# Patient Record
Sex: Male | Born: 1950 | Race: White | Hispanic: No | Marital: Married | Smoking: Never smoker
Health system: Southern US, Community
[De-identification: ages and names within clinical notes are randomized; demographics above are authoritative.]

## PROBLEM LIST (undated history)

## (undated) DIAGNOSIS — Z8673 Personal history of transient ischemic attack (TIA), and cerebral infarction without residual deficits: Secondary | ICD-10-CM

## (undated) DIAGNOSIS — I82409 Acute embolism and thrombosis of unspecified deep veins of unspecified lower extremity: Secondary | ICD-10-CM

## (undated) DIAGNOSIS — R591 Generalized enlarged lymph nodes: Secondary | ICD-10-CM

## (undated) DIAGNOSIS — K219 Gastro-esophageal reflux disease without esophagitis: Secondary | ICD-10-CM

## (undated) DIAGNOSIS — I10 Essential (primary) hypertension: Secondary | ICD-10-CM

## (undated) DIAGNOSIS — D649 Anemia, unspecified: Secondary | ICD-10-CM

## (undated) DIAGNOSIS — F32A Depression, unspecified: Secondary | ICD-10-CM

## (undated) DIAGNOSIS — G4730 Sleep apnea, unspecified: Secondary | ICD-10-CM

## (undated) DIAGNOSIS — I2510 Atherosclerotic heart disease of native coronary artery without angina pectoris: Secondary | ICD-10-CM

## (undated) DIAGNOSIS — I77819 Aortic ectasia, unspecified site: Secondary | ICD-10-CM

## (undated) DIAGNOSIS — D6859 Other primary thrombophilia: Secondary | ICD-10-CM

## (undated) DIAGNOSIS — I639 Cerebral infarction, unspecified: Secondary | ICD-10-CM

## (undated) DIAGNOSIS — I2699 Other pulmonary embolism without acute cor pulmonale: Secondary | ICD-10-CM

## (undated) DIAGNOSIS — E871 Hypo-osmolality and hyponatremia: Secondary | ICD-10-CM

## (undated) DIAGNOSIS — I209 Angina pectoris, unspecified: Secondary | ICD-10-CM

## (undated) DIAGNOSIS — I255 Ischemic cardiomyopathy: Secondary | ICD-10-CM

## (undated) DIAGNOSIS — I519 Heart disease, unspecified: Secondary | ICD-10-CM

## (undated) DIAGNOSIS — I5022 Chronic systolic (congestive) heart failure: Secondary | ICD-10-CM

## (undated) DIAGNOSIS — E785 Hyperlipidemia, unspecified: Secondary | ICD-10-CM

## (undated) DIAGNOSIS — F329 Major depressive disorder, single episode, unspecified: Secondary | ICD-10-CM

## (undated) DIAGNOSIS — R29898 Other symptoms and signs involving the musculoskeletal system: Secondary | ICD-10-CM

## (undated) DIAGNOSIS — R29818 Other symptoms and signs involving the nervous system: Secondary | ICD-10-CM

## (undated) HISTORY — PX: PROSTATE SURGERY: SHX751

## (undated) HISTORY — DX: Major depressive disorder, single episode, unspecified: F32.9

## (undated) HISTORY — DX: Gastro-esophageal reflux disease without esophagitis: K21.9

## (undated) HISTORY — PX: TONSILLECTOMY AND ADENOIDECTOMY: SUR1326

## (undated) HISTORY — DX: Depression, unspecified: F32.A

## (undated) HISTORY — DX: Heart disease, unspecified: I51.9

## (undated) HISTORY — PX: OTHER SURGICAL HISTORY: SHX169

## (undated) HISTORY — DX: Hyperlipidemia, unspecified: E78.5

## (undated) HISTORY — PX: APPENDECTOMY: SHX54

---

## 2014-07-29 DIAGNOSIS — Z23 Encounter for immunization: Secondary | ICD-10-CM | POA: Diagnosis not present

## 2014-07-29 DIAGNOSIS — I2510 Atherosclerotic heart disease of native coronary artery without angina pectoris: Secondary | ICD-10-CM | POA: Diagnosis not present

## 2014-07-29 DIAGNOSIS — E785 Hyperlipidemia, unspecified: Secondary | ICD-10-CM | POA: Diagnosis not present

## 2014-07-29 DIAGNOSIS — Z125 Encounter for screening for malignant neoplasm of prostate: Secondary | ICD-10-CM | POA: Diagnosis not present

## 2014-07-29 DIAGNOSIS — K219 Gastro-esophageal reflux disease without esophagitis: Secondary | ICD-10-CM | POA: Diagnosis not present

## 2014-07-29 DIAGNOSIS — Z79899 Other long term (current) drug therapy: Secondary | ICD-10-CM | POA: Diagnosis not present

## 2014-07-29 DIAGNOSIS — I10 Essential (primary) hypertension: Secondary | ICD-10-CM | POA: Diagnosis not present

## 2014-08-07 DIAGNOSIS — I2510 Atherosclerotic heart disease of native coronary artery without angina pectoris: Secondary | ICD-10-CM | POA: Diagnosis not present

## 2014-08-07 DIAGNOSIS — E782 Mixed hyperlipidemia: Secondary | ICD-10-CM | POA: Diagnosis not present

## 2014-08-07 DIAGNOSIS — I452 Bifascicular block: Secondary | ICD-10-CM | POA: Diagnosis not present

## 2014-08-07 DIAGNOSIS — R9439 Abnormal result of other cardiovascular function study: Secondary | ICD-10-CM | POA: Diagnosis not present

## 2014-08-07 DIAGNOSIS — I080 Rheumatic disorders of both mitral and aortic valves: Secondary | ICD-10-CM | POA: Diagnosis not present

## 2015-02-03 DIAGNOSIS — R7301 Impaired fasting glucose: Secondary | ICD-10-CM | POA: Diagnosis not present

## 2015-02-03 DIAGNOSIS — I10 Essential (primary) hypertension: Secondary | ICD-10-CM | POA: Diagnosis not present

## 2015-02-03 DIAGNOSIS — I2510 Atherosclerotic heart disease of native coronary artery without angina pectoris: Secondary | ICD-10-CM | POA: Diagnosis not present

## 2015-02-03 DIAGNOSIS — Z79899 Other long term (current) drug therapy: Secondary | ICD-10-CM | POA: Diagnosis not present

## 2015-02-03 DIAGNOSIS — E785 Hyperlipidemia, unspecified: Secondary | ICD-10-CM | POA: Diagnosis not present

## 2015-05-25 DIAGNOSIS — Z23 Encounter for immunization: Secondary | ICD-10-CM | POA: Diagnosis not present

## 2015-06-10 ENCOUNTER — Ambulatory Visit (INDEPENDENT_AMBULATORY_CARE_PROVIDER_SITE_OTHER): Payer: Medicare Other | Admitting: Family Medicine

## 2015-06-10 ENCOUNTER — Encounter: Payer: Self-pay | Admitting: Family Medicine

## 2015-06-10 VITALS — BP 126/83 | HR 70 | Temp 97.0°F | Ht 73.0 in | Wt 248.2 lb

## 2015-06-10 DIAGNOSIS — I10 Essential (primary) hypertension: Secondary | ICD-10-CM | POA: Diagnosis not present

## 2015-06-10 DIAGNOSIS — E785 Hyperlipidemia, unspecified: Secondary | ICD-10-CM | POA: Diagnosis not present

## 2015-06-10 DIAGNOSIS — K219 Gastro-esophageal reflux disease without esophagitis: Secondary | ICD-10-CM | POA: Diagnosis not present

## 2015-06-10 DIAGNOSIS — F32A Depression, unspecified: Secondary | ICD-10-CM | POA: Insufficient documentation

## 2015-06-10 DIAGNOSIS — F329 Major depressive disorder, single episode, unspecified: Secondary | ICD-10-CM | POA: Diagnosis not present

## 2015-06-10 DIAGNOSIS — M722 Plantar fascial fibromatosis: Secondary | ICD-10-CM

## 2015-06-10 MED ORDER — PREDNISONE 20 MG PO TABS: ORAL_TABLET | ORAL | Status: DC

## 2015-06-10 NOTE — Progress Notes (Signed)
BP 126/83 mmHg  Pulse 70  Temp(Src) 97 F (36.1 C) (Oral)  Ht 6\' 1"  (1.854 m)  Wt 248 lb 3.2 oz (112.583 kg)  BMI 32.75 kg/m2   Subjective:    Patient ID: [name], male    DOB: [birth year], 64 y.o.   MRN: [id number]  HPI: [name] is a 64 y.o. male presenting on 06/10/2015 for Foot Pain   HPI Hypertension Patient presents today for a hypertension check as a new patient to our clinic. Prior to coming to [place] he has been on Norvasc 5 mg and Diovan 320-12.5 mg. His blood pressure for him states pretty controlled on these medications. Patient denies headaches, blurred vision, chest pains, shortness of breath, or weakness. Denies any side effects from medication and is content with current medication.  GERD Patient has had bad reflux in the past that has been well-controlled with omeprazole. Denies any issues with bleeding ulcers or blood in stools or dark tarry stools. Denies any issues or abdominal pain currently.  Depression Patient has been on Lexapro and feels like he does very well on Lexapro. He denies any major side effects from it. He denies any suicidal ideations. He feels like his anxiety and depression are very well controlled while on this medication.  Hyperlipidemia Patient takes Lipitor for hyperlipidemia and does not have any issues with it. Has not had her check for cholesterol and would well and is ready for one today.  Foot pain Patient has pain in his left heel on the sole of his foot extending up into them middle of his foot. Occasionally with this pain he gets a little bit of shooting going up in his calf but that is not the major pain that he has been having. The majority is from the heel. He describes it as a sharp burning pain and is often 8 out of 10. This was been going on for 4-6 weeks. He has attempted Tylenol without much success.  Relevant past medical, surgical, family and social history reviewed and updated as indicated. Interim medical history  since our last visit reviewed. Allergies and medications reviewed and updated.  Review of Systems  Constitutional: Negative for fever.  HENT: Negative for ear discharge and ear pain.   Eyes: Negative for discharge and visual disturbance.  Respiratory: Negative for chest tightness, shortness of breath and wheezing.   Cardiovascular: Negative for chest pain and leg swelling.  Gastrointestinal: Negative for abdominal pain, diarrhea and constipation.  Genitourinary: Negative for difficulty urinating.  Musculoskeletal: Positive for arthralgias. Negative for myalgias, back pain, joint swelling and gait problem.  Skin: Negative for rash.  Neurological: Negative for dizziness, syncope, light-headedness and headaches.  Psychiatric/Behavioral: Negative for suicidal ideas, behavioral problems, sleep disturbance, self-injury, dysphoric mood and agitation. The patient is not nervous/anxious.   All other systems reviewed and are negative.   Per HPI unless specifically indicated above  Social History   Social History  . Marital Status: Married    Spouse Name: N/A  . Number of Children: N/A  . Years of Education: N/A   Occupational History  . Not on file.   Social History Main Topics  . Smoking status: Never Smoker   . Smokeless tobacco: Never Used  . Alcohol Use: Not on file     Comment: occasional  . Drug Use: No  . Sexual Activity: Yes    Birth Control/ Protection: Post-menopausal     Comment: Married for 16 years   Other Topics Concern  .  Not on file   Social History Narrative  . No narrative on file    Past Surgical History  Procedure Laterality Date  . Appendectomy    . Adnoidectomy    . Tonsillectomy and adenoidectomy    . Prostate surgery      Family History  Problem Relation Age of Onset  . COPD Mother   . Heart disease Mother   . AAA (abdominal aortic aneurysm) Mother   . Heart disease Father   . Depression Father   . Diabetes Father   . Heart disease Brother    . Early death Brother     heart attack      Medication List       This list is accurate as of: 06/10/15  9:37 AM.  Always use your most recent med list.               amLODipine 5 MG tablet  Commonly known as:  NORVASC  Take 5 mg by mouth daily.     atorvastatin 80 MG tablet  Commonly known as:  LIPITOR  Take 80 mg by mouth daily.     escitalopram 20 MG tablet  Commonly known as:  LEXAPRO  Take 20 mg by mouth daily.     omeprazole 20 MG capsule  Commonly known as:  PRILOSEC  Take 20 mg by mouth daily.     predniSONE 20 MG tablet  Commonly known as:  DELTASONE  2 po at same time daily for 5 days     valsartan-hydrochlorothiazide 320-12.5 MG tablet  Commonly known as:  DIOVAN-HCT  Take 1 tablet by mouth daily.           Objective:    BP 126/83 mmHg  Pulse 70  Temp(Src) 97 F (36.1 C) (Oral)  Ht 6\' 1"  (1.854 m)  Wt 248 lb 3.2 oz (112.583 kg)  BMI 32.75 kg/m2  Wt Readings from Last 3 Encounters:  06/10/15 248 lb 3.2 oz (112.583 kg)    Physical Exam  Constitutional: He is oriented to person, place, and time. He appears well-developed and well-nourished. No distress.  Eyes: Conjunctivae and EOM are normal. Pupils are equal, round, and reactive to light. Right eye exhibits no discharge. No scleral icterus.  Neck: Neck supple. No thyromegaly present.  Cardiovascular: Normal rate, regular rhythm, normal heart sounds and intact distal pulses.   No murmur heard. Pulmonary/Chest: Effort normal and breath sounds normal. No respiratory distress. He has no wheezes.  Musculoskeletal: Normal range of motion. He exhibits no edema.       Left foot: There is tenderness (tenderness on the plantar surface near the heel extending in the midline of the arch of the foot.). There is normal range of motion, no bony tenderness, no swelling, no crepitus, no deformity and no laceration.  Lymphadenopathy:    He has no cervical adenopathy.  Neurological: He is alert and oriented  to person, place, and time. Coordination normal.  Skin: Skin is warm and dry. No rash noted. He is not diaphoretic.  Psychiatric: He has a normal mood and affect. His behavior is normal.  Vitals reviewed.   No results found for this or any previous visit.    Assessment & Plan:   Problem List Items Addressed This Visit      Cardiovascular and Mediastinum   Essential hypertension, benign - Primary   Relevant Medications   valsartan-hydrochlorothiazide (DIOVAN-HCT) 320-12.5 MG tablet   amLODipine (NORVASC) 5 MG tablet   atorvastatin (LIPITOR) 80  MG tablet     Digestive   GERD (gastroesophageal reflux disease)   Relevant Medications   omeprazole (PRILOSEC) 20 MG capsule     Other   Depression   Relevant Medications   escitalopram (LEXAPRO) 20 MG tablet   Hyperlipidemia LDL goal <130   Relevant Medications   valsartan-hydrochlorothiazide (DIOVAN-HCT) 320-12.5 MG tablet   amLODipine (NORVASC) 5 MG tablet   atorvastatin (LIPITOR) 80 MG tablet    Other Visit Diagnoses    Plantar fasciitis of left foot        Relevant Medications    predniSONE (DELTASONE) 20 MG tablet        Follow up plan: Return in about 2 months (around 08/10/2015), or if symptoms worsen or fail to improve, for f/u htn/ depression.  Caryl Pina, MD [facility] 06/10/2015, 9:37 AM

## 2015-06-16 DIAGNOSIS — M79672 Pain in left foot: Secondary | ICD-10-CM | POA: Diagnosis not present

## 2015-06-16 DIAGNOSIS — M722 Plantar fascial fibromatosis: Secondary | ICD-10-CM | POA: Diagnosis not present

## 2015-07-07 DIAGNOSIS — M722 Plantar fascial fibromatosis: Secondary | ICD-10-CM | POA: Diagnosis not present

## 2015-07-14 ENCOUNTER — Ambulatory Visit: Payer: Medicare Other | Attending: Podiatry | Admitting: Physical Therapy

## 2015-07-14 DIAGNOSIS — M79672 Pain in left foot: Secondary | ICD-10-CM | POA: Insufficient documentation

## 2015-07-14 NOTE — Therapy (Signed)
[facility] [place], [place], [address] Phone: [phone]   Fax:  [phone]  Physical Therapy Evaluation  Patient Details  Name: [name] MRN: [id number] Date of Birth: 10-Dec-1950 Referring Provider: Vonna Kotyk Dettinger MD.  Encounter Date: 07/14/2015      PT End of Session - 07/14/15 1404    Visit Number 1   Number of Visits 12   Date for PT Re-Evaluation 09/01/15   PT Start Time 0110   PT Stop Time 0151   PT Time Calculation (min) 41 min   Activity Tolerance Patient tolerated treatment well   Behavior During Therapy [facility] for tasks assessed/performed      Past Medical History  Diagnosis Date  . Hyperlipidemia   . Hypertension   . Depression   . GERD (gastroesophageal reflux disease)   . Heart disease     some blockage in LAD    Past Surgical History  Procedure Laterality Date  . Appendectomy    . Adnoidectomy    . Tonsillectomy and adenoidectomy    . Prostate surgery      There were no vitals filed for this visit.  Visit Diagnosis:  Left foot pain - Plan: PT plan of care cert/re-cert      Subjective Assessment - 07/14/15 1411    Subjective No pain today but very painful prior to pain medication.   Limitations Walking   Patient Stated Goals Get out of pain.   Currently in Pain? No/denies            [facility] PT Assessment - 07/14/15 0001    Assessment   Medical Diagnosis Left plantar fasciitis.   Referring Provider Caryl Pina MD.   Onset Date/Surgical Date --  10 weeks.   Precautions   Precautions None   Restrictions   Weight Bearing Restrictions No   Balance Screen   Has the patient fallen in the past 6 months No   Has the patient had a decrease in activity level because of a fear of falling?  No   Is the patient reluctant to leave their home because of a fear of falling?  No   Home Ec[employer] residence   Prior Function   Level of Independence  Independent   Posture/Postural Control   Posture Comments Pronation   ROM / Strength   AROM / PROM / Strength AROM;Strength   AROM   Overall AROM Comments Normal left ankle range of motion.   Palpation   Palpation comment Tender to palpation over patient's left foot medial calcaneal tubercle and distal 1/3 of plantar fascia from this region.  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   Ambulation/Gait   Gait Comments Normal gait cycle.  He wears new balance with orthotics.                   [facility] Adult PT Treatment/Exercise - 07/14/15 0001    Modalities   Modalities Ultrasound   Ultrasound   Ultrasound Location Left foot medial calaneal region and plantar fascia   Ultrasound Parameters 1.50 W/CM2 x 8 minutes.   Ultrasound Goals Pain   Manual Therapy   Manual therapy comments IASTM x 8 minutes.                  PT Short Term Goals - 07/14/15 1421    PT SHORT TERM GOAL #1   Title Ind with a HEP.   Time 2  Period Weeks   Status New           PT Long Term Goals - 07/18/2015 1421    PT LONG TERM GOAL #1   Title Walk a community distance with pain not > 2-3/10.   Time 6   Period Weeks   Status New               Plan - 07/18/15 1419    Clinical Impression Statement The patient has had ongoing and worsening left foot pain over the last 10 minths.  Prior to medication his foot pain would easily rise to 7+/10.   Pt will benefit from skilled therapeutic intervention in order to improve on the following deficits Pain;Decreased activity tolerance   Rehab Potential Excellent   PT Frequency 2x / week   PT Duration 6 weeks   PT Treatment/Interventions Therapeutic exercise;Manual techniques;Ultrasound   PT Next Visit Plan IASTM; U/S; Rockerboard; dynadisc.          G-Codes - 07/18/15 1422    Functional Assessment Tool Used FOTO--46% limitation.   Functional Limitation Mobility: Walking and moving around   Mobility: Walking and  Moving Around Current Status [phone]) At least 40 percent but less than 60 percent impaired, limited or restricted       Problem List Patient Active Problem List   Diagnosis Date Noted  . Essential hypertension, benign 06/10/2015  . Depression 06/10/2015  . Hyperlipidemia LDL goal <130 06/10/2015  . GERD (gastroesophageal reflux disease) 06/10/2015    Clancey Welton, [place] MPT 18-Jul-2015, 2:24 PM  [facility] [place], [place], [address] Phone: [phone]   Fax:  [phone]  Name: [name] MRN: [id number] Date of Birth: 11/23/1950

## 2015-07-21 ENCOUNTER — Ambulatory Visit: Payer: Medicare Other | Admitting: *Deleted

## 2015-07-21 DIAGNOSIS — M79672 Pain in left foot: Secondary | ICD-10-CM | POA: Diagnosis not present

## 2015-07-21 NOTE — Therapy (Signed)
[facility] [place], [place], [address] Phone: [phone]   Fax:  [phone]  Physical Therapy Treatment  Patient Details  Name: [name] MRN: [id number] Date of Birth: 10-Jun-1951 Referring Provider: Vonna Kotyk Dettinger MD.  Encounter Date: 07/21/2015      PT End of Session - 07/21/15 1206    Visit Number 2   Number of Visits 12   Date for PT Re-Evaluation 09/01/15   PT Start Time 1115   PT Stop Time 1207   PT Time Calculation (min) 52 min   Activity Tolerance Patient tolerated treatment well   Behavior During Therapy [facility] for tasks assessed/performed      Past Medical History  Diagnosis Date  . Hyperlipidemia   . Hypertension   . Depression   . GERD (gastroesophageal reflux disease)   . Heart disease     some blockage in LAD    Past Surgical History  Procedure Laterality Date  . Appendectomy    . Adnoidectomy    . Tonsillectomy and adenoidectomy    . Prostate surgery      There were no vitals filed for this visit.  Visit Diagnosis:  Left foot pain      Subjective Assessment - 07/21/15 1113    Subjective pain is 3-4/10 today LT foot   Limitations Walking   Patient Stated Goals Get out of pain.                         [facility] Adult PT Treatment/Exercise - 07/21/15 0001    Bed Mobility   Bed Mobility Supine to Sit   Exercises   Exercises Ankle   Modalities   Modalities Ultrasound   Ultrasound   Ultrasound Location LT foot plantar fascia   Ultrasound Parameters 1.5 w/cm2 x12 mins while on and off stretch supine   Ultrasound Goals Pain   Manual Therapy   Manual therapy comments IASTM/ STW to LT foot entire plantar fascia and around heel x 22 minutes.   Ankle Exercises: Standing   Rocker Board 5 minutes  Calf stretching                  PT Short Term Goals - 07/14/15 1421    PT SHORT TERM GOAL #1   Title Ind with a HEP.   Time 2   Period Weeks   Status New            PT Long Term Goals - 07/14/15 1421    PT LONG TERM GOAL #1   Title Walk a community distance with pain not > 2-3/10.   Time 6   Period Weeks   Status New               Plan - 07/21/15 1210    Clinical Impression Statement Pt did great with Rx today, but was very sore along entire medial border of Plantar fascia. He also did well with Calf stretching. Goals are ongoing   Pt will benefit from skilled therapeutic intervention in order to improve on the following deficits Pain;Decreased activity tolerance   Rehab Potential Excellent   PT Frequency 2x / week   PT Duration 6 weeks   PT Treatment/Interventions Therapeutic exercise;Manual techniques;Ultrasound   PT Next Visit Plan IASTM; U/S; Rockerboard; dynadisc.   Consulted and Agree with Plan of Care Patient        Problem List Patient Active Problem List   Diagnosis Date Noted  .  Essential hypertension, benign 06/10/2015  . Depression 06/10/2015  . Hyperlipidemia LDL goal <130 06/10/2015  . GERD (gastroesophageal reflux disease) 06/10/2015    RAMSEUR,CHRIS , PTA  07/21/2015, 12:12 PM  [facility] [address] [place], [place], [address] Phone: [phone]   Fax:  [phone]  Name: [name] MRN: [id number] Date of Birth: [birth year]

## 2015-07-28 ENCOUNTER — Encounter: Payer: Self-pay | Admitting: *Deleted

## 2015-07-28 ENCOUNTER — Ambulatory Visit: Payer: Medicare Other | Attending: Podiatry | Admitting: *Deleted

## 2015-07-28 DIAGNOSIS — M79672 Pain in left foot: Secondary | ICD-10-CM | POA: Diagnosis not present

## 2015-07-28 NOTE — Therapy (Signed)
[facility] [place], [place], [address] Phone: [phone]   Fax:  [phone]  Physical Therapy Treatment  Patient Details  Name: [name] MRN: [id number] Date of Birth: 1951-07-26 Referring Provider: Vonna Kotyk Dettinger MD.  Encounter Date: 07/28/2015      PT End of Session - 07/28/15 1123    Visit Number 3   Number of Visits 12   Date for PT Re-Evaluation 09/01/15   PT Start Time 1115   PT Stop Time 1202   PT Time Calculation (min) 47 min      Past Medical History  Diagnosis Date  . Hyperlipidemia   . Hypertension   . Depression   . GERD (gastroesophageal reflux disease)   . Heart disease     some blockage in LAD    Past Surgical History  Procedure Laterality Date  . Appendectomy    . Adnoidectomy    . Tonsillectomy and adenoidectomy    . Prostate surgery      There were no vitals filed for this visit.  Visit Diagnosis:  Left foot pain                       [facility] Adult PT Treatment/Exercise - 07/28/15 0001    Exercises   Exercises Ankle   Modalities   Modalities Ultrasound   Ultrasound   Ultrasound Location LT foot    Ultrasound Parameters 1.5 w/cm2 x 10 mins   Ultrasound Goals Pain   Manual Therapy   Manual therapy comments IASTM/ STW to LT foot entire plantar fascia and around heel.   Ankle Exercises: Aerobic   Stationary Bike nustep L5 x 10 mins   Ankle Exercises: Standing   Rocker Board 5 minutes                  PT Short Term Goals - 07/14/15 1421    PT SHORT TERM GOAL #1   Title Ind with a HEP.   Time 2   Period Weeks   Status New           PT Long Term Goals - 07/14/15 1421    PT LONG TERM GOAL #1   Title Walk a community distance with pain not > 2-3/10.   Time 6   Period Weeks   Status New               Plan - 07/28/15 1206    Clinical Impression Statement Pt did well with Rx and was able to tolerate manual STW  to LT plantar  fascia. He was still tender and tight along entire medial border, but thinks PT is helpiing   Pt will benefit from skilled therapeutic intervention in order to improve on the following deficits Pain;Decreased activity tolerance   Rehab Potential Excellent   PT Frequency 2x / week   PT Duration 6 weeks   PT Treatment/Interventions Therapeutic exercise;Manual techniques;Ultrasound   PT Next Visit Plan IASTM; U/S; Rockerboard; dynadisc.   Consulted and Agree with Plan of Care Patient        Problem List Patient Active Problem List   Diagnosis Date Noted  . Essential hypertension, benign 06/10/2015  . Depression 06/10/2015  . Hyperlipidemia LDL goal <130 06/10/2015  . GERD (gastroesophageal reflux disease) 06/10/2015    Jarita Raval,CHRIS, PTA 07/28/2015, 12:23 PM  [facility] [place], [place], [address] Phone: [phone]   Fax:  [phone]  Name: Faheem Clyde MRN: [id number]  Date of Birth: 12-26-1950

## 2015-07-31 ENCOUNTER — Encounter: Payer: Medicare Other | Admitting: Physical Therapy

## 2015-07-31 ENCOUNTER — Ambulatory Visit: Payer: Medicare Other | Admitting: Physical Therapy

## 2015-07-31 DIAGNOSIS — M79672 Pain in left foot: Secondary | ICD-10-CM

## 2015-07-31 NOTE — Therapy (Signed)
[facility] [place], [place], [address] Phone: [phone]   Fax:  [phone]  Physical Therapy Treatment  Patient Details  Name: [name] MRN: [id number] Date of Birth: [birth year] Referring Provider: Vonna Kotyk Dettinger MD.  Encounter Date: 07/31/2015      PT End of Session - 07/31/15 1239    Visit Number 4   Number of Visits 12   Date for PT Re-Evaluation 09/01/15   PT Start Time 1114   PT Stop Time 1159   PT Time Calculation (min) 45 min   Activity Tolerance Patient tolerated treatment well   Behavior During Therapy [facility] for tasks assessed/performed      Past Medical History  Diagnosis Date  . Hyperlipidemia   . Hypertension   . Depression   . GERD (gastroesophageal reflux disease)   . Heart disease     some blockage in LAD    Past Surgical History  Procedure Laterality Date  . Appendectomy    . Adnoidectomy    . Tonsillectomy and adenoidectomy    . Prostate surgery      There were no vitals filed for this visit.  Visit Diagnosis:  Left foot pain      Subjective Assessment - 07/31/15 1238    Subjective My foot is much better overall.   Limitations Walking   Patient Stated Goals Get out of pain.   Currently in Pain? Yes   Pain Score 2    Pain Location Foot   Pain Orientation Left   Pain Descriptors / Indicators Aching   Pain Onset More than a month ago   Pain Frequency Intermittent                         [facility] Adult PT Treatment/Exercise - 07/31/15 0001    Modalities   Modalities Ultrasound   Ultrasound   Ultrasound Location --  LT foot plantar surface.   Ultrasound Parameters 1.50 W/CM2 x 8 minutes    Ultrasound Goals Pain   Manual Therapy   Manual therapy comments IASTM/STW/M x 27 minutes   Ankle Exercises: Standing   Rocker Board 4 minutes                  PT Short Term Goals - 07/14/15 1421    PT SHORT TERM GOAL #1   Title Ind with a HEP.   Time 2   Period Weeks   Status New           PT Long Term Goals - 07/14/15 1421    PT LONG TERM GOAL #1   Title Walk a community distance with pain not > 2-3/10.   Time 6   Period Weeks   Status New               Problem List Patient Active Problem List   Diagnosis Date Noted  . Essential hypertension, benign 06/10/2015  . Depression 06/10/2015  . Hyperlipidemia LDL goal <130 06/10/2015  . GERD (gastroesophageal reflux disease) 06/10/2015    APPLEGATE, [place] MPT 07/31/2015, 12:53 PM  [facility] [place], [place], [address] Phone: [phone]   Fax:  [phone]  Name: [name] MRN: [id number] Date of Birth: 11-23-1950

## 2015-08-04 ENCOUNTER — Other Ambulatory Visit: Payer: Self-pay | Admitting: Family Medicine

## 2015-08-04 MED ORDER — OMEPRAZOLE 20 MG PO CPDR: 20.0000 mg | DELAYED_RELEASE_CAPSULE | Freq: Every day | ORAL | Status: DC

## 2015-08-04 NOTE — Telephone Encounter (Signed)
done

## 2015-08-05 ENCOUNTER — Ambulatory Visit: Payer: Medicare Other | Admitting: Physical Therapy

## 2015-08-05 DIAGNOSIS — M79672 Pain in left foot: Secondary | ICD-10-CM

## 2015-08-05 NOTE — Therapy (Signed)
[facility] [place], [place], [address] Phone: [phone]   Fax:  [phone]  Physical Therapy Treatment  Patient Details  Name: [name] MRN: [id number] Date of Birth: 09-01-1950 Referring Provider: Vonna Kotyk Dettinger MD.  Encounter Date: 08/05/2015      PT End of Session - 08/05/15 0901    Visit Number 5   Number of Visits 12   Date for PT Re-Evaluation 09/01/15   PT Start Time 0901   PT Stop Time 0942   PT Time Calculation (min) 41 min   Activity Tolerance Patient tolerated treatment well   Behavior During Therapy [facility] for tasks assessed/performed      Past Medical History  Diagnosis Date  . Hyperlipidemia   . Hypertension   . Depression   . GERD (gastroesophageal reflux disease)   . Heart disease     some blockage in LAD    Past Surgical History  Procedure Laterality Date  . Appendectomy    . Adnoidectomy    . Tonsillectomy and adenoidectomy    . Prostate surgery      There were no vitals filed for this visit.  Visit Diagnosis:  Left foot pain      Subjective Assessment - 08/05/15 0901    Subjective Patient was walking in [employer] 08/04/15 and heard a pop and felt pain in the arch of his L foot. Patient went home and iced it.  Patient stated he had gone off his Diflonec prior to this, but started it again after it happened.   Patient Stated Goals Get out of pain.   Currently in Pain? Yes   Pain Score 6    Pain Location Foot   Pain Orientation Left   Pain Descriptors / Indicators Aching                         [facility] Adult PT Treatment/Exercise - 08/05/15 0001    Modalities   Modalities Ultrasound   Ultrasound   Ultrasound Location L plantar fascia   Ultrasound Parameters 1.5 Wcm2 1 mhz cont x 10 min   Ultrasound Goals Pain   Manual Therapy   Manual therapy comments IASTM/STW/M x 20 minutes                  PT Short Term Goals - 07/14/15 1421    PT SHORT  TERM GOAL #1   Title Ind with a HEP.   Time 2   Period Weeks   Status New           PT Long Term Goals - 07/14/15 1421    PT LONG TERM GOAL #1   Title Walk a community distance with pain not > 2-3/10.   Time 6   Period Weeks   Status New               Plan - 08/05/15 [id number]    Clinical Impression Statement Patient experiencing increased pain today after hearing pop and pain 08/04/15 when walking. He has palpable nodules in L plantar fascia with pain. Held TE due to increased pain today. Responded well to treatement. Pain down to 4-5/10 at end of treatment.   PT Next Visit Plan MD note for 08/11/15 appt. Continue [place], STW and TE.        Problem List Patient Active Problem List   Diagnosis Date Noted  . Essential hypertension, benign 06/10/2015  . Depression 06/10/2015  . Hyperlipidemia LDL goal <130 06/10/2015  .  GERD (gastroesophageal reflux disease) 06/10/2015   Madelyn Flavors PT  08/05/2015, 12:27 PM  [facility] [address] [place], [place], [address] Phone: [phone]   Fax:  [phone]  Name: [name] MRN: [id number] Date of Birth: 1951/07/24

## 2015-08-07 ENCOUNTER — Ambulatory Visit: Payer: Medicare Other | Admitting: Physical Therapy

## 2015-08-07 ENCOUNTER — Encounter: Payer: Self-pay | Admitting: Physical Therapy

## 2015-08-07 DIAGNOSIS — M79672 Pain in left foot: Secondary | ICD-10-CM | POA: Diagnosis not present

## 2015-08-07 NOTE — Therapy (Signed)
[facility] [place], [place], [address] Phone: [phone]   Fax:  [phone]  Physical Therapy Treatment  Patient Details  Name: Rakeim Warhurst MRN: [id number] Date of Birth: [birth year] Referring Provider: Vonna Kotyk Dettinger MD.  Encounter Date: 08/07/2015      PT End of Session - 08/07/15 1036    Visit Number 6   Number of Visits 12   Date for PT Re-Evaluation 09/01/15   PT Start Time 1036   PT Stop Time 1118   PT Time Calculation (min) 42 min   Activity Tolerance Patient tolerated treatment well   Behavior During Therapy [facility] for tasks assessed/performed      Past Medical History  Diagnosis Date  . Hyperlipidemia   . Hypertension   . Depression   . GERD (gastroesophageal reflux disease)   . Heart disease     some blockage in LAD    Past Surgical History  Procedure Laterality Date  . Appendectomy    . Adnoidectomy    . Tonsillectomy and adenoidectomy    . Prostate surgery      There were no vitals filed for this visit.  Visit Diagnosis:  Left foot pain      Subjective Assessment - 08/07/15 1034    Subjective States that he has been having electrical shocks in L foot. Just received blue superfeet inserts and has the inserts in his shoes currently.   Limitations Walking   Patient Stated Goals Get out of pain.   Currently in Pain? Yes   Pain Score 3    Pain Location Foot   Pain Orientation Left   Pain Descriptors / Indicators Shooting   Pain Onset More than a month ago            [facility] PT Assessment - 08/07/15 0001    Assessment   Medical Diagnosis Left plantar fasciitis.   Next MD Visit 08/11/2015   Precautions   Precautions None                     [facility] Adult PT Treatment/Exercise - 08/07/15 0001    Modalities   Modalities Ultrasound   Ultrasound   Ultrasound Location L medial calcaneal region/ Plantar Fascia   Ultrasound Parameters 1.5 w/cm2, 100%, 1 mhz x10 min    Ultrasound Goals Pain   Manual Therapy   Manual Therapy Myofascial release   Myofascial Release IASTW/MFR to L Plantar Fascia to decrease tightness and pain                  PT Short Term Goals - 07/14/15 1421    PT SHORT TERM GOAL #1   Title Ind with a HEP.   Time 2   Period Weeks   Status New           PT Long Term Goals - 07/14/15 1421    PT LONG TERM GOAL #1   Title Walk a community distance with pain not > 2-3/10.   Time 6   Period Weeks   Status New               Plan - 08/07/15 1147    Clinical Impression Statement Patient tolerated today's treatment well with only 2-3 instances of patient reporting that he "felt it" during manual therapy. Continues to have palpable nodules present in L plantar fascia (two proximal to the first metatarsal head and another is inferiolateral to the first metatarsal head.) Tightness was noted in the mid  L plantar fascia during palpation. Patient was encouraged to try alternating between frozen water bottle massage and heat at home over the weekend as well as assessing his response to the new blue superfeet orthotics he had received recently. Normal [place] response noted following end of the [place] session. Experienced 2-3/10 L foot pain following today's session per patient report.   Pt will benefit from skilled therapeutic intervention in order to improve on the following deficits Pain;Decreased activity tolerance   Rehab Potential Excellent   PT Frequency 2x / week   PT Duration 6 weeks   PT Treatment/Interventions Therapeutic exercise;Manual techniques;Ultrasound   PT Next Visit Plan MD note for 08/11/15 appt. Continue [place], STW and TE.   Consulted and Agree with Plan of Care Patient        Problem List Patient Active Problem List   Diagnosis Date Noted  . Essential hypertension, benign 06/10/2015  . Depression 06/10/2015  . Hyperlipidemia LDL goal <130 06/10/2015  . GERD (gastroesophageal reflux disease) 06/10/2015     Wynelle Fanny, PTA 08/07/2015, 12:03 PM  [facility] [place], [place], [address] Phone: [phone]   Fax:  [phone]  Name: Gates Demoulin MRN: [id number] Date of Birth: [birth year]

## 2015-08-10 ENCOUNTER — Ambulatory Visit (INDEPENDENT_AMBULATORY_CARE_PROVIDER_SITE_OTHER): Payer: Medicare Other | Admitting: Family Medicine

## 2015-08-10 ENCOUNTER — Encounter: Payer: Self-pay | Admitting: Family Medicine

## 2015-08-10 VITALS — BP 130/86 | HR 80 | Temp 97.4°F | Ht 73.0 in | Wt 251.2 lb

## 2015-08-10 DIAGNOSIS — M19011 Primary osteoarthritis, right shoulder: Secondary | ICD-10-CM

## 2015-08-10 DIAGNOSIS — F32A Depression, unspecified: Secondary | ICD-10-CM

## 2015-08-10 DIAGNOSIS — I2510 Atherosclerotic heart disease of native coronary artery without angina pectoris: Secondary | ICD-10-CM | POA: Diagnosis not present

## 2015-08-10 DIAGNOSIS — I10 Essential (primary) hypertension: Secondary | ICD-10-CM | POA: Diagnosis not present

## 2015-08-10 DIAGNOSIS — F329 Major depressive disorder, single episode, unspecified: Secondary | ICD-10-CM | POA: Diagnosis not present

## 2015-08-10 MED ORDER — DICLOFENAC SODIUM 75 MG PO TBEC: 75.0000 mg | DELAYED_RELEASE_TABLET | Freq: Two times a day (BID) | ORAL | Status: DC

## 2015-08-10 NOTE — Progress Notes (Signed)
BP 130/86 mmHg  Pulse 80  Temp(Src) 97.4 F (36.3 C) (Oral)  Ht 6\' 1"  (1.854 m)  Wt 251 lb 3.2 oz (113.944 kg)  BMI 33.15 kg/m2   Subjective:    Patient ID: [name], male    DOB: [birth year], 64 y.o.   MRN: [id number]  HPI: [name] is a 64 y.o. male presenting on 08/10/2015 for Hypertension; Hyperlipidemia; and Refill Voltaren   HPI Hypertension Patient is currently taking amlodipine 5 mg and Diovan 320-12.5. His blood pressure today is 130/86. It seems that he is controlled. Patient denies headaches, blurred vision, chest pains, shortness of breath, or weakness. Denies any side effects from medication and is content with current medication.   Depression Patient has been on Lexapro prior to seeing me and has been continued on Lexapro 20 mg. She feels he is doing very well on it and does not feel like he needs to change. He is mostly sleeping at night. He denies any suicidal ideations. He denies any thoughts of hurting himself or hopelessness or helplessness.  Right shoulder pain Patient has been having right shoulder pain for about a year off and on but more recently has been worse. He does have some trouble lifting his arm above about 95 degrees.  History of heart troubles Patient has a known history of heart troubles and heart disease. He has seen a cardiologist before and would like to referral back to her cardiologist now that he lives here. He denies any current issues with chest pain.  Relevant past medical, surgical, family and social history reviewed and updated as indicated. Interim medical history since our last visit reviewed. Allergies and medications reviewed and updated.  Review of Systems  Constitutional: Negative for fever and chills.  HENT: Negative for ear discharge and ear pain.   Eyes: Negative for discharge and visual disturbance.  Respiratory: Negative for shortness of breath and wheezing.   Cardiovascular: Negative for chest pain and leg  swelling.  Gastrointestinal: Negative for abdominal pain, diarrhea and constipation.  Genitourinary: Negative for difficulty urinating.  Musculoskeletal: Positive for arthralgias. Negative for back pain, joint swelling and gait problem.  Skin: Negative for rash.  Neurological: Negative for dizziness, syncope, light-headedness and headaches.  All other systems reviewed and are negative.   Per HPI unless specifically indicated above     Medication List       This list is accurate as of: 08/10/15  8:45 AM.  Always use your most recent med list.               amLODipine 5 MG tablet  Commonly known as:  NORVASC  Take 5 mg by mouth daily.     atorvastatin 80 MG tablet  Commonly known as:  LIPITOR  Take 80 mg by mouth daily.     diclofenac 75 MG EC tablet  Commonly known as:  VOLTAREN  Take 1 tablet (75 mg total) by mouth 2 (two) times daily.     escitalopram 20 MG tablet  Commonly known as:  LEXAPRO  Take 20 mg by mouth daily.     omeprazole 20 MG capsule  Commonly known as:  PRILOSEC  Take 1 capsule (20 mg total) by mouth daily.     valsartan-hydrochlorothiazide 320-12.5 MG tablet  Commonly known as:  DIOVAN-HCT  Take 1 tablet by mouth daily.           Objective:    BP 130/86 mmHg  Pulse 80  Temp(Src) 97.4 F (36.3 C) (  Oral)  Ht 6\' 1"  (1.854 m)  Wt 251 lb 3.2 oz (113.944 kg)  BMI 33.15 kg/m2  Wt Readings from Last 3 Encounters:  08/10/15 251 lb 3.2 oz (113.944 kg)  06/10/15 248 lb 3.2 oz (112.583 kg)    Physical Exam  Constitutional: He is oriented to person, place, and time. He appears well-developed and well-nourished. No distress.  Eyes: Conjunctivae and EOM are normal. Pupils are equal, round, and reactive to light. Right eye exhibits no discharge. No scleral icterus.  Neck: Neck supple. No thyromegaly present.  Cardiovascular: Normal rate, regular rhythm, normal heart sounds and intact distal pulses.   No murmur heard. Pulmonary/Chest: Effort  normal and breath sounds normal. No respiratory distress. He has no wheezes.  Musculoskeletal: He exhibits no edema.       Right shoulder: He exhibits decreased range of motion (cannot lift his shoulder past about 95), tenderness (tenderness over AC joint), bony tenderness and crepitus. He exhibits no swelling, no effusion, no deformity and normal strength.  Lymphadenopathy:    He has no cervical adenopathy.  Neurological: He is alert and oriented to person, place, and time. Coordination normal.  Skin: Skin is warm and dry. No rash noted. He is not diaphoretic.  Psychiatric: His behavior is normal. Judgment and thought content normal. His mood appears not anxious. His affect is not blunt, not labile and not inappropriate. Cognition and memory are normal. He does not exhibit a depressed mood.  Vitals reviewed.   No results found for this or any previous visit.    Assessment & Plan:   Problem List Items Addressed This Visit      Cardiovascular and Mediastinum   Essential hypertension, benign    Continue current medications        Other   Depression    Continue medications       Other Visit Diagnoses    Primary osteoarthritis of right shoulder    -  Primary     give Voltaren gel, did not want to go see an orthopedic yet    Relevant Medications    diclofenac (VOLTAREN) 75 MG EC tablet    Atherosclerosis of native coronary artery of native heart without angina pectoris        Relevant Orders    Ambulatory referral to Cardiology        Follow up plan: Return in about 3 months (around 11/08/2015), or if symptoms worsen or fail to improve, for Depression f/u.  Counseling provided for all of the vaccine components No orders of the defined types were placed in this encounter.    Caryl Pina, MD [facility] 08/10/2015, 8:45 AM

## 2015-08-11 ENCOUNTER — Ambulatory Visit: Payer: Medicare Other | Admitting: Physical Therapy

## 2015-08-11 ENCOUNTER — Encounter: Payer: Self-pay | Admitting: Physical Therapy

## 2015-08-11 DIAGNOSIS — M79672 Pain in left foot: Secondary | ICD-10-CM

## 2015-08-11 DIAGNOSIS — M722 Plantar fascial fibromatosis: Secondary | ICD-10-CM | POA: Diagnosis not present

## 2015-08-11 NOTE — Therapy (Signed)
[facility] [place], [place], [address] Phone: [phone]   Fax:  [phone]  Physical Therapy Treatment  Patient Details  Name: [name] MRN: [id number] Date of Birth: Mar 19, 1951 Referring Provider: Vonna Kotyk Dettinger MD.  Encounter Date: 08/11/2015      PT End of Session - 08/11/15 0950    Visit Number 7   Number of Visits 12   Date for PT Re-Evaluation 09/01/15   PT Start Time 0950   PT Stop Time 1029   PT Time Calculation (min) 39 min   Activity Tolerance Patient tolerated treatment well   Behavior During Therapy [facility] for tasks assessed/performed      Past Medical History  Diagnosis Date  . Hyperlipidemia   . Hypertension   . Depression   . GERD (gastroesophageal reflux disease)   . Heart disease     some blockage in LAD    Past Surgical History  Procedure Laterality Date  . Appendectomy    . Adnoidectomy    . Tonsillectomy and adenoidectomy    . Prostate surgery      There were no vitals filed for this visit.  Visit Diagnosis:  Left foot pain      Subjective Assessment - 08/11/15 0949    Subjective Reports that inserts seem to be helping.   Limitations Walking   Patient Stated Goals Get out of pain.   Currently in Pain? Yes   Pain Score 3    Pain Location Foot   Pain Orientation Left   Pain Descriptors / Indicators Sore   Pain Onset More than a month ago            [facility] PT Assessment - 08/11/15 0001    Assessment   Medical Diagnosis Left plantar fasciitis.   Next MD Visit 08/11/2015                     [facility] Adult PT Treatment/Exercise - 08/11/15 0001    Modalities   Modalities Ultrasound   Ultrasound   Ultrasound Location L medial calcaneal region and plantar fascia   Ultrasound Parameters 1.5 w/cm2, 100%,1 mhz x10 min   Ultrasound Goals Pain   Manual Therapy   Manual Therapy Myofascial release   Myofascial Release IASTW/MFR to L Plantar Fascia to  decrease tightness and pain                  PT Short Term Goals - 07/14/15 1421    PT SHORT TERM GOAL #1   Title Ind with a HEP.   Time 2   Period Weeks   Status New           PT Long Term Goals - 07/14/15 1421    PT LONG TERM GOAL #1   Title Walk a community distance with pain not > 2-3/10.   Time 6   Period Weeks   Status New               Plan - 08/11/15 1037    Clinical Impression Statement Patient tolerated today's treatment fairly well although he reported soreness in mid plantar fascia and said he could "feel it" more frequently today during manual therapy. The palpable nodules in the L plantar fascia seemed more firm today than in previous treatment. Tightness was noted more in the mid plantar fascia where the patient expereinced soreness. Normal [place] response noted following end of [place] session. Denied pain following today's treatment.   Pt will benefit  from skilled therapeutic intervention in order to improve on the following deficits Pain;Decreased activity tolerance   Rehab Potential Excellent   PT Frequency 2x / week   PT Duration 6 weeks   PT Treatment/Interventions Therapeutic exercise;Manual techniques;Ultrasound   PT Next Visit Plan MD note for 08/11/15 appt. Continue [place], STW and TE.   Consulted and Agree with Plan of Care Patient        Problem List Patient Active Problem List   Diagnosis Date Noted  . Essential hypertension, benign 06/10/2015  . Depression 06/10/2015  . Hyperlipidemia LDL goal <130 06/10/2015  . GERD (gastroesophageal reflux disease) 06/10/2015    Ahmed Prima, PTA 08/11/2015 10:41 AM Mali Applegate MPT [facility] [address] [place], [place], [address] Phone: [phone]   Fax:  [phone]  Name: [name] MRN: [id number] Date of Birth: 1951/04/28

## 2015-08-12 ENCOUNTER — Encounter: Payer: Self-pay | Admitting: Cardiovascular Disease

## 2015-08-13 NOTE — Assessment & Plan Note (Signed)
Continue current medications. 

## 2015-08-13 NOTE — Assessment & Plan Note (Addendum)
Continue medications

## 2015-08-14 ENCOUNTER — Ambulatory Visit: Payer: Medicare Other | Admitting: Physical Therapy

## 2015-08-14 DIAGNOSIS — M79672 Pain in left foot: Secondary | ICD-10-CM | POA: Diagnosis not present

## 2015-08-14 NOTE — Therapy (Signed)
[facility] [place], [place], [address] Phone: [phone]   Fax:  [phone]  Physical Therapy Treatment  Patient Details  Name: [name] MRN: [id number] Date of Birth: 06/22/1951 Referring Provider: Vonna Kotyk Dettinger MD.  Encounter Date: 08/14/2015      PT End of Session - 08/14/15 1109    Visit Number 8   Number of Visits 12   Date for PT Re-Evaluation 09/01/15   PT Start Time 0900   PT Stop Time 0948   PT Time Calculation (min) 48 min      Past Medical History  Diagnosis Date  . Hyperlipidemia   . Hypertension   . Depression   . GERD (gastroesophageal reflux disease)   . Heart disease     some blockage in LAD    Past Surgical History  Procedure Laterality Date  . Appendectomy    . Adnoidectomy    . Tonsillectomy and adenoidectomy    . Prostate surgery      There were no vitals filed for this visit.  Visit Diagnosis:  Left foot pain      Subjective Assessment - 08/14/15 1111    Subjective i'm at least 60% better.   Limitations Walking   Patient Stated Goals Get out of pain.   Pain Score 3    Pain Location Foot   Pain Orientation Left   Pain Descriptors / Indicators Sore   Pain Onset More than a month ago   Pain Frequency Intermittent                         [facility] Adult PT Treatment/Exercise - 08/14/15 0001    Ultrasound   Ultrasound Location left affected plantar surface.   Ultrasound Parameters 1.50 W/M2 x 12 minutes.   Manual Therapy   Myofascial Release IASTM/W x 26 minutes.                  PT Short Term Goals - 07/14/15 1421    PT SHORT TERM GOAL #1   Title Ind with a HEP.   Time 2   Period Weeks   Status New           PT Long Term Goals - 07/14/15 1421    PT LONG TERM GOAL #1   Title Walk a community distance with pain not > 2-3/10.   Time 6   Period Weeks   Status New               Problem List Patient Active Problem  List   Diagnosis Date Noted  . Essential hypertension, benign 06/10/2015  . Depression 06/10/2015  . Hyperlipidemia LDL goal <130 06/10/2015  . GERD (gastroesophageal reflux disease) 06/10/2015    [name], [place] MPT 08/14/2015, 11:22 AM  [facility] [place], [place], [address] Phone: [phone]   Fax:  [phone]  Name: [name] MRN: [id number] Date of Birth: [birth year]

## 2015-08-18 ENCOUNTER — Encounter: Payer: Self-pay | Admitting: *Deleted

## 2015-08-18 ENCOUNTER — Ambulatory Visit: Payer: Medicare Other | Admitting: *Deleted

## 2015-08-18 DIAGNOSIS — M79672 Pain in left foot: Secondary | ICD-10-CM | POA: Diagnosis not present

## 2015-08-18 NOTE — Therapy (Signed)
[facility] [place], [place], [address] Phone: [phone]   Fax:  [phone]  Physical Therapy Treatment  Patient Details  Name: [name] MRN: [id number] Date of Birth: 05/23/1951 Referring Provider: Vonna Kotyk Dettinger MD.  Encounter Date: 08/18/2015      PT End of Session - 08/18/15 0908    Visit Number 9   Number of Visits 12   Date for PT Re-Evaluation 09/01/15   PT Start Time 0900   PT Stop Time [id number]   PT Time Calculation (min) 47 min      Past Medical History  Diagnosis Date  . Hyperlipidemia   . Hypertension   . Depression   . GERD (gastroesophageal reflux disease)   . Heart disease     some blockage in LAD    Past Surgical History  Procedure Laterality Date  . Appendectomy    . Adnoidectomy    . Tonsillectomy and adenoidectomy    . Prostate surgery      There were no vitals filed for this visit.  Visit Diagnosis:  Left foot pain      Subjective Assessment - 08/18/15 0906    Subjective i'm at least 60% better.   Patient Stated Goals Get out of pain.   Currently in Pain? Yes   Pain Score 3    Pain Location Foot   Pain Orientation Left   Pain Descriptors / Indicators Sore   Pain Onset More than a month ago   Pain Frequency Intermittent   Aggravating Factors  Prolonged standing                         [facility] Adult PT Treatment/Exercise - 08/18/15 0001    Modalities   Modalities Ultrasound   Ultrasound   Ultrasound Location LT foot Plantar fascia   Ultrasound Parameters 1.5 w/cm2 x 10 min while on stretch   Ultrasound Goals Pain   Manual Therapy   Myofascial Release IASTW/MFR to L Plantar Fascia and calves to decrease tightness and pain   Ankle Exercises: Standing   Rocker Board 5 minutes                  PT Short Term Goals - 07/14/15 1421    PT SHORT TERM GOAL #1   Title Ind with a HEP.   Time 2   Period Weeks   Status New           PT  Long Term Goals - 07/14/15 1421    PT LONG TERM GOAL #1   Title Walk a community distance with pain not > 2-3/10.   Time 6   Period Weeks   Status New               Plan - 08/18/15 0946    Clinical Impression Statement Pt did Fairly well today. He was sore and tight still LT plantar fascia, but less than before. He feels that he continues to improve with each Rx. We also incorporated STW to his calves F?B stretching and he did well. Long periods of standing is when he feels discomfort the most. Dr Irving Shows has also fitted Pt for orthotics that he shall have soon.    Pt will benefit from skilled therapeutic intervention in order to improve on the following deficits Pain;Decreased activity tolerance   Rehab Potential Excellent   PT Frequency 2x / week   PT Duration 6 weeks   PT Treatment/Interventions Therapeutic exercise;Manual  techniques;Ultrasound   PT Next Visit Plan . Continue [place], STW and TE.   Consulted and Agree with Plan of Care Patient        Problem List Patient Active Problem List   Diagnosis Date Noted  . Essential hypertension, benign 06/10/2015  . Depression 06/10/2015  . Hyperlipidemia LDL goal <130 06/10/2015  . GERD (gastroesophageal reflux disease) 06/10/2015    [name],[name], [name] 08/18/2015, 5:58 PM  [facility] [place], [place], [address] Phone: [phone]   Fax:  [phone]  Name: [name] MRN: [id number] Date of Birth: 08/08/1951

## 2015-08-25 ENCOUNTER — Ambulatory Visit: Payer: Medicare Other | Attending: Podiatry | Admitting: *Deleted

## 2015-08-25 DIAGNOSIS — M79672 Pain in left foot: Secondary | ICD-10-CM | POA: Insufficient documentation

## 2015-08-25 NOTE — Therapy (Signed)
[facility] [place], [place], [address] Phone: [phone]   Fax:  [phone]  Physical Therapy Treatment  Patient Details  Name: [name] MRN: [id number] Date of Birth: Feb 04, 1951 Referring Provider: Vonna Kotyk Dettinger MD.  Encounter Date: 08/25/2015      PT End of Session - 08/25/15 1040    Visit Number 10   Number of Visits 12   Date for PT Re-Evaluation 09/01/15   PT Start Time 1030   PT Stop Time 1120   PT Time Calculation (min) 50 min      Past Medical History  Diagnosis Date  . Hyperlipidemia   . Hypertension   . Depression   . GERD (gastroesophageal reflux disease)   . Heart disease     some blockage in LAD    Past Surgical History  Procedure Laterality Date  . Appendectomy    . Adnoidectomy    . Tonsillectomy and adenoidectomy    . Prostate surgery      There were no vitals filed for this visit.  Visit Diagnosis:  Left foot pain      Subjective Assessment - 08/25/15 1038    Subjective i'm at least 60% better.   Limitations Walking   Patient Stated Goals Get out of pain.   Currently in Pain? Yes   Pain Score 3    Pain Location Foot   Pain Orientation Left   Pain Descriptors / Indicators Sore   Pain Onset More than a month ago   Pain Frequency Intermittent   Aggravating Factors  prolonged standing                         [facility] Adult PT Treatment/Exercise - 08/25/15 0001    Modalities   Modalities Ultrasound   Ultrasound   Ultrasound Location LT foot Plantar fascia   Ultrasound Parameters 1.5 w/cm2 x 10 mins   Ultrasound Goals Pain   Manual Therapy   Manual Therapy Myofascial release   Myofascial Release IASTW/MFR to L Plantar Fascia and calves to decrease tightness and pain   Ankle Exercises: Standing   Rocker Board 5 minutes  calf stretching                  PT Short Term Goals - 07/14/15 1421    PT SHORT TERM GOAL #1   Title Ind with a HEP.    Time 2   Period Weeks   Status New           PT Long Term Goals - 07/14/15 1421    PT LONG TERM GOAL #1   Title Walk a community distance with pain not > 2-3/10.   Time 6   Period Weeks   Status New               Plan - 08/25/15 1255    Clinical Impression Statement Pt did fairly well with Rx and feels that he is about 60% better over all. He still has notable tightness in LT plantar fascia and has soreness during STW. He felt that his calves were sorer today than last time. Decreased tightness and pain after Rx.   Pt will benefit from skilled therapeutic intervention in order to improve on the following deficits Pain;Decreased activity tolerance   Rehab Potential Excellent   PT Frequency 2x / week   PT Duration 6 weeks   PT Treatment/Interventions Therapeutic exercise;Manual techniques;Ultrasound   PT Next Visit Plan . Continue  [place], STW and TE.   Consulted and Agree with Plan of Care Patient          G-Codes - 2015-08-28 1359    Functional Assessment Tool Used 10th visit FOTO Gcode 43%  limited   Functional Limitation Mobility: Walking and moving around   Mobility: Walking and Moving Around Current Status [phone]) At least 40 percent but less than 60 percent impaired, limited or restricted   Mobility: Walking and Moving Around Goal Status [phone]) At least 1 percent but less than 20 percent impaired, limited or restricted      Problem List Patient Active Problem List   Diagnosis Date Noted  . Essential hypertension, benign 06/10/2015  . Depression 06/10/2015  . Hyperlipidemia LDL goal <130 06/10/2015  . GERD (gastroesophageal reflux disease) 06/10/2015   Physical Therapy Progress Note  Dates of Reporting Period: 07/14/15 to 08-28-15  Objective Reports of Subjective Statement: Patient feels about 60% better.  Objective Measurements: Overall, less pain during ADL's.  Goal Update: Walking goal is ongoing but patient is progressing toward.  Plan: Continue  with U/S; IASTM and stretching.  Reason Skilled Services are Required: Patient requires 1-1 manual techniques for treatment of his left plantar fasciitis.   August Longest, [place] MPT Aug 28, 2015, 1:59 PM  [facility] [address] [place], [place], [address] Phone: [phone]   Fax:  [phone]  Name: [name] MRN: [id number] Date of Birth: October 04, 1950

## 2015-08-25 NOTE — Therapy (Signed)
[facility] [place], [place], [address] Phone: [phone]   Fax:  [phone]  Physical Therapy Treatment  Patient Details  Name: [name] MRN: [id number] Date of Birth: [birth year] Referring Provider: Vonna Kotyk Dettinger MD.  Encounter Date: 08/25/2015      PT End of Session - 08/25/15 1040    Visit Number 10   Number of Visits 12   Date for PT Re-Evaluation 09/01/15   PT Start Time 1030   PT Stop Time 1120   PT Time Calculation (min) 50 min      Past Medical History  Diagnosis Date  . Hyperlipidemia   . Hypertension   . Depression   . GERD (gastroesophageal reflux disease)   . Heart disease     some blockage in LAD    Past Surgical History  Procedure Laterality Date  . Appendectomy    . Adnoidectomy    . Tonsillectomy and adenoidectomy    . Prostate surgery      There were no vitals filed for this visit.  Visit Diagnosis:  Left foot pain      Subjective Assessment - 08/25/15 1038    Subjective i'm at least 60% better.   Limitations Walking   Patient Stated Goals Get out of pain.   Currently in Pain? Yes   Pain Score 3    Pain Location Foot   Pain Orientation Left   Pain Descriptors / Indicators Sore   Pain Onset More than a month ago   Pain Frequency Intermittent   Aggravating Factors  prolonged standing                         [facility] Adult PT Treatment/Exercise - 08/25/15 0001    Modalities   Modalities Ultrasound   Ultrasound   Ultrasound Location LT foot Plantar fascia   Ultrasound Parameters 1.5 w/cm2 x 10 mins   Ultrasound Goals Pain   Manual Therapy   Manual Therapy Myofascial release   Myofascial Release IASTW/MFR to L Plantar Fascia and calves to decrease tightness and pain   Ankle Exercises: Standing   Rocker Board 5 minutes  calf stretching                  PT Short Term Goals - 07/14/15 1421    PT SHORT TERM GOAL #1   Title Ind with a HEP.    Time 2   Period Weeks   Status New           PT Long Term Goals - 07/14/15 1421    PT LONG TERM GOAL #1   Title Walk a community distance with pain not > 2-3/10.   Time 6   Period Weeks   Status New               Plan - 08/25/15 1255    Clinical Impression Statement Pt did fairly well with Rx and feels that he is about 60% better over all. He still has notable tightness in LT plantar fascia and has soreness during STW. He felt that his calves were sorer today than last time. Decreased tightness and pain after Rx.   Pt will benefit from skilled therapeutic intervention in order to improve on the following deficits Pain;Decreased activity tolerance   Rehab Potential Excellent   PT Frequency 2x / week   PT Duration 6 weeks   PT Treatment/Interventions Therapeutic exercise;Manual techniques;Ultrasound   PT Next Visit Plan . Continue  [place], STW and TE.   Consulted and Agree with Plan of Care Patient          G-Codes - 09-15-2015 1306    Functional Assessment Tool Used 10th visit FOTO Gcode 43%  limited      Problem List Patient Active Problem List   Diagnosis Date Noted  . Essential hypertension, benign 06/10/2015  . Depression 06/10/2015  . Hyperlipidemia LDL goal <130 06/10/2015  . GERD (gastroesophageal reflux disease) 06/10/2015    RAMSEUR,CHRIS, PTA 09-15-15, 1:20 PM  [facility] [address] [place], [place], [address] Phone: [phone]   Fax:  [phone]  Name: [name] MRN: [id number] Date of Birth: Apr 14, 1951

## 2015-08-28 ENCOUNTER — Ambulatory Visit: Payer: Medicare Other | Admitting: *Deleted

## 2015-08-28 ENCOUNTER — Encounter: Payer: Self-pay | Admitting: *Deleted

## 2015-08-28 DIAGNOSIS — M79672 Pain in left foot: Secondary | ICD-10-CM

## 2015-08-28 NOTE — Therapy (Signed)
[facility] [address] [place], [place], [address] Phone: [phone]   Fax:  [phone]  Physical Therapy Treatment  Patient Details  Name: [name] MRN: [id number] Date of Birth: 04/10/1951 Referring Provider: Joshua Dettinger MD.  Encounter Date: 08/28/2015      PT End of Session - 08/28/15 0955    Visit Number 11   Number of Visits 12   Date for PT Re-Evaluation 09/01/15   PT Start Time 0945   PT Stop Time 1038   PT Time Calculation (min) 53 min      Past Medical History  Diagnosis Date  . Hyperlipidemia   . Hypertension   . Depression   . GERD (gastroesophageal reflux disease)   . Heart disease     some blockage in LAD    Past Surgical History  Procedure Laterality Date  . Appendectomy    . Adnoidectomy    . Tonsillectomy and adenoidectomy    . Prostate surgery      There were no vitals filed for this visit.  Visit Diagnosis:  Left foot pain      Subjective Assessment - 08/28/15 0947    Subjective i'm at least 60% better.  Still doing good   Limitations Walking   Currently in Pain? Yes   Pain Score 2    Pain Location Foot   Pain Orientation Left   Pain Descriptors / Indicators Sore   Pain Onset More than a month ago   Aggravating Factors  prolonged standing                         [facility] Adult PT Treatment/Exercise - 08/28/15 0001    Modalities   Modalities Ultrasound   Ultrasound   Ultrasound Location LT foot   Ultrasound Parameters 1.5 w/cm2 x 10 mins   Ultrasound Goals Pain   Manual Therapy   Manual Therapy Myofascial release   Manual therapy comments --   Myofascial Release IASTW/ MFR to L Plantar Fascia and calves to decrease tightness and pain   Ankle Exercises: Standing   Rocker Board 5 minutes  calf stretching                  PT Short Term Goals - 08/28/15 1046    PT SHORT TERM GOAL #1   Title Ind with a HEP.   Time 2   Period Weeks   Status  Achieved           PT Long Term Goals - 08/28/15 1046    PT LONG TERM GOAL #1   Title Walk a community distance with pain not > 2-3/10.   Time 6   Period Weeks   Status On-going               Plan - 08/28/15 1030    Clinical Impression Statement Pt did well again and feels about 70% better now. He has met STG #1, but not LTG yet due to foot pain over 3/10 and is ongoing.   Pt will benefit from skilled therapeutic intervention in order to improve on the following deficits Pain;Decreased activity tolerance   Rehab Potential Excellent   PT Frequency 2x / week   PT Duration 6 weeks   PT Treatment/Interventions Therapeutic exercise;Manual techniques;Ultrasound   PT Next Visit Plan . Continue US, STW and TE.   Consulted and Agree with Plan of Care Patient        Problem List Patient   Active Problem List   Diagnosis Date Noted  . Essential hypertension, benign 06/10/2015  . Depression 06/10/2015  . Hyperlipidemia LDL goal <130 06/10/2015  . GERD (gastroesophageal reflux disease) 06/10/2015    RAMSEUR,CHRIS, PTA 08/28/2015, 10:53 AM  [facility] [address] [place], [place], [address] Phone: [phone]   Fax:  [phone]  Name: [name] MRN: [id number] Date of Birth: 05/18/1951     

## 2015-09-02 ENCOUNTER — Ambulatory Visit: Payer: Medicare Other | Admitting: *Deleted

## 2015-09-02 DIAGNOSIS — M79672 Pain in left foot: Secondary | ICD-10-CM | POA: Diagnosis not present

## 2015-09-02 NOTE — Therapy (Signed)
[facility] [place], [place], [address] Phone: [phone]   Fax:  [phone]  Physical Therapy Treatment  Patient Details  Name: [name] MRN: [id number] Date of Birth: 1951-01-04 Referring Provider: Vonna Kotyk Dettinger MD.  Encounter Date: 09/02/2015      PT End of Session - 09/02/15 1124    Visit Number 12   Number of Visits 12   Date for PT Re-Evaluation 09/01/15   PT Start Time 1030   PT Stop Time 1118   PT Time Calculation (min) 48 min      Past Medical History  Diagnosis Date  . Hyperlipidemia   . Hypertension   . Depression   . GERD (gastroesophageal reflux disease)   . Heart disease     some blockage in LAD    Past Surgical History  Procedure Laterality Date  . Appendectomy    . Adnoidectomy    . Tonsillectomy and adenoidectomy    . Prostate surgery      There were no vitals filed for this visit.  Visit Diagnosis:  Left foot pain      Subjective Assessment - 09/02/15 1038    Subjective (p) i'm at least 60%to 70%  better.  Still doing good 1-5/10   Limitations (p) Walking   Patient Stated Goals (p) Get out of pain.   Currently in Pain? (p) Yes   Pain Score (p) 1    Pain Location (p) Foot   Pain Orientation (p) Left   Pain Descriptors / Indicators (p) Sore   Pain Onset (p) More than a month ago   Pain Frequency (p) Intermittent                         [facility] Adult PT Treatment/Exercise - 09/02/15 0001    Modalities   Modalities Ultrasound   Ultrasound   Ultrasound Location LT foot plantar fascia   Ultrasound Parameters 1.5 w/cm2 x 10 mins   Ultrasound Goals Pain   Manual Therapy   Manual Therapy Myofascial release   Myofascial Release IASTW/ MFR to L Plantar Fascia with pt supine  and calves in prone to decrease tightness and pain   Ankle Exercises: Standing   Rocker Board 5 minutes  calf stretching                PT Education - 09/02/15 1240    Education provided Yes   Education Details calf stretching   Person(s) Educated Patient   Methods Explanation;Demonstration;Tactile cues;Verbal cues   Comprehension Verbalized understanding;Returned demonstration          PT Short Term Goals - 09/02/15 1124    PT SHORT TERM GOAL #1   Title Ind with a HEP.   Time 2   Period Weeks   Status Achieved           PT Long Term Goals - 09/02/15 1123    PT LONG TERM GOAL #1   Title Walk a community distance with pain not > 2-3/10.   Time 6   Period Weeks   Status Achieved               Plan - 09/02/15 1235    Clinical Impression Statement Pt did great with PT today and has MET all goals. He feels almost 70% better and should get his orthotics from Dr Irving Shows in th next two weeks and hopefully resolve the rest of his foot pain. Pt wiil be DC from  PT at this time   Rehab Potential Excellent   PT Treatment/Interventions Therapeutic exercise;Manual techniques;Ultrasound   PT Next Visit Plan DC to HEP all goals MET   Consulted and Agree with Plan of Care Patient          G-Codes - Sep 24, 2015 1231    Functional Assessment Tool Used 12 visit DC FOTO with 41% limitation      Problem List Patient Active Problem List   Diagnosis Date Noted  . Essential hypertension, benign 06/10/2015  . Depression 06/10/2015  . Hyperlipidemia LDL goal <130 06/10/2015  . GERD (gastroesophageal reflux disease) 06/10/2015    Gervase Colberg,CHRIS , PTA  09-24-15, 12:41 PM  [facility] [place], [place], [address] Phone: [phone]   Fax:  [phone]  Name: [name] MRN: [id number] Date of Birth: 1951/03/27

## 2015-09-02 NOTE — Therapy (Signed)
[facility] [place], [place], [address] Phone: [phone]   Fax:  [phone]  Physical Therapy Treatment  Patient Details  Name: [name] MRN: [id number] Date of Birth: 1951-01-04 Referring Provider: Vonna Kotyk Dettinger MD.  Encounter Date: 09/02/2015      PT End of Session - 09/02/15 1124    Visit Number 12   Number of Visits 12   Date for PT Re-Evaluation 09/01/15   PT Start Time 1030   PT Stop Time 1118   PT Time Calculation (min) 48 min      Past Medical History  Diagnosis Date  . Hyperlipidemia   . Hypertension   . Depression   . GERD (gastroesophageal reflux disease)   . Heart disease     some blockage in LAD    Past Surgical History  Procedure Laterality Date  . Appendectomy    . Adnoidectomy    . Tonsillectomy and adenoidectomy    . Prostate surgery      There were no vitals filed for this visit.  Visit Diagnosis:  Left foot pain      Subjective Assessment - 09/02/15 1038    Subjective (p) i'm at least 60%to 70%  better.  Still doing good 1-5/10   Limitations (p) Walking   Patient Stated Goals (p) Get out of pain.   Currently in Pain? (p) Yes   Pain Score (p) 1    Pain Location (p) Foot   Pain Orientation (p) Left   Pain Descriptors / Indicators (p) Sore   Pain Onset (p) More than a month ago   Pain Frequency (p) Intermittent                         [facility] Adult PT Treatment/Exercise - 09/02/15 0001    Modalities   Modalities Ultrasound   Ultrasound   Ultrasound Location LT foot plantar fascia   Ultrasound Parameters 1.5 w/cm2 x 10 mins   Ultrasound Goals Pain   Manual Therapy   Manual Therapy Myofascial release   Myofascial Release IASTW/ MFR to L Plantar Fascia with pt supine  and calves in prone to decrease tightness and pain   Ankle Exercises: Standing   Rocker Board 5 minutes  calf stretching                PT Education - 09/02/15 1240    Education provided Yes   Education Details calf stretching   Person(s) Educated Patient   Methods Explanation;Demonstration;Tactile cues;Verbal cues   Comprehension Verbalized understanding;Returned demonstration          PT Short Term Goals - 09/02/15 1124    PT SHORT TERM GOAL #1   Title Ind with a HEP.   Time 2   Period Weeks   Status Achieved           PT Long Term Goals - 09/02/15 1123    PT LONG TERM GOAL #1   Title Walk a community distance with pain not > 2-3/10.   Time 6   Period Weeks   Status Achieved               Plan - 09/02/15 1235    Clinical Impression Statement Pt did great with PT today and has MET all goals. He feels almost 70% better and should get his orthotics from Dr Irving Shows in th next two weeks and hopefully resolve the rest of his foot pain. Pt wiil be DC from  PT at this time   Rehab Potential Excellent   PT Treatment/Interventions Therapeutic exercise;Manual techniques;Ultrasound   PT Next Visit Plan DC to HEP all goals MET   Consulted and Agree with Plan of Care Patient          G-Codes - 14-Sep-2015 1315    Functional Assessment Tool Used 12 visit DC FOTO with 41% limitation   Functional Limitation Mobility: Walking and moving around   Mobility: Walking and Moving Around Current Status [phone]) At least 40 percent but less than 60 percent impaired, limited or restricted   Mobility: Walking and Moving Around Goal Status [phone]) At least 40 percent but less than 60 percent impaired, limited or restricted   Mobility: Walking and Moving Around Discharge Status [phone]) At least 40 percent but less than 60 percent impaired, limited or restricted      Problem List Patient Active Problem List   Diagnosis Date Noted  . Essential hypertension, benign 06/10/2015  . Depression 06/10/2015  . Hyperlipidemia LDL goal <130 06/10/2015  . GERD (gastroesophageal reflux disease) 06/10/2015   PHYSICAL THERAPY DISCHARGE SUMMARY  Visits from Start  of Care: 12  Current functional level related to goals / functional outcomes: Please see above.   Remaining deficits: All goals met.   Education / Equipment: HEP. Plan: Patient agrees to discharge.  Patient goals were met. Patient is being discharged due to meeting the stated rehab goals.  ?????      Tacora Athanas, [place] MPT Sep 14, 2015, 1:15 PM  [facility] [address] [place], [place], [address] Phone: [phone]   Fax:  [phone]  Name: Crue Otero MRN: [id number] Date of Birth: 02-22-1951

## 2015-09-08 NOTE — Progress Notes (Signed)
Cardiology Office Note   Date:  09/09/2015   ID:  [name], DOB 11-13-1950, MRN [id number]  [name]:  Worthy Rancher, MD  Cardiologist:   [name], MD   Chief Complaint  Patient presents with  . New Evaluation    referred by Dr. Warrick Parisian for Atherosclerosis//Skipped beats--pt c/o chest discomfort, lasts a few seconds, comes and goes  . Insomnia    pt states he has trouble breathing at night accompanied by headaches, and going to the bathroom every hour.  . Dizziness    random      History of Present Illness: [name] is a 65 y.o. male with hypertension, hyperlipidemia and GERD who presents for an evaluation of chest pain.  [name] on 12/19 and asked for a referral to cardiology.  He has previously been followed by a cardiologist for CAD.  [name] recently moved to [facility] in September from [place], [place].  He reports intermittent episodes of chest pressure. It feels like a brief spasm and there is no associated shortness of breath. He has noted some mild nausea but denies diaphoresis or radiation. He wonders if he could be related to indigestion. 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  [name] has undergone stress testing, most recently years ago. He states that it was negative for ischemia. However he does have a very strong family history of premature coronary artery disease. His father had a heart attack at age 59.  One brother had a heart attack at age there at age 49.  He exercises three times per week on the elliptical.  He denies any chest pain or shortness of breath with exercise.  He denies lower extremity edema, orthopnea or PND.  [name] reports difficulty sleeping. He awakens frequently at night to go to the restroom. He then has a hard time falling asleep. He also notes snoring and frequently awaking gasping for air. He sleeps 5 hours per night maximum. He  also endorses daytime somnolence.   Past Medical History  Diagnosis Date  . Hyperlipidemia   . Hypertension   . Depression   . GERD (gastroesophageal reflux disease)   . Heart disease     some blockage in LAD    Past Surgical History  Procedure Laterality Date  . Appendectomy    . Adnoidectomy    . Tonsillectomy and adenoidectomy    . Prostate surgery       Current Outpatient Prescriptions  Medication Sig Dispense Refill  . amLODipine (NORVASC) 5 MG tablet Take 5 mg by mouth daily.    [name] Kitchen atorvastatin (LIPITOR) 80 MG tablet Take 80 mg by mouth daily.    . diclofenac (VOLTAREN) 75 MG EC tablet Take 1 tablet (75 mg total) by mouth 2 (two) times daily. 180 tablet 0  . escitalopram (LEXAPRO) 20 MG tablet Take 20 mg by mouth daily.    [name] Kitchen omeprazole (PRILOSEC) 20 MG capsule Take 1 capsule (20 mg total) by mouth daily. 90 capsule 1  . valsartan-hydrochlorothiazide (DIOVAN-HCT) 320-12.5 MG tablet Take 1 tablet by mouth daily.     No current facility-administered medications for this visit.    Allergies:   Review of patient's allergies indicates no known allergies.    Social History:  The patient  reports that he has never smoked. He has never used smokeless tobacco. He reports that he does not use illicit  drugs.   Family History:  The patient's family history includes AAA (abdominal aortic aneurysm) in his mother; COPD in his mother; Depression in his father; Diabetes in his father; Early death in his brother; Heart disease in his brother, father, and mother.    ROS:  Please see the history of present illness.   Otherwise, review of systems are positive for none.   All other systems are reviewed and negative.    PHYSICAL EXAM: VS:  BP 130/92 mmHg  Pulse 68  Ht 6\' 1"  (1.854 m)  Wt 112.764 kg (248 lb 9.6 oz)  BMI 32.81 kg/m2 , BMI Body mass index is 32.81 kg/(m^2). GENERAL:  Well appearing HEENT:  Pupils equal round and reactive, fundi not visualized, oral mucosa  unremarkable NECK:  No jugular venous distention, waveform within normal limits, carotid upstroke brisk and symmetric, no bruits, no thyromegaly LYMPHATICS:  No cervical adenopathy LUNGS:  Clear to auscultation bilaterally HEART:  RRR.  PMI not displaced or sustained,S1 and S2 within normal limits, no S3, no S4, no clicks, no rubs, nourmurs ABD:  Flat, positive bowel sounds normal in frequency in pitch, no bruits, no rebound, no guarding, no midline pulsatile mass, no hepatomegaly, no splenomegaly EXT:  2 plus pulses throughout, no edema, no cyanosis no clubbing SKIN:  No rashes no nodules NEURO:  Cranial nerves II through XII grossly intact, motor grossly intact throughout PSYCH:  Cognitively intact, oriented to person place and time    EKG:  EKG is ordered today. The ekg ordered today demonstrates sinus rhythm. Rate 68 bpm. 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   Recent Labs: No results found for requested labs within last 365 days.    Lipid Panel No results found for: CHOL, TRIG, HDL, CHOLHDL, VLDL, LDLCALC, LDLDIRECT    Wt Readings from Last 3 Encounters:  09/09/15 112.764 kg (248 lb 9.6 oz)  08/10/15 113.944 kg (251 lb 3.2 oz)  06/10/15 112.583 kg (248 lb 3.2 oz)      ASSESSMENT AND PLAN:  # Hypertension:  Blood pressure was mildly elevated.  He states that it is typically well controlled. Continue amlodipine and Diovan. We will obtain his outside records rom his previous cardiologist.  # Hyperlipidemia:  Continue atorvastatin. Check a comprehensive metabolic panel for chronic drug monitoring.  # Chest pain: [name] has atypical chest pain.  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  Therefore, we will obtain an exercise Cardiolite to evaluate for ischemia.  It is also possible that his symptoms are attributable to GERD or stress.  # Daytime somnolence:  [name] has several symptoms concerning for sleep apnea,  including daytime somnolence, snoring, and frequent nocturnal awakening. His Epworth Sleepiness Scale is 12.We will refer him for sleep study to evaluate for obstructive sleep apnea.   Current medicines are reviewed at length with the patient today.  The patient does not have concerns regarding medicines.  The following changes have been made:  no change  Labs/ tests ordered today include:   Orders Placed This Encounter  Procedures  . CBC  . Comprehensive metabolic panel  . Myocardial Perfusion Imaging  . EKG 12-Lead  . Split night study     Disposition:   FU with Tymar Polyak C. Oval Linsey, MD, [facility] in 1 year.   This note was written with the assistance of speech recognition software.  Please excuse any transcriptional errors.  Signed, Shriyans Kuenzi C. Oval Linsey, MD, [facility]  09/09/2015 12:15 PM  [facility]

## 2015-09-09 ENCOUNTER — Ambulatory Visit (INDEPENDENT_AMBULATORY_CARE_PROVIDER_SITE_OTHER): Payer: Medicare Other | Admitting: Cardiovascular Disease

## 2015-09-09 ENCOUNTER — Encounter: Payer: Self-pay | Admitting: Cardiovascular Disease

## 2015-09-09 VITALS — BP 130/92 | HR 68 | Ht 73.0 in | Wt 248.6 lb

## 2015-09-09 DIAGNOSIS — R079 Chest pain, unspecified: Secondary | ICD-10-CM

## 2015-09-09 DIAGNOSIS — G4710 Hypersomnia, unspecified: Secondary | ICD-10-CM | POA: Diagnosis not present

## 2015-09-09 DIAGNOSIS — Z79899 Other long term (current) drug therapy: Secondary | ICD-10-CM | POA: Diagnosis not present

## 2015-09-09 DIAGNOSIS — R0683 Snoring: Secondary | ICD-10-CM

## 2015-09-09 DIAGNOSIS — R400 Somnolence: Secondary | ICD-10-CM

## 2015-09-09 LAB — COMPREHENSIVE METABOLIC PANEL
ALK PHOS: 82 U/L (ref 40–115)
ALT: 26 U/L (ref 9–46)
AST: 21 U/L (ref 10–35)
Albumin: 4.4 g/dL (ref 3.6–5.1)
BUN: 10 mg/dL (ref 7–25)
CO2: 32 mmol/L — ABNORMAL HIGH (ref 20–31)
Calcium: 10.1 mg/dL (ref 8.6–10.3)
Chloride: 100 mmol/L (ref 98–110)
Creat: 0.84 mg/dL (ref 0.70–1.25)
GLUCOSE: 90 mg/dL (ref 65–99)
POTASSIUM: 4.2 mmol/L (ref 3.5–5.3)
Sodium: 140 mmol/L (ref 135–146)
Total Bilirubin: 0.6 mg/dL (ref 0.2–1.2)
Total Protein: 6.9 g/dL (ref 6.1–8.1)

## 2015-09-09 LAB — CBC
HCT: 46.9 % (ref 39.0–52.0)
HEMOGLOBIN: 16.2 g/dL (ref 13.0–17.0)
MCH: 31.3 pg (ref 26.0–34.0)
MCHC: 34.5 g/dL (ref 30.0–36.0)
MCV: 90.7 fL (ref 78.0–100.0)
MPV: 9.4 fL (ref 8.6–12.4)
PLATELETS: 261 10*3/uL (ref 150–400)
RBC: 5.17 MIL/uL (ref 4.22–5.81)
RDW: 14 % (ref 11.5–15.5)
WBC: 6.3 10*3/uL (ref 4.0–10.5)

## 2015-09-09 NOTE — Patient Instructions (Signed)
Dr Oval Linsey recommends that you return for lab work TODAY.  Your physician has recommended that you have a sleep study. This test records several body functions during sleep, including: brain activity, eye movement, oxygen and carbon dioxide blood levels, heart rate and rhythm, breathing rate and rhythm, the flow of air through your mouth and nose, snoring, body muscle movements, and chest and belly movement.  Your physician has requested that you have en exercise stress myoview. For further information please visit [email or web address]. Please follow instruction sheet, as given.  Dr Oval Linsey recommends that you schedule a follow-up appointment in 1 year. You will receive a reminder letter in the mail two months in advance. If you don't receive a letter, please call our office to schedule the follow-up appointment.  If you need a refill on your cardiac medications before your next appointment, please call your pharmacy.

## 2015-09-14 ENCOUNTER — Telehealth: Payer: Self-pay | Admitting: *Deleted

## 2015-09-14 NOTE — Telephone Encounter (Signed)
-----   Message from Skeet Latch, MD sent at 09/14/2015 12:28 AM EST ----- Normal labs

## 2015-09-14 NOTE — Telephone Encounter (Signed)
Spoke to patient. Result given . Verbalized understanding  

## 2015-09-17 ENCOUNTER — Telehealth: Payer: Self-pay

## 2015-09-17 NOTE — Telephone Encounter (Signed)
Faxed records release to [facility] - Dr Ricki Miller in [place]

## 2015-09-25 ENCOUNTER — Inpatient Hospital Stay (HOSPITAL_COMMUNITY): Admission: RE | Admit: 2015-09-25 | Payer: Medicare Other | Source: Ambulatory Visit

## 2015-10-12 ENCOUNTER — Other Ambulatory Visit: Payer: Self-pay | Admitting: Family Medicine

## 2015-10-20 DIAGNOSIS — M722 Plantar fascial fibromatosis: Secondary | ICD-10-CM | POA: Diagnosis not present

## 2015-10-21 ENCOUNTER — Telehealth (HOSPITAL_COMMUNITY): Payer: Self-pay

## 2015-10-21 NOTE — Telephone Encounter (Signed)
Encounter complete. 

## 2015-10-23 ENCOUNTER — Ambulatory Visit (HOSPITAL_COMMUNITY)
Admission: RE | Admit: 2015-10-23 | Discharge: 2015-10-23 | Disposition: A | Discharge status: Home / Self Care | Payer: Medicare Other | Source: Ambulatory Visit | Attending: Cardiovascular Disease | Admitting: Cardiovascular Disease

## 2015-10-23 DIAGNOSIS — Z8249 Family history of ischemic heart disease and other diseases of the circulatory system: Secondary | ICD-10-CM | POA: Diagnosis not present

## 2015-10-23 DIAGNOSIS — Z6832 Body mass index (BMI) 32.0-32.9, adult: Secondary | ICD-10-CM | POA: Insufficient documentation

## 2015-10-23 DIAGNOSIS — I4510 Unspecified right bundle-branch block: Secondary | ICD-10-CM | POA: Insufficient documentation

## 2015-10-23 DIAGNOSIS — R0609 Other forms of dyspnea: Secondary | ICD-10-CM | POA: Insufficient documentation

## 2015-10-23 DIAGNOSIS — R079 Chest pain, unspecified: Secondary | ICD-10-CM | POA: Diagnosis not present

## 2015-10-23 DIAGNOSIS — R42 Dizziness and giddiness: Secondary | ICD-10-CM | POA: Diagnosis not present

## 2015-10-23 DIAGNOSIS — I10 Essential (primary) hypertension: Secondary | ICD-10-CM | POA: Diagnosis not present

## 2015-10-23 DIAGNOSIS — R5383 Other fatigue: Secondary | ICD-10-CM | POA: Insufficient documentation

## 2015-10-23 DIAGNOSIS — E669 Obesity, unspecified: Secondary | ICD-10-CM | POA: Diagnosis not present

## 2015-10-23 LAB — MYOCARDIAL PERFUSION IMAGING
CHL CUP NUCLEAR SDS: 3
CHL CUP RESTING HR STRESS: 66 {beats}/min
CHL RATE OF PERCEIVED EXERTION: 17
CSEPED: 8 min
CSEPEW: 10.1 METS
CSEPPHR: 137 {beats}/min
Exercise duration (sec): 30 s
LV dias vol: 128 mL
LVSYSVOL: 57 mL
MPHR: 154 {beats}/min
Percent HR: 88 %
SRS: 0
SSS: 3
TID: 0.97

## 2015-10-23 MED ORDER — TECHNETIUM TC 99M SESTAMIBI GENERIC - CARDIOLITE
10.5000 | Freq: Once | INTRAVENOUS | Status: AC | PRN
  Administered 2015-10-23: 10.5 via INTRAVENOUS

## 2015-10-23 MED ORDER — TECHNETIUM TC 99M SESTAMIBI GENERIC - CARDIOLITE
31.9000 | Freq: Once | INTRAVENOUS | Status: AC | PRN
  Administered 2015-10-23: 31.9 via INTRAVENOUS

## 2015-10-27 ENCOUNTER — Telehealth: Payer: Self-pay | Admitting: Cardiovascular Disease

## 2015-10-27 NOTE — Telephone Encounter (Signed)
Returning your call from yesterday,it was about his stress test results.

## 2015-10-27 NOTE — Telephone Encounter (Signed)
Returned call to patient stress test results given. 

## 2015-11-03 ENCOUNTER — Encounter (HOSPITAL_BASED_OUTPATIENT_CLINIC_OR_DEPARTMENT_OTHER): Payer: Medicare Other

## 2015-11-12 ENCOUNTER — Ambulatory Visit (INDEPENDENT_AMBULATORY_CARE_PROVIDER_SITE_OTHER): Payer: Medicare Other | Admitting: Family Medicine

## 2015-11-12 ENCOUNTER — Encounter: Payer: Self-pay | Admitting: Family Medicine

## 2015-11-12 VITALS — BP 123/86 | HR 73 | Temp 98.1°F | Ht 73.0 in | Wt 250.0 lb

## 2015-11-12 DIAGNOSIS — I10 Essential (primary) hypertension: Secondary | ICD-10-CM

## 2015-11-12 DIAGNOSIS — F329 Major depressive disorder, single episode, unspecified: Secondary | ICD-10-CM | POA: Diagnosis not present

## 2015-11-12 DIAGNOSIS — E785 Hyperlipidemia, unspecified: Secondary | ICD-10-CM | POA: Diagnosis not present

## 2015-11-12 DIAGNOSIS — K219 Gastro-esophageal reflux disease without esophagitis: Secondary | ICD-10-CM | POA: Diagnosis not present

## 2015-11-12 DIAGNOSIS — F32A Depression, unspecified: Secondary | ICD-10-CM

## 2015-11-12 MED ORDER — ATORVASTATIN CALCIUM 80 MG PO TABS: 80.0000 mg | ORAL_TABLET | Freq: Every day | ORAL | Status: DC

## 2015-11-12 MED ORDER — OMEPRAZOLE 20 MG PO CPDR: 20.0000 mg | DELAYED_RELEASE_CAPSULE | Freq: Every day | ORAL | Status: DC

## 2015-11-12 MED ORDER — AMLODIPINE BESYLATE 5 MG PO TABS: 5.0000 mg | ORAL_TABLET | Freq: Every day | ORAL | Status: DC

## 2015-11-12 MED ORDER — ESCITALOPRAM OXALATE 20 MG PO TABS: 20.0000 mg | ORAL_TABLET | Freq: Every day | ORAL | Status: DC

## 2015-11-12 MED ORDER — DICLOFENAC SODIUM 75 MG PO TBEC: 75.0000 mg | DELAYED_RELEASE_TABLET | Freq: Two times a day (BID) | ORAL | Status: DC | PRN

## 2015-11-12 MED ORDER — VALSARTAN-HYDROCHLOROTHIAZIDE 320-12.5 MG PO TABS: 1.0000 | ORAL_TABLET | Freq: Every day | ORAL | Status: DC

## 2015-11-12 NOTE — Progress Notes (Signed)
BP 123/86 mmHg  Pulse 73  Temp(Src) 98.1 F (36.7 C) (Oral)  Ht 6\' 1"  (1.854 m)  Wt 250 lb (113.399 kg)  BMI 32.99 kg/m2   Subjective:    Patient ID: [name], male    DOB: 06/19/1951, 65 y.o.   MRN: [id number]  HPI: [name] is a 66 y.o. male presenting on 11/12/2015 for Depression   HPI Depression Patient is coming for three-month follow-up of anxiety and depression. He feels like the medication is doing very well for him and does not have any issues with it. He would like to continue current medication. He is sleeping well and denies any suicidal ideations. He says 8 out of 10 very happy with where he is at  Hypertension and hyperlipidemia. Patient is coming in for refills on medications for both hypertension and hyperlipidemia. Patient denies headaches, blurred vision, chest pains, shortness of breath, or weakness. Denies any side effects from medication and is content with current medication.   GERD Patient is coming in for recheck on GERD. He is taking omeprazole 20 and feels like he is doing very well from a denies any issues with that.  Relevant past medical, surgical, family and social history reviewed and updated as indicated. Interim medical history since our last visit reviewed. Allergies and medications reviewed and updated.  Review of Systems  Constitutional: Negative for fever and chills.  HENT: Negative for ear discharge and ear pain.   Eyes: Negative for discharge and visual disturbance.  Respiratory: Negative for shortness of breath and wheezing.   Cardiovascular: Negative for chest pain and leg swelling.  Gastrointestinal: Negative for abdominal pain, diarrhea and constipation.  Genitourinary: Negative for difficulty urinating.  Musculoskeletal: Negative for back pain and gait problem.  Skin: Negative for rash.  Neurological: Negative for dizziness, syncope, light-headedness and headaches.  Psychiatric/Behavioral: Negative for suicidal ideas,  hallucinations, sleep disturbance, self-injury, dysphoric mood and agitation.  All other systems reviewed and are negative.   Per HPI unless specifically indicated above     Medication List       This list is accurate as of: 11/12/15  8:44 AM.  Always use your most recent med list.               amLODipine 5 MG tablet  Commonly known as:  NORVASC  Take 1 tablet (5 mg total) by mouth daily.     atorvastatin 80 MG tablet  Commonly known as:  LIPITOR  Take 1 tablet (80 mg total) by mouth daily.     diclofenac 75 MG EC tablet  Commonly known as:  VOLTAREN  Take 1 tablet (75 mg total) by mouth 2 (two) times daily as needed.     escitalopram 20 MG tablet  Commonly known as:  LEXAPRO  Take 1 tablet (20 mg total) by mouth daily.     omeprazole 20 MG capsule  Commonly known as:  PRILOSEC  Take 1 capsule (20 mg total) by mouth daily.     valsartan-hydrochlorothiazide 320-12.5 MG tablet  Commonly known as:  DIOVAN-HCT  Take 1 tablet by mouth daily.           Objective:    BP 123/86 mmHg  Pulse 73  Temp(Src) 98.1 F (36.7 C) (Oral)  Ht 6\' 1"  (1.854 m)  Wt 250 lb (113.399 kg)  BMI 32.99 kg/m2  Wt Readings from Last 3 Encounters:  11/12/15 250 lb (113.399 kg)  10/23/15 248 lb (112.492 kg)  09/09/15 248 lb 9.6 oz (  112.764 kg)    Physical Exam  Constitutional: He is oriented to person, place, and time. He appears well-developed and well-nourished. No distress.  Eyes: Conjunctivae and EOM are normal. Pupils are equal, round, and reactive to light. Right eye exhibits no discharge. No scleral icterus.  Cardiovascular: Normal rate, regular rhythm, normal heart sounds and intact distal pulses.   No murmur heard. Pulmonary/Chest: Effort normal and breath sounds normal. No respiratory distress. He has no wheezes.  Abdominal: He exhibits no distension.  Musculoskeletal: Normal range of motion. He exhibits no edema.  Neurological: He is alert and oriented to person, place,  and time. Coordination normal.  Skin: Skin is warm and dry. No rash noted. He is not diaphoretic.  Psychiatric: He has a normal mood and affect. His behavior is normal. Judgment and thought content normal. He expresses no suicidal ideation. He expresses no suicidal plans.  Vitals reviewed.      Assessment & Plan:   Problem List Items Addressed This Visit      Cardiovascular and Mediastinum   Essential hypertension, benign   Relevant Medications   valsartan-hydrochlorothiazide (DIOVAN-HCT) 320-12.5 MG tablet   atorvastatin (LIPITOR) 80 MG tablet   amLODipine (NORVASC) 5 MG tablet     Digestive   GERD (gastroesophageal reflux disease)   Relevant Medications   omeprazole (PRILOSEC) 20 MG capsule     Other   Depression   Relevant Medications   escitalopram (LEXAPRO) 20 MG tablet   Hyperlipidemia LDL goal <130 - Primary   Relevant Medications   valsartan-hydrochlorothiazide (DIOVAN-HCT) 320-12.5 MG tablet   atorvastatin (LIPITOR) 80 MG tablet   amLODipine (NORVASC) 5 MG tablet       Follow up plan: Return in about 4 months (around 03/13/2016), or if symptoms worsen or fail to improve, for Hypertension and GERD follow-up with.  Counseling provided for all of the vaccine components No orders of the defined types were placed in this encounter.    Caryl Pina, MD [facility] 11/12/2015, 8:44 AM

## 2016-03-14 ENCOUNTER — Ambulatory Visit: Payer: Medicare Other | Admitting: Family Medicine

## 2016-04-20 DIAGNOSIS — Z23 Encounter for immunization: Secondary | ICD-10-CM | POA: Diagnosis not present

## 2016-06-23 ENCOUNTER — Encounter: Payer: Self-pay | Admitting: Family Medicine

## 2016-07-04 ENCOUNTER — Ambulatory Visit (INDEPENDENT_AMBULATORY_CARE_PROVIDER_SITE_OTHER): Payer: Medicare Other | Admitting: Family

## 2016-07-04 ENCOUNTER — Encounter: Payer: Self-pay | Admitting: Family Medicine

## 2016-07-04 ENCOUNTER — Encounter: Payer: Self-pay | Admitting: Family

## 2016-07-04 VITALS — BP 127/81 | HR 98 | Temp 98.7°F | Ht 73.0 in | Wt 252.8 lb

## 2016-07-04 DIAGNOSIS — J0100 Acute maxillary sinusitis, unspecified: Secondary | ICD-10-CM | POA: Diagnosis not present

## 2016-07-04 MED ORDER — AMOXICILLIN-POT CLAVULANATE 875-125 MG PO TABS: 1.0000 | ORAL_TABLET | Freq: Two times a day (BID) | ORAL | 0 refills | Status: DC

## 2016-07-04 NOTE — Patient Instructions (Signed)

## 2016-07-04 NOTE — Progress Notes (Signed)
Subjective:    Patient ID: [name], male    DOB: 1951-07-21, 65 y.o.   MRN: [id number]  Sinus Problem  This is a new problem. The current episode started 1 to 4 weeks ago. The problem has been gradually worsening since onset. The maximum temperature recorded prior to his arrival was 100.4 - 100.9 F. Associated symptoms include chills, congestion, coughing, ear pain, a hoarse voice, shortness of breath, sinus pressure, sneezing and a sore throat. Pertinent negatives include no headaches. Past treatments include acetaminophen, lying down and oral decongestants. The treatment provided no relief.  Fever   Associated symptoms include congestion, coughing, ear pain and a sore throat. Pertinent negatives include no headaches.  Cough  Associated symptoms include chills, ear pain, a fever, a sore throat and shortness of breath. Pertinent negatives include no headaches.      Review of Systems  Constitutional: Positive for chills and fever.  HENT: Positive for congestion, ear pain, hoarse voice, sinus pressure, sneezing and sore throat.   Respiratory: Positive for cough and shortness of breath.   Neurological: Negative for headaches.  All other systems reviewed and are negative.      Objective:   Physical Exam  Constitutional: He is oriented to person, place, and time. He appears well-developed and well-nourished. No distress.  HENT:  Head: Normocephalic.  Right Ear: External ear normal.  Left Ear: External ear normal.  Nose: Mucosal edema and rhinorrhea present. Right sinus exhibits maxillary sinus tenderness and frontal sinus tenderness. Left sinus exhibits maxillary sinus tenderness and frontal sinus tenderness.  Mouth/Throat: Posterior oropharyngeal edema and posterior oropharyngeal erythema present.  Eyes: Pupils are equal, round, and reactive to light. Right eye exhibits no discharge. Left eye exhibits no discharge.  Neck: Normal range of motion. Neck supple. No thyromegaly  present.  Cardiovascular: Normal rate, regular rhythm, normal heart sounds and intact distal pulses.   No murmur heard. Pulmonary/Chest: Effort normal and breath sounds normal. No respiratory distress. He has no wheezes.  Nonproductive cough  Abdominal: Soft. Bowel sounds are normal. He exhibits no distension. There is no tenderness.  Musculoskeletal: Normal range of motion. He exhibits no edema or tenderness.  Neurological: He is alert and oriented to person, place, and time. He has normal reflexes. No cranial nerve deficit.  Skin: Skin is warm and dry. No rash noted. No erythema.  Psychiatric: He has a normal mood and affect. His behavior is normal. Judgment and thought content normal.  Vitals reviewed.     BP 127/81   Pulse 98   Temp 98.7 F (37.1 C) (Oral)   Ht 6\' 1"  (1.854 m)   Wt 252 lb 12.8 oz (114.7 kg)   BMI 33.35 kg/m      Assessment & Plan:  1. Acute maxillary sinusitis, recurrence not specified -- Take meds as prescribed - Use a cool mist humidifier  -Use saline nose sprays frequently -Saline irrigations of the nose can be very helpful if done frequently.  * 4X daily for 1 week*  * Use of a nettie pot can be helpful with this. Follow directions with this* -Force fluids -For any cough or congestion  Use plain Mucinex- regular strength or max strength is fine   * Children- consult with Pharmacist for dosing -For fever or aces or pains- take tylenol or ibuprofen appropriate for age and weight.  * for fevers greater than 101 orally you may alternate ibuprofen and tylenol every  3 hours. -Throat lozenges if help - amoxicillin-clavulanate (AUGMENTIN) 875-125  MG tablet; Take 1 tablet by mouth 2 (two) times daily.  Dispense: 14 tablet; Refill: 0  Evelina Dun, FNP

## 2016-07-25 ENCOUNTER — Ambulatory Visit (INDEPENDENT_AMBULATORY_CARE_PROVIDER_SITE_OTHER): Payer: Medicare Other | Admitting: Family Medicine

## 2016-07-25 ENCOUNTER — Encounter: Payer: Self-pay | Admitting: Family Medicine

## 2016-07-25 VITALS — BP 131/89 | HR 71 | Temp 97.5°F | Ht 73.0 in | Wt 250.4 lb

## 2016-07-25 DIAGNOSIS — F3341 Major depressive disorder, recurrent, in partial remission: Secondary | ICD-10-CM | POA: Diagnosis not present

## 2016-07-25 DIAGNOSIS — E785 Hyperlipidemia, unspecified: Secondary | ICD-10-CM

## 2016-07-25 DIAGNOSIS — R7309 Other abnormal glucose: Secondary | ICD-10-CM

## 2016-07-25 DIAGNOSIS — Z1159 Encounter for screening for other viral diseases: Secondary | ICD-10-CM

## 2016-07-25 DIAGNOSIS — I10 Essential (primary) hypertension: Secondary | ICD-10-CM

## 2016-07-25 DIAGNOSIS — Z1211 Encounter for screening for malignant neoplasm of colon: Secondary | ICD-10-CM | POA: Diagnosis not present

## 2016-07-25 DIAGNOSIS — N529 Male erectile dysfunction, unspecified: Secondary | ICD-10-CM

## 2016-07-25 DIAGNOSIS — Z23 Encounter for immunization: Secondary | ICD-10-CM

## 2016-07-25 DIAGNOSIS — K641 Second degree hemorrhoids: Secondary | ICD-10-CM | POA: Diagnosis not present

## 2016-07-25 MED ORDER — ESCITALOPRAM OXALATE 20 MG PO TABS: 20.0000 mg | ORAL_TABLET | Freq: Every day | ORAL | 2 refills | Status: DC

## 2016-07-25 MED ORDER — SILDENAFIL CITRATE 100 MG PO TABS: 50.0000 mg | ORAL_TABLET | Freq: Every day | ORAL | 11 refills | Status: DC | PRN

## 2016-07-25 NOTE — Progress Notes (Signed)
BP 131/89   Pulse 71   Temp 97.5 F (36.4 C) (Oral)   Ht _0  (1.854 m)   Wt 250 lb 6 oz (113.6 kg)   BMI 33.03 kg/m    Subjective:    Patient ID: [name], male    DOB: 23-Aug-1950, 65 y.o.   MRN: [id number]  HPI: Georges Victorio is a 65 y.o. male presenting on 07/25/2016 for Hypertension (followup); Hyperlipidemia (patient is fasting); Depression; and Concerns (bump in scrotal area; some periods of weakness of arms and legs - lasting about 3-4 minutes and then going away, happened once or twice.  )   HPI Hypertension Patient is coming in for hypertension check. His blood pressure is 131/89. He is currently on Norvasc and Diovan HCT. Patient denies headaches, blurred vision, chest pains, shortness of breath, or weakness. Denies any side effects from medication and is content with current medication.   Hyperlipidemia Patient is coming in for recheck on cholesterol. He is currently on Lipitor 80 mg. He denies any issues with myalgias or liver problems that he knows of. He denies any focal numbness or weakness or chest pains or palpitations. He is due for cholesterol recheck.  Depression recheck Patient is coming in for a recheck on his depression. He has been on Lexapro 20. He tried to self weaning himself off of it but realized that he does still need it and went back on it. He denies any issues with the medication itself besides erectile dysfunction and decreased libido. He feels like his anxiety and feelings of depression and sadness are very well controlled on the medication. He also says that his family feels like he is doing a lot better on the medication than off of it. He denies any suicidal ideations. Sometimes he does have issues with sleep but not frequently.  Erectile dysfunction Patient feels like he has had decreased libido and more difficulty getting an erection and keeping erection since he has been on the Lexapro which is why he tried to wean himself off of it. He feels  like he does need the Lexapro but would like to try something to help him perform better sexually. He denies any urinary issues or difficult is with stream or urinary frequency. He wakes up once a night to go urinate.  Rectal pain and bulging Patient also comes in complaining of rectal pain and bulging. He says this is been going on intermittently for the past few months but he feels like it is getting more problematic with bowel movements. He does admit to constipation and having to strain a lot for bowel movements and has had that issue for quite some time. He has tried some over-the-counter creams for fibroids. He feels like it helped some but not completely. He denies any rectal bleeding or blood in the stool.  Relevant past medical, surgical, family and social history reviewed and updated as indicated. Interim medical history since our last visit reviewed. Allergies and medications reviewed and updated.  Review of Systems  Constitutional: Negative for chills and fever.  Respiratory: Negative for shortness of breath and wheezing.   Cardiovascular: Negative for chest pain and leg swelling.  Gastrointestinal: Positive for constipation and rectal pain. Negative for abdominal pain, anal bleeding, blood in stool and diarrhea.  Genitourinary: Negative for difficulty urinating, dysuria, frequency and urgency.  Musculoskeletal: Negative for back pain and gait problem.  Skin: Negative for rash.  Neurological: Negative for dizziness, light-headedness and numbness.  Psychiatric/Behavioral: Negative for decreased concentration,  dysphoric mood, self-injury, sleep disturbance and suicidal ideas. The patient is not nervous/anxious.   All other systems reviewed and are negative.   Per HPI unless specifically indicated above      Objective:    BP 131/89   Pulse 71   Temp 97.5 F (36.4 C) (Oral)   Ht _0  (1.854 m)   Wt 250 lb 6 oz (113.6 kg)   BMI 33.03 kg/m   Wt Readings from Last 3  Encounters:  07/25/16 250 lb 6 oz (113.6 kg)  07/04/16 252 lb 12.8 oz (114.7 kg)  11/12/15 250 lb (113.4 kg)    Physical Exam  Constitutional: He is oriented to person, place, and time. He appears well-developed and well-nourished. No distress.  Eyes: Conjunctivae are normal. Right eye exhibits no discharge. Left eye exhibits no discharge. No scleral icterus.  Cardiovascular: Normal rate, regular rhythm, normal heart sounds and intact distal pulses.   No murmur heard. Pulmonary/Chest: Effort normal and breath sounds normal. No respiratory distress. He has no wheezes. He has no rales.  Abdominal: Soft. Bowel sounds are normal. He exhibits no distension. There is no tenderness. There is no rebound.  Genitourinary: Prostate normal. Rectal exam shows external hemorrhoid, internal hemorrhoid and tenderness. Rectal exam shows anal tone normal.  Musculoskeletal: Normal range of motion. He exhibits no edema.  Neurological: He is alert and oriented to person, place, and time. Coordination normal.  Skin: Skin is warm and dry. No rash noted. He is not diaphoretic.  Psychiatric: He has a normal mood and affect. His behavior is normal. Thought content normal. His mood appears not anxious. He does not exhibit a depressed mood. He expresses no suicidal ideation. He expresses no suicidal plans.  Nursing note and vitals reviewed.     Assessment & Plan:   Problem List Items Addressed This Visit      Cardiovascular and Mediastinum   Essential hypertension, benign - Primary   Relevant Orders   CMP14+EGFR (Completed)     Other   Depression   Relevant Medications   escitalopram (LEXAPRO) 20 MG tablet   Hyperlipidemia LDL goal <130   Relevant Orders   Lipid panel (Completed)    Other Visit Diagnoses    Grade II hemorrhoids       Recommended hemorrhoid cream and MiraLAX and hydration for constipation, also recommended increasing fiber   Erectile dysfunction, unspecified erectile dysfunction type         Special screening for malignant neoplasms, colon       Relevant Orders   Ambulatory referral to Gastroenterology   Encounter for hepatitis C screening test for low risk patient       Relevant Orders   Hepatitis C antibody (Completed)   Elevated glucose         continue all current meds.  Follow up plan: Return in about 4 months (around 11/23/2016), or if symptoms worsen or fail to improve, for Hypertension and cholesterol recheck.  Counseling provided for all of the vaccine components Orders Placed This Encounter  Procedures  . CMP14+EGFR  . Lipid panel  . Hepatitis C antibody  . Ambulatory referral to Gastroenterology    Caryl Pina, MD [facility] 07/25/2016, 8:44 AM

## 2016-07-26 ENCOUNTER — Encounter: Payer: Self-pay | Admitting: Family Medicine

## 2016-07-26 LAB — HEPATITIS C ANTIBODY: Hep C Virus Ab: <0.1 s/co ratio (ref 0.0–0.9)

## 2016-07-26 LAB — CMP14+EGFR
ALBUMIN: 4.3 g/dL (ref 3.6–4.8)
ALK PHOS: 119 IU/L — AB (ref 39–117)
ALT: 21 IU/L (ref 0–44)
AST: 21 IU/L (ref 0–40)
Albumin/Globulin Ratio: 1.7 (ref 1.2–2.2)
BILIRUBIN TOTAL: 0.6 mg/dL (ref 0.0–1.2)
BUN / CREAT RATIO: 14 (ref 10–24)
BUN: 11 mg/dL (ref 8–27)
CHLORIDE: 98 mmol/L (ref 96–106)
CO2: 27 mmol/L (ref 18–29)
Calcium: 9.7 mg/dL (ref 8.6–10.2)
Creatinine, Ser: 0.76 mg/dL (ref 0.76–1.27)
GFR calc Af Amer: 109 mL/min/{1.73_m2} (ref >59)
GFR calc non Af Amer: 94 mL/min/{1.73_m2} (ref >59)
GLOBULIN, TOTAL: 2.6 g/dL (ref 1.5–4.5)
GLUCOSE: 102 mg/dL — AB (ref 65–99)
Potassium: 4 mmol/L (ref 3.5–5.2)
SODIUM: 139 mmol/L (ref 134–144)
Total Protein: 6.9 g/dL (ref 6.0–8.5)

## 2016-07-26 LAB — LIPID PANEL
CHOLESTEROL TOTAL: 134 mg/dL (ref 100–199)
Chol/HDL Ratio: 3.3 ratio units (ref 0.0–5.0)
HDL: 41 mg/dL (ref >39)
LDL Calculated: 73 mg/dL (ref 0–99)
Triglycerides: 98 mg/dL (ref 0–149)
VLDL Cholesterol Cal: 20 mg/dL (ref 5–40)

## 2016-07-27 ENCOUNTER — Other Ambulatory Visit: Payer: Self-pay | Admitting: Nurse Practitioner

## 2016-07-27 ENCOUNTER — Encounter: Payer: Self-pay | Admitting: Internal Medicine

## 2016-07-27 ENCOUNTER — Other Ambulatory Visit: Payer: Self-pay

## 2016-07-27 ENCOUNTER — Telehealth: Payer: Self-pay | Admitting: Family Medicine

## 2016-07-27 ENCOUNTER — Other Ambulatory Visit: Payer: Self-pay | Admitting: *Deleted

## 2016-07-27 DIAGNOSIS — N529 Male erectile dysfunction, unspecified: Secondary | ICD-10-CM

## 2016-07-27 DIAGNOSIS — R7309 Other abnormal glucose: Secondary | ICD-10-CM

## 2016-07-27 LAB — BAYER DCA HB A1C WAIVED: HB A1C: 5.7 % (ref <7.0)

## 2016-07-27 MED ORDER — SILDENAFIL CITRATE 100 MG PO TABS: 50.0000 mg | ORAL_TABLET | Freq: Every day | ORAL | 3 refills | Status: DC | PRN

## 2016-07-27 MED ORDER — SILDENAFIL CITRATE 100 MG PO TABS: 50.0000 mg | ORAL_TABLET | Freq: Every day | ORAL | 1 refills | Status: DC | PRN

## 2016-07-28 MED ORDER — SILDENAFIL CITRATE 20 MG PO TABS: ORAL_TABLET | ORAL | 1 refills | Status: DC

## 2016-09-10 ENCOUNTER — Emergency Department (HOSPITAL_COMMUNITY): Payer: Medicare Other

## 2016-09-10 ENCOUNTER — Inpatient Hospital Stay (HOSPITAL_COMMUNITY)
Admission: EM | Admit: 2016-09-10 | Discharge: 2016-09-14 | DRG: 176 | Disposition: A | Discharge status: Home / Self Care | Payer: Medicare Other | Attending: Internal Medicine | Admitting: Internal Medicine

## 2016-09-10 ENCOUNTER — Emergency Department (HOSPITAL_COMMUNITY): Admit: 2016-09-10 | Discharge: 2016-09-10 | Disposition: A | Discharge status: Home / Self Care | Payer: Medicare Other

## 2016-09-10 ENCOUNTER — Encounter (HOSPITAL_COMMUNITY): Payer: Self-pay | Admitting: Emergency Medicine

## 2016-09-10 ENCOUNTER — Ambulatory Visit (INDEPENDENT_AMBULATORY_CARE_PROVIDER_SITE_OTHER): Payer: Medicare Other | Admitting: Physician Assistant

## 2016-09-10 VITALS — BP 132/85 | HR 90 | Temp 100.9°F | Ht 73.0 in | Wt 249.0 lb

## 2016-09-10 DIAGNOSIS — I10 Essential (primary) hypertension: Secondary | ICD-10-CM | POA: Diagnosis present

## 2016-09-10 DIAGNOSIS — I82403 Acute embolism and thrombosis of unspecified deep veins of lower extremity, bilateral: Secondary | ICD-10-CM | POA: Diagnosis present

## 2016-09-10 DIAGNOSIS — M7989 Other specified soft tissue disorders: Secondary | ICD-10-CM

## 2016-09-10 DIAGNOSIS — Z79899 Other long term (current) drug therapy: Secondary | ICD-10-CM | POA: Diagnosis not present

## 2016-09-10 DIAGNOSIS — I2692 Saddle embolus of pulmonary artery without acute cor pulmonale: Secondary | ICD-10-CM | POA: Diagnosis not present

## 2016-09-10 DIAGNOSIS — R609 Edema, unspecified: Secondary | ICD-10-CM

## 2016-09-10 DIAGNOSIS — Z86711 Personal history of pulmonary embolism: Secondary | ICD-10-CM | POA: Diagnosis present

## 2016-09-10 DIAGNOSIS — I452 Bifascicular block: Secondary | ICD-10-CM | POA: Diagnosis present

## 2016-09-10 DIAGNOSIS — F329 Major depressive disorder, single episode, unspecified: Secondary | ICD-10-CM | POA: Diagnosis present

## 2016-09-10 DIAGNOSIS — Z7982 Long term (current) use of aspirin: Secondary | ICD-10-CM

## 2016-09-10 DIAGNOSIS — R071 Chest pain on breathing: Secondary | ICD-10-CM

## 2016-09-10 DIAGNOSIS — E871 Hypo-osmolality and hyponatremia: Secondary | ICD-10-CM | POA: Diagnosis not present

## 2016-09-10 DIAGNOSIS — I824Y3 Acute embolism and thrombosis of unspecified deep veins of proximal lower extremity, bilateral: Secondary | ICD-10-CM | POA: Diagnosis not present

## 2016-09-10 DIAGNOSIS — D649 Anemia, unspecified: Secondary | ICD-10-CM | POA: Diagnosis present

## 2016-09-10 DIAGNOSIS — R52 Pain, unspecified: Secondary | ICD-10-CM

## 2016-09-10 DIAGNOSIS — I2699 Other pulmonary embolism without acute cor pulmonale: Secondary | ICD-10-CM | POA: Diagnosis not present

## 2016-09-10 DIAGNOSIS — F32A Depression, unspecified: Secondary | ICD-10-CM | POA: Diagnosis present

## 2016-09-10 DIAGNOSIS — E785 Hyperlipidemia, unspecified: Secondary | ICD-10-CM | POA: Diagnosis present

## 2016-09-10 DIAGNOSIS — F3341 Major depressive disorder, recurrent, in partial remission: Secondary | ICD-10-CM | POA: Diagnosis not present

## 2016-09-10 DIAGNOSIS — K219 Gastro-esophageal reflux disease without esophagitis: Secondary | ICD-10-CM | POA: Diagnosis not present

## 2016-09-10 DIAGNOSIS — J189 Pneumonia, unspecified organism: Secondary | ICD-10-CM | POA: Diagnosis not present

## 2016-09-10 DIAGNOSIS — Z86718 Personal history of other venous thrombosis and embolism: Secondary | ICD-10-CM | POA: Diagnosis present

## 2016-09-10 DIAGNOSIS — R0602 Shortness of breath: Secondary | ICD-10-CM | POA: Diagnosis not present

## 2016-09-10 LAB — BASIC METABOLIC PANEL
ANION GAP: 10 (ref 5–15)
BUN: 10 mg/dL (ref 6–20)
CALCIUM: 9.7 mg/dL (ref 8.9–10.3)
CO2: 25 mmol/L (ref 22–32)
CREATININE: 0.8 mg/dL (ref 0.61–1.24)
Chloride: 97 mmol/L — ABNORMAL LOW (ref 101–111)
Glucose, Bld: 108 mg/dL — ABNORMAL HIGH (ref 65–99)
Potassium: 3.9 mmol/L (ref 3.5–5.1)
SODIUM: 132 mmol/L — AB (ref 135–145)

## 2016-09-10 LAB — CBC
HCT: 37.1 % — ABNORMAL LOW (ref 39.0–52.0)
HEMOGLOBIN: 12.5 g/dL — AB (ref 13.0–17.0)
MCH: 29.9 pg (ref 26.0–34.0)
MCHC: 33.7 g/dL (ref 30.0–36.0)
MCV: 88.8 fL (ref 78.0–100.0)
PLATELETS: 182 10*3/uL (ref 150–400)
RBC: 4.18 MIL/uL — AB (ref 4.22–5.81)
RDW: 13.1 % (ref 11.5–15.5)
WBC: 10 10*3/uL (ref 4.0–10.5)

## 2016-09-10 LAB — I-STAT TROPONIN, ED: TROPONIN I, POC: 0.08 ng/mL (ref 0.00–0.08)

## 2016-09-10 MED ORDER — POLYVINYL ALCOHOL 1.4 % OP SOLN
1.0000 [drp] | Freq: Two times a day (BID) | OPHTHALMIC | Status: DC | PRN
  Filled 2016-09-10: qty 15

## 2016-09-10 MED ORDER — FAMOTIDINE IN NACL 20-0.9 MG/50ML-% IV SOLN
20.0000 mg | Freq: Two times a day (BID) | INTRAVENOUS | Status: DC
  Administered 2016-09-10 – 2016-09-12 (×4): 20 mg via INTRAVENOUS
  Filled 2016-09-10 (×4): qty 50

## 2016-09-10 MED ORDER — ATORVASTATIN CALCIUM 80 MG PO TABS
80.0000 mg | ORAL_TABLET | Freq: Every day | ORAL | Status: DC
  Administered 2016-09-10 – 2016-09-13 (×4): 80 mg via ORAL
  Filled 2016-09-10 (×4): qty 1

## 2016-09-10 MED ORDER — ADULT MULTIVITAMIN W/MINERALS CH
1.0000 | ORAL_TABLET | Freq: Every day | ORAL | Status: DC
  Administered 2016-09-10 – 2016-09-13 (×4): 1 via ORAL
  Filled 2016-09-10 (×4): qty 1

## 2016-09-10 MED ORDER — HEPARIN BOLUS VIA INFUSION
4000.0000 [IU] | Freq: Once | INTRAVENOUS | Status: AC
  Administered 2016-09-10: 4000 [IU] via INTRAVENOUS
  Filled 2016-09-10: qty 4000

## 2016-09-10 MED ORDER — ONDANSETRON HCL 4 MG PO TABS: 4.0000 mg | ORAL_TABLET | Freq: Four times a day (QID) | ORAL | Status: DC | PRN

## 2016-09-10 MED ORDER — PANTOPRAZOLE SODIUM 40 MG PO TBEC
40.0000 mg | DELAYED_RELEASE_TABLET | Freq: Every day | ORAL | Status: DC
  Administered 2016-09-11 – 2016-09-14 (×4): 40 mg via ORAL
  Filled 2016-09-10 (×4): qty 1

## 2016-09-10 MED ORDER — BISACODYL 5 MG PO TBEC
5.0000 mg | DELAYED_RELEASE_TABLET | Freq: Every day | ORAL | Status: DC | PRN
  Administered 2016-09-10: 5 mg via ORAL
  Filled 2016-09-10: qty 1

## 2016-09-10 MED ORDER — POLYVINYL ALCOHOL-POVIDONE 1.4-0.6 % OP SOLN: 1.0000 [drp] | Freq: Two times a day (BID) | OPHTHALMIC | Status: DC | PRN

## 2016-09-10 MED ORDER — HEPARIN (PORCINE) IN NACL 100-0.45 UNIT/ML-% IJ SOLN
2000.0000 [IU]/h | INTRAMUSCULAR | Status: DC
  Administered 2016-09-10: 1450 [IU]/h via INTRAVENOUS
  Administered 2016-09-11: 1600 [IU]/h via INTRAVENOUS
  Administered 2016-09-12: 1800 [IU]/h via INTRAVENOUS
  Administered 2016-09-13: 2000 [IU]/h via INTRAVENOUS
  Filled 2016-09-10 (×6): qty 250

## 2016-09-10 MED ORDER — HYDROCODONE-ACETAMINOPHEN 5-325 MG PO TABS: 1.0000 | ORAL_TABLET | ORAL | Status: DC | PRN

## 2016-09-10 MED ORDER — POLYETHYLENE GLYCOL 3350 17 G PO PACK: 17.0000 g | PACK | Freq: Every day | ORAL | Status: DC | PRN

## 2016-09-10 MED ORDER — SODIUM CHLORIDE 0.9% FLUSH
3.0000 mL | Freq: Two times a day (BID) | INTRAVENOUS | Status: DC
  Administered 2016-09-11 – 2016-09-14 (×2): 3 mL via INTRAVENOUS

## 2016-09-10 MED ORDER — ONDANSETRON HCL 4 MG/2ML IJ SOLN: 4.0000 mg | Freq: Four times a day (QID) | INTRAMUSCULAR | Status: DC | PRN

## 2016-09-10 MED ORDER — ACETAMINOPHEN 325 MG PO TABS
650.0000 mg | ORAL_TABLET | Freq: Four times a day (QID) | ORAL | Status: DC | PRN
  Administered 2016-09-11 – 2016-09-12 (×2): 650 mg via ORAL
  Filled 2016-09-10 (×2): qty 2

## 2016-09-10 MED ORDER — HYDROMORPHONE HCL 2 MG/ML IJ SOLN
1.0000 mg | INTRAMUSCULAR | Status: DC | PRN
Start: 2016-09-10 — End: 2016-09-14
  Administered 2016-09-10: 1 mg via INTRAVENOUS
  Filled 2016-09-10: qty 1

## 2016-09-10 MED ORDER — SODIUM CHLORIDE 0.9 % IV SOLN
INTRAVENOUS | Status: AC
  Administered 2016-09-10: 23:00:00 via INTRAVENOUS

## 2016-09-10 MED ORDER — IOPAMIDOL (ISOVUE-370) INJECTION 76%
INTRAVENOUS | Status: AC
  Administered 2016-09-10: 100 mL
  Filled 2016-09-10: qty 100

## 2016-09-10 MED ORDER — ESCITALOPRAM OXALATE 10 MG PO TABS
20.0000 mg | ORAL_TABLET | Freq: Every day | ORAL | Status: DC
Start: 2016-09-11 — End: 2016-09-14
  Administered 2016-09-11 – 2016-09-14 (×4): 20 mg via ORAL
  Filled 2016-09-10 (×4): qty 2

## 2016-09-10 MED ORDER — HYDROMORPHONE HCL 2 MG/ML IJ SOLN
1.0000 mg | Freq: Once | INTRAMUSCULAR | Status: AC
  Administered 2016-09-10: 1 mg via INTRAVENOUS
  Filled 2016-09-10: qty 1

## 2016-09-10 MED ORDER — AMLODIPINE BESYLATE 5 MG PO TABS
5.0000 mg | ORAL_TABLET | Freq: Every day | ORAL | Status: DC
  Administered 2016-09-11 – 2016-09-14 (×4): 5 mg via ORAL
  Filled 2016-09-10 (×4): qty 1

## 2016-09-10 MED ORDER — IRBESARTAN 300 MG PO TABS
300.0000 mg | ORAL_TABLET | Freq: Every day | ORAL | Status: DC
  Administered 2016-09-10 – 2016-09-14 (×5): 300 mg via ORAL
  Filled 2016-09-10: qty 2
  Filled 2016-09-10: qty 1
  Filled 2016-09-10 (×2): qty 2
  Filled 2016-09-10 (×2): qty 1
  Filled 2016-09-10: qty 2
  Filled 2016-09-10 (×2): qty 1

## 2016-09-10 MED ORDER — ACETAMINOPHEN 650 MG RE SUPP: 650.0000 mg | Freq: Four times a day (QID) | RECTAL | Status: DC | PRN

## 2016-09-10 MED ORDER — ZOLPIDEM TARTRATE 5 MG PO TABS: 5.0000 mg | ORAL_TABLET | Freq: Every evening | ORAL | Status: DC | PRN

## 2016-09-10 MED ORDER — KETOROLAC TROMETHAMINE 15 MG/ML IJ SOLN
15.0000 mg | Freq: Four times a day (QID) | INTRAMUSCULAR | Status: DC
  Administered 2016-09-10 – 2016-09-14 (×13): 15 mg via INTRAVENOUS
  Filled 2016-09-10 (×13): qty 1

## 2016-09-10 NOTE — ED Notes (Signed)
Patient transported to CT 

## 2016-09-10 NOTE — ED Notes (Signed)
Vascular study in process.

## 2016-09-10 NOTE — ED Notes (Signed)
Called 2W to give report. Receiving RN declined to accept the patient because he was having non cardiac chest pain. Agricultural consultant and [facility] notified.

## 2016-09-10 NOTE — Progress Notes (Signed)
ANTICOAGULATION CONSULT NOTE - Initial Consult  Pharmacy Consult for heparin Indication: pulmonary embolus  No Known Allergies  Patient Measurements: Height: 6\' 1"  (185.4 cm) Weight: 249 lb (112.9 kg) IBW/kg (Calculated) : 79.9 Heparin Dosing Weight: 90 kg  Vital Signs: Temp: 99.3 F (37.4 C) (01/20 1409) Temp Source: Oral (01/20 1409) BP: 140/76 (01/20 1645) Pulse Rate: 85 (01/20 1645)  Labs:  Recent Labs  09/10/16 1413  HGB 12.5*  HCT 37.1*  PLT 182  CREATININE 0.80    Estimated Creatinine Clearance: 118 mL/min (by C-G formula based on SCr of 0.8 mg/dL).   Medical History: Past Medical History:  Diagnosis Date  . Depression   . GERD (gastroesophageal reflux disease)   . 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 Hyperlipidemia   . Hypertension     Assessment: Heparin gtt for acute DVT in L lower leg. Hgb 12.5, plts wnl.  Possible PE, CT done, awaiting results.   Goal of Therapy:  Heparin level 0.3-0.7 units/ml Monitor platelets by anticoagulation protocol: Yes    Plan:  -Heparin bolus 4000 units x1 then 1450 units/hr  -Daily HL, CBC -First level tonight    Harvel Quale 09/10/2016,5:11 PM

## 2016-09-10 NOTE — ED Notes (Addendum)
AC returned call and states that the patient needs a stepdown bed.  Dr Myna Hidalgo notified

## 2016-09-10 NOTE — Progress Notes (Signed)
Preliminary results by tech - Venous Duplex Lower Ext. Completed. Right leg, positive for acute deep vein thrombosis involving the mid and distal femoral vein, popliteal vein and the posterior tibial and peroneal veins. Left leg, positive for acute deep vein thrombosis involving the posterior tibial and peroneal veins. Results given to patient's nurse, Burna Mortimer. Oda Cogan, BS, RDMS, RVT

## 2016-09-10 NOTE — H&P (Signed)
History and Physical    [name] [id number] DOB: 25-Mar-1951 DOA: 09/10/2016  PCP: Fransisca Kaufmann Dettinger, MD   Patient coming from: Home, by way of PCP clinic   Chief Complaint: Dyspnea, pleuritic pain, RLE swelling and tenderness  HPI: [name] is a 66 y.o. male with medical history significant for hypertension, GERD, and depression, now presenting to the ED from his PCP office for evaluation of shortness of breath, pleuritic pain, and right leg swelling and tenderness. Patient reports that he had been in his usual state of health until approximately 3-4 weeks ago when he developed swelling and tenderness in the right lower extremity without history of preceding trauma. These symptoms persisted and he soon developed some dyspnea with left lateral chest pain with deep inspiration or cough. No pleuritic pain and dyspnea worsened acutely over the past day, prompting his presentation to a same-day clinic today. He was noted to be febrile at the office visit, but the history was very concerning for DVT and PE, and so he was directed to the emergency department. Patient reports a history of VTE in his mother, but denies personal history of such and denies any recent immobilization, long distance travel, trauma, or new medication. He is a nonsmoker and does not use hormone therapy. He had a colonoscopy performed in 2005 and is scheduled for a repeat routine screening colonoscopy in February 2018. He reports frequent urination chronically which had been attributed to his prostate and he underwent a TURP approximately 8 years ago in [place], [place] with biopsies reportedly negative for malignancy at that time.  ED Course: Upon arrival to the ED, patient is found to have temp of 37.8 C, D saturating adequately on room air, and with vitals otherwise stable. 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 Chest x-ray demonstrates some left  lower lobe opacities, possibly representing a pneumonia. Chemistry panel was notable for a sodium of 132 and chloride of 97 and CBC features a normocytic anemia with hemoglobin 12.5. Troponin is within the normal limits. Patient was treated with Dilaudid for his pain and CT angiogram PE study was obtained and positive for multilevel lobar PE with moderate to severe overall clot burden, but no evidence for right heart strain. Preliminary results of lower extremity ultrasound is positive for bilateral lower extremity DVT. Patient was started on heparin infusion, remained hemodynamically stable and in no acute respiratory distress, and will be admitted to the telemetry unit for ongoing evaluation and management of acute multilobar PE without right heart strain, and bilateral lower extremity DVTs.  Review of Systems:  All other systems reviewed and apart from HPI, are negative.  Past Medical History:  Diagnosis Date  . Depression   . GERD (gastroesophageal reflux disease)   . 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 Hyperlipidemia   . Hypertension     Past Surgical History:  Procedure Laterality Date  . adnoidectomy    . APPENDECTOMY    . PROSTATE SURGERY    . TONSILLECTOMY AND ADENOIDECTOMY       reports that he has never smoked. He has never used smokeless tobacco. He reports that he drinks alcohol. He reports that he does not use drugs.  No Known Allergies  Family History  Problem Relation Age of Onset  . COPD Mother   . Heart disease Mother   . AAA (abdominal aortic aneurysm) Mother   . Heart disease Father   .  Depression Father   . Diabetes Father   . Heart disease Brother   . Early death Brother     heart attack  . Heart disease Maternal Grandmother   . Heart disease Maternal Grandfather   . Stroke Paternal Grandmother   . Heart disease Brother   . Diabetes Brother      Prior to Admission medications   Medication Sig Start Date End Date Taking? Authorizing Provider    amLODipine (NORVASC) 5 MG tablet Take 1 tablet (5 mg total) by mouth daily. 11/12/15  Yes Fransisca Kaufmann Dettinger, MD  aspirin (ECOTRIN) 325 MG EC tablet Take 325 mg by mouth daily after supper.   Yes Historical Provider, MD  aspirin-acetaminophen-caffeine (EXCEDRIN EXTRA STRENGTH) [phone] MG tablet Take 2 tablets by mouth daily.   Yes Historical Provider, MD  atorvastatin (LIPITOR) 80 MG tablet Take 1 tablet (80 mg total) by mouth daily. Patient taking differently: Take 80 mg by mouth at bedtime.  11/12/15  Yes Fransisca Kaufmann Dettinger, MD  Coenzyme Q10-Vitamin E (QUNOL ULTRA COQ10 PO) Take by mouth daily.   Yes Historical Provider, MD  Cyanocobalamin (VITAMIN B-12 PO) Take 1 tablet by mouth daily.   Yes Historical Provider, MD  escitalopram (LEXAPRO) 20 MG tablet Take 1 tablet (20 mg total) by mouth daily. 07/25/16  Yes Fransisca Kaufmann Dettinger, MD  Multiple Vitamin (MULTIVITAMIN WITH MINERALS) TABS tablet Take 1 tablet by mouth at bedtime.   Yes Historical Provider, MD  omeprazole (PRILOSEC) 20 MG capsule Take 1 capsule (20 mg total) by mouth daily. 11/12/15  Yes Fransisca Kaufmann Dettinger, MD  Polyvinyl Alcohol-Povidone (REFRESH OP) Place 1 drop into both eyes 2 (two) times daily as needed (dry eyes).   Yes Historical Provider, MD  Pseudoephedrine HCl (SUDAFED 12 HOUR PO) Take 1 tablet by mouth at bedtime as needed (sinus congestion).   Yes Historical Provider, MD  sildenafil (VIAGRA) 100 MG tablet Take 0.5-1 tablets (50-100 mg total) by mouth daily as needed for erectile dysfunction. Patient taking differently: Take 50 mg by mouth daily as needed for erectile dysfunction.  07/27/16  Yes Mary-Margaret Hassell Done, FNP  TURMERIC PO Take 3 capsules by mouth daily.   Yes Historical Provider, MD  valsartan-hydrochlorothiazide (DIOVAN-HCT) 320-12.5 MG tablet Take 1 tablet by mouth daily. 11/12/15  Yes Fransisca Kaufmann Dettinger, MD  diclofenac (VOLTAREN) 75 MG EC tablet Take 1 tablet (75 mg total) by mouth 2 (two) times daily as  needed. Patient not taking: Reported on 09/10/2016 11/12/15   Fransisca Kaufmann Dettinger, MD    Physical Exam: Vitals:   09/10/16 1645 09/10/16 1700 09/10/16 1800 09/10/16 1830  BP: 140/76 126/75 125/77 126/78  Pulse: 85 86 88 81  Resp: 24 22 26 23   Temp:      TempSrc:      SpO2: 95% 92% 95% 96%  Weight:      Height:          Constitutional: NAD, calm, comfortable Eyes: PERTLA, lids and conjunctivae normal ENMT: Mucous membranes are moist. Posterior pharynx clear of any exudate or lesions.   Neck: normal, supple, no masses, no thyromegaly Respiratory: Fine crackles at left base and mid-lung, no wheezing. Normal respiratory effort.  Cardiovascular: S1 & S2 heard, regular rate and rhythm. No significant JVD. Abdomen: No distension, no tenderness, no masses palpated. Bowel sounds normal.  Musculoskeletal: no clubbing / cyanosis. Pitting edema to RLE. Normal muscle tone.  Skin: no significant rashes, lesions, ulcers. Warm, dry, well-perfused. Neurologic: CN 2-12 grossly intact. Sensation intact,  DTR normal. Strength 5/5 in all 4 limbs.  Psychiatric: Normal judgment and insight. Alert and oriented x 3. Normal mood and affect.     Labs on Admission: I have personally reviewed following labs and imaging studies  CBC:  Recent Labs Lab 09/10/16 1413  WBC 10.0  HGB 12.5*  HCT 37.1*  MCV 88.8  PLT [id number]   Basic Metabolic Panel:  Recent Labs Lab 09/10/16 1413  NA 132*  K 3.9  CL 97*  CO2 25  GLUCOSE 108*  BUN 10  CREATININE 0.80  CALCIUM 9.7   GFR: Estimated Creatinine Clearance: 118 mL/min (by C-G formula based on SCr of 0.8 mg/dL). Liver Function Tests: No results for input(s): AST, ALT, ALKPHOS, BILITOT, PROT, ALBUMIN in the last 168 hours. No results for input(s): LIPASE, AMYLASE in the last 168 hours. No results for input(s): AMMONIA in the last 168 hours. Coagulation Profile: No results for input(s): INR, PROTIME in the last 168 hours. Cardiac Enzymes: No results  for input(s): CKTOTAL, CKMB, CKMBINDEX, TROPONINI in the last 168 hours. BNP (last 3 results) No results for input(s): PROBNP in the last 8760 hours. HbA1C: No results for input(s): HGBA1C in the last 72 hours. CBG: No results for input(s): GLUCAP in the last 168 hours. Lipid Profile: No results for input(s): CHOL, HDL, LDLCALC, TRIG, CHOLHDL, LDLDIRECT in the last 72 hours. Thyroid Function Tests: No results for input(s): TSH, T4TOTAL, FREET4, T3FREE, THYROIDAB in the last 72 hours. Anemia Panel: No results for input(s): VITAMINB12, FOLATE, FERRITIN, TIBC, IRON, RETICCTPCT in the last 72 hours. Urine analysis: No results found for: COLORURINE, APPEARANCEUR, LABSPEC, PHURINE, GLUCOSEU, HGBUR, BILIRUBINUR, KETONESUR, PROTEINUR, UROBILINOGEN, NITRITE, LEUKOCYTESUR Sepsis Labs: @LABRCNTIP (procalcitonin:4,lacticidven:4) )No results found for this or any previous visit (from the past 240 hour(s)).   Radiological Exams on Admission: Dg Chest 2 View  Result Date: 09/10/2016 CLINICAL DATA:  Right foot pain and swelling EXAM: CHEST  2 VIEW COMPARISON:  None. FINDINGS: There is consolidation at the posterior left lung base. Subsegmental atelectasis at the right base. Small pleural effusions are suspected. Upper normal heart size. No pneumothorax. IMPRESSION: Left lower lobe pneumonia. Followup PA and lateral chest X-ray is recommended in 3-4 weeks following trial of antibiotic therapy to ensure resolution and exclude underlying malignancy. Electronically Signed   By: Marybelle Killings M.D.   On: 09/10/2016 15:25   Ct Angio Chest Pe W And/or Wo Contrast  Result Date: 09/10/2016 CLINICAL DATA:  Short of breath, concern for pulmonary embolism. The swelling. EXAM: CT ANGIOGRAPHY CHEST WITH CONTRAST TECHNIQUE: Multidetector CT imaging of the chest was performed using the standard protocol during bolus administration of intravenous contrast. Multiplanar CT image reconstructions and MIPs were obtained to  evaluate the vascular anatomy. CONTRAST:  80 mL Isovue COMPARISON:  Indications FINDINGS: Cardiovascular: Filling defect within the proximal LEFT lower lobe pulmonary artery which is elongated tubular and extends into the subsegmental branches consist with acute pulmonary embolism. Additional filling defect within the lingular pulmonary artery. Within the segmental branches of the RIGHT lower lobe there are additional filling defects. Filling defect within the RIGHT upper lobe pulmonary artery. Overall clot burden is moderate to severe. No evidence of RIGHT ventricular strain with the RIGHT ventricle to LEFT ventricle ratio less than 1. Mediastinum/Nodes: No axillary supraclavicular adenopathy. No mediastinal adenopathy. Lungs/Pleura: Peribronchial thickening in the LEFT lower lobe. No clear evidence of infarction. No pneumonia. Small LEFT effusion. Upper Abdomen: Limited view of the liver, kidneys, pancreas are unremarkable. Normal adrenal glands. Musculoskeletal: No  aggressive osseous lesion. Review of the MIP images confirms the above findings. IMPRESSION: 1. Multi lobar pulmonary emboli involving the LEFT lower lobe pulmonary arteries, RIGHT lower lobe pulmonary arteries, lingular pulmonary artery and RIGHT upper lobe pulmonary artery. 2. Overall clot burden is moderate to severe. 3. No evidence of RIGHT ventricular strain with the RIGHT ventricle ratio to LEFT ventricular ratio less than 1. 4. LEFT basilar atelectasis versus less likely infarction. Small LEFT effusion. Critical Value/emergent results were called by telephone at the time of interpretation on 09/10/2016 at 6:58 pm to Dr. Dorie Rank , who verbally acknowledged these results. Electronically Signed   By: Suzy Bouchard M.D.   On: 09/10/2016 18:59    EKG: Independently reviewed. Sinus rhythm, chronic RBBB, LAFB  Assessment/Plan  1. Acute pulmonary emboli, bilateral LE DVT's   - Developed RLE swelling and tenderness 3-4 wks ago, then acute SOB  with pleuritic pain  - CTA with bilateral PE's, no Rt heart strain  - Venous US with bilateral LE DVT  - There was no prolonged immobilization, injury, or personal hx of VTE - Reports history of DVT in his mother  - He has been started on heparin infusion with pharmacy's assistance  - There is some concern for occult malignancy as a precipitant for this; he is scheduled for screening colonoscopy in Feb (last one was 2005); reports having TURP performed in [place], [place] in 2010 and was told biopsies were negative   2. Hyponatremia  - Serum sodium 132 on admission  - Possibly secondary to HCTZ, will hold this  - Repeat chem panel as needed    3. Hypertension  - BP at goal on admission  - Hold HCTZ as above  - Continue ARB and Norvasc as tolerated    4. GERD - No EGD report on file  - Managed with daily PPI at home, will continue   5. Depression - Stable - Continue Lexapro     DVT prophylaxis: heparin infusion per VTE treatment dosing Code Status: Full  Family Communication: Discussed with patient Disposition Plan: Admit to telemetry Consults called: None Admission status: Inpatient    Vianne Bulls, MD [facility] Pager [phone]  If 7PM-7AM, please contact night-coverage [email or web address] Password [facility]  09/10/2016, 8:31 PM

## 2016-09-10 NOTE — ED Provider Notes (Signed)
[name] Provider Note   CSN: [id number] Arrival date & time: 09/10/16  1245     History   Chief Complaint Chief Complaint  Patient presents with  . Chest Pain  . Leg Pain  . Abdominal Pain    left side    HPI [name] is a 66 y.o. male.   Patient presents to the emergency room for evaluation of sharp left-sided chest pain associated with right leg swelling. Patient initially developed some pain in his right foot and calf a weeks ago. Symptoms were mild and he did not think too much of it initially.  Over the last few days he's also started developing sharp left-sided chest pain. It is severe on the left side of his chest and increases whenever he takes a deep breath. Patient has had some coughing. He has felt feverish. He has also had some generalized body aches as well. He went to his primary care doctor's office today and they were concerned about the possibility of a pulmonary embolism. He was sent to the emergency room for evaluation.  Past Medical History:  Diagnosis Date  . Depression   . GERD (gastroesophageal reflux disease)   . 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 Hyperlipidemia   . Hypertension     Patient Active Problem List   Diagnosis Date Noted  . Essential hypertension, benign 06/10/2015  . Depression 06/10/2015  . Hyperlipidemia LDL goal <130 06/10/2015  . GERD (gastroesophageal reflux disease) 06/10/2015    Past Surgical History:  Procedure Laterality Date  . adnoidectomy    . APPENDECTOMY    . PROSTATE SURGERY    . TONSILLECTOMY AND ADENOIDECTOMY         Home Medications    Prior to Admission medications   Medication Sig Start Date End Date Taking? Authorizing Provider  amLODipine (NORVASC) 5 MG tablet Take 1 tablet (5 mg total) by mouth daily. 11/12/15  Yes Fransisca Kaufmann Dettinger, MD  aspirin (ECOTRIN) 325 MG EC tablet Take 325 mg by mouth daily after supper.   Yes Historical Provider, MD  aspirin-acetaminophen-caffeine  (EXCEDRIN EXTRA STRENGTH) [phone] MG tablet Take 2 tablets by mouth daily.   Yes Historical Provider, MD  atorvastatin (LIPITOR) 80 MG tablet Take 1 tablet (80 mg total) by mouth daily. Patient taking differently: Take 80 mg by mouth at bedtime.  11/12/15  Yes Fransisca Kaufmann Dettinger, MD  Coenzyme Q10-Vitamin E (QUNOL ULTRA COQ10 PO) Take by mouth daily.   Yes Historical Provider, MD  Cyanocobalamin (VITAMIN B-12 PO) Take 1 tablet by mouth daily.   Yes Historical Provider, MD  escitalopram (LEXAPRO) 20 MG tablet Take 1 tablet (20 mg total) by mouth daily. 07/25/16  Yes Fransisca Kaufmann Dettinger, MD  Multiple Vitamin (MULTIVITAMIN WITH MINERALS) TABS tablet Take 1 tablet by mouth at bedtime.   Yes Historical Provider, MD  omeprazole (PRILOSEC) 20 MG capsule Take 1 capsule (20 mg total) by mouth daily. 11/12/15  Yes Fransisca Kaufmann Dettinger, MD  Polyvinyl Alcohol-Povidone (REFRESH OP) Place 1 drop into both eyes 2 (two) times daily as needed (dry eyes).   Yes Historical Provider, MD  Pseudoephedrine HCl (SUDAFED 12 HOUR PO) Take 1 tablet by mouth at bedtime as needed (sinus congestion).   Yes Historical Provider, MD  sildenafil (VIAGRA) 100 MG tablet Take 0.5-1 tablets (50-100 mg total) by mouth daily as needed for erectile dysfunction. Patient taking differently: Take 50 mg by mouth daily as needed for erectile dysfunction.  07/27/16  Yes Mary-Margaret Hassell Done, FNP  TURMERIC PO Take 3 capsules by mouth daily.   Yes Historical Provider, MD  valsartan-hydrochlorothiazide (DIOVAN-HCT) 320-12.5 MG tablet Take 1 tablet by mouth daily. 11/12/15  Yes Fransisca Kaufmann Dettinger, MD  diclofenac (VOLTAREN) 75 MG EC tablet Take 1 tablet (75 mg total) by mouth 2 (two) times daily as needed. Patient not taking: Reported on 09/10/2016 11/12/15   Fransisca Kaufmann Dettinger, MD    Family History Family History  Problem Relation Age of Onset  . COPD Mother   . Heart disease Mother   . AAA (abdominal aortic aneurysm) Mother   . Heart disease Father    . Depression Father   . Diabetes Father   . Heart disease Brother   . Early death Brother     heart attack  . Heart disease Maternal Grandmother   . Heart disease Maternal Grandfather   . Stroke Paternal Grandmother   . Heart disease Brother   . Diabetes Brother     Social History Social History  Substance Use Topics  . Smoking status: Never Smoker  . Smokeless tobacco: Never Used  . Alcohol use 0.0 oz/week     Comment: occasional     Allergies   Patient has no known allergies.   Review of Systems Review of Systems  All other systems reviewed and are negative.    Physical Exam Updated Vital Signs BP 126/78   Pulse 81   Temp 99.3 F (37.4 C) (Oral)   Resp 23   Ht 6\' 1"  (1.854 m)   Wt 112.9 kg   SpO2 96%   BMI 32.85 kg/m   Physical Exam  Constitutional: He appears well-developed and well-nourished.  HENT:  Head: Normocephalic and atraumatic.  Right Ear: External ear normal.  Left Ear: External ear normal.  Eyes: Conjunctivae are normal. Right eye exhibits no discharge. Left eye exhibits no discharge. No scleral icterus.  Neck: Neck supple. No tracheal deviation present.  Cardiovascular: Normal rate, regular rhythm and intact distal pulses.   Pulmonary/Chest: Effort normal and breath sounds normal. No stridor. No respiratory distress. He has no wheezes. He has no rales. He exhibits no tenderness.  Abdominal: Soft. Bowel sounds are normal. He exhibits no distension. There is no tenderness. There is no rebound and no guarding.  Musculoskeletal: He exhibits edema and tenderness.  Asymmetric edema of the right lower extremity and foot, no edema noted in the left lower extremity, tenderness palpation right calf  Neurological: He is alert. He has normal strength. No cranial nerve deficit (no facial droop, extraocular movements intact, no slurred speech) or sensory deficit. He exhibits normal muscle tone. He displays no seizure activity. Coordination normal.  Skin:  Skin is warm and dry. No rash noted. He is not diaphoretic.  Psychiatric: He has a normal mood and affect.  Nursing note and vitals reviewed.    ED Treatments / Results  Labs (all labs ordered are listed, but only abnormal results are displayed) Labs Reviewed  BASIC METABOLIC PANEL - Abnormal; Notable for the following:       Result Value   Sodium 132 (*)    Chloride 97 (*)    Glucose, Bld 108 (*)    All other components within normal limits  CBC - Abnormal; Notable for the following:    RBC 4.18 (*)    Hemoglobin 12.5 (*)    HCT 37.1 (*)    All other components within normal limits  HEPARIN LEVEL (UNFRACTIONATED)  Randolm Idol, ED  EKG  EKG Interpretation  Date/Time:  Saturday September 10 2016 14:05:16 EST Ventricular Rate:  92 PR Interval:  170 QRS Duration: 116 QT Interval:  368 QTC Calculation: 455 R Axis:   -45 Text Interpretation:  Normal sinus rhythm Right bundle branch block Left anterior fascicular block   Bifascicular block   Left ventricular hypertrophy with QRS widening Abnormal ECG No old tracing to compare Confirmed by Adin Laker  MD-J, Leelah Hanna [phone]) on 09/10/2016 4:42:08 PM       Radiology Dg Chest 2 View  Result Date: 09/10/2016 CLINICAL DATA:  Right foot pain and swelling EXAM: CHEST  2 VIEW COMPARISON:  None. FINDINGS: There is consolidation at the posterior left lung base. Subsegmental atelectasis at the right base. Small pleural effusions are suspected. Upper normal heart size. No pneumothorax. IMPRESSION: Left lower lobe pneumonia. Followup PA and lateral chest X-ray is recommended in 3-4 weeks following trial of antibiotic therapy to ensure resolution and exclude underlying malignancy. Electronically Signed   By: Marybelle Killings M.D.   On: 09/10/2016 15:25   Ct Angio Chest Pe W And/or Wo Contrast  Result Date: 09/10/2016 CLINICAL DATA:  Short of breath, concern for pulmonary embolism. The swelling. EXAM: CT ANGIOGRAPHY CHEST WITH CONTRAST TECHNIQUE:  Multidetector CT imaging of the chest was performed using the standard protocol during bolus administration of intravenous contrast. Multiplanar CT image reconstructions and MIPs were obtained to evaluate the vascular anatomy. CONTRAST:  80 mL Isovue COMPARISON:  Indications FINDINGS: Cardiovascular: Filling defect within the proximal LEFT lower lobe pulmonary artery which is elongated tubular and extends into the subsegmental branches consist with acute pulmonary embolism. Additional filling defect within the lingular pulmonary artery. Within the segmental branches of the RIGHT lower lobe there are additional filling defects. Filling defect within the RIGHT upper lobe pulmonary artery. Overall clot burden is moderate to severe. No evidence of RIGHT ventricular strain with the RIGHT ventricle to LEFT ventricle ratio less than 1. Mediastinum/Nodes: No axillary supraclavicular adenopathy. No mediastinal adenopathy. Lungs/Pleura: Peribronchial thickening in the LEFT lower lobe. No clear evidence of infarction. No pneumonia. Small LEFT effusion. Upper Abdomen: Limited view of the liver, kidneys, pancreas are unremarkable. Normal adrenal glands. Musculoskeletal: No aggressive osseous lesion. Review of the MIP images confirms the above findings. IMPRESSION: 1. Multi lobar pulmonary emboli involving the LEFT lower lobe pulmonary arteries, RIGHT lower lobe pulmonary arteries, lingular pulmonary artery and RIGHT upper lobe pulmonary artery. 2. Overall clot burden is moderate to severe. 3. No evidence of RIGHT ventricular strain with the RIGHT ventricle ratio to LEFT ventricular ratio less than 1. 4. LEFT basilar atelectasis versus less likely infarction. Small LEFT effusion. Critical Value/emergent results were called by telephone at the time of interpretation on 09/10/2016 at 6:58 pm to Dr. Dorie Rank , who verbally acknowledged these results. Electronically Signed   By: Suzy Bouchard M.D.   On: 09/10/2016 18:59    Preliminary results by tech - Venous Duplex Lower Ext. Completed. Right leg, positive for acute deep vein thrombosis involving the mid and distal femoral vein, popliteal vein and the posterior tibial and peroneal veins. Left leg, positive for acute deep vein thrombosis involving the posterior tibial and peroneal veins. Results given to patient's nurse, Burna Mortimer. Rita Sturdivant, BS, RDMS, RVT  Procedures .Critical Care Performed by: Dorie Rank Authorized by: Dorie Rank   Critical care provider statement:    Critical care time (minutes):  30   Critical care was time spent personally by me on the following activities:  Discussions with consultants, evaluation of patient's response to treatment, examination of patient, ordering and performing treatments and interventions, ordering and review of laboratory studies, ordering and review of radiographic studies, pulse oximetry, re-evaluation of patient's condition, obtaining history from patient or surrogate and review of old charts   (including critical care time)  Medications Ordered in ED Medications  heparin ADULT infusion 100 units/mL (25000 units/268mL sodium chloride 0.45%) (1,450 Units/hr Intravenous New Bag/Given 09/10/16 1805)  HYDROmorphone (DILAUDID) injection 1 mg (1 mg Intravenous Given 09/10/16 1659)  iopamidol (ISOVUE-370) 76 % injection (100 mLs  Contrast Given 09/10/16 1732)  heparin bolus via infusion 4,000 Units (4,000 Units Intravenous Bolus from Bag 09/10/16 1806)     Initial Impression / Assessment and Plan / ED Course  I have reviewed the triage vital signs and the nursing notes.  Pertinent labs & imaging results that were available during my care of the patient were reviewed by me and considered in my medical decision making (see chart for details).  Clinical Course as of Sep 10 1908  Sat Sep 10, 2016  1656 I concur with the concerns of his primary care doctor regarding possible pulmonary embolism. X-ray suggests  pneumonia but clinically his symptoms are more suggestive of possible pulmonary embolism.Ordered a CT angiogram of the chest as well as a Doppler study of his right lower extremity.  I will start him on heparin empirically while waiting for his diagnostic tests.  [JK]    Clinical Course User Index [JK] Dorie Rank, MD    Patient presented to the emergency room with complaints of chest pain and leg swelling. His symptoms were concerning for the possibility of pulmonary embolism. His Doppler study of his leg confirms a DVT and the CT scan of his chest confirms pulmonary embolism. Patient was empirically started on heparin based on his initial presentation. I will consult the medical service for admission and continued treatment.  Final Clinical Impressions(s) / ED Diagnoses   Final diagnoses:  Swelling  Other acute pulmonary embolism without acute cor pulmonale (HCC)      Dorie Rank, MD 09/10/16 [id number]

## 2016-09-10 NOTE — ED Triage Notes (Signed)
Pt. Stated, I've had chest pain and left side pain with right leg swollen for 3 weeks. Went to [facility] clinic and told to come here to be reevaluated.

## 2016-09-10 NOTE — Progress Notes (Signed)
BP 132/85   Pulse 90   Temp (!) 100.9 F (38.3 C) (Oral)   Ht _0  (1.854 m)   Wt 249 lb (112.9 kg)   BMI 32.85 kg/m    Subjective:    Patient ID: [name], male    DOB: [birth year], 66 y.o.   MRN: [id number]  HPI: [name] is a 66 y.o. male presenting on 09/10/2016 for Shortness of Breath (pt here today c/o having pains when he takes a deep breath); Fever; and Generalized Body Aches  The patient did have a flu shot. And the flu screen is negative today. During his interview behavior explained that he had some pain all week or so ago that was in his right foot and lower leg. It moved up into his calf and behind his leg. He felt very weak in his right leg but not to the point of needing to come to the doctor. He now has pleuritic pain upon breathing in. The pain is mainly center and to the lower left lobe. I'm greatly concerned about a pulmonary embolus and DVT. We are can have the patient go directly to the emergency room for workup and care.  Relevant past medical, surgical, family and social history reviewed and updated as indicated. Allergies and medications reviewed and updated.  Past Medical History:  Diagnosis Date  . Depression   . GERD (gastroesophageal reflux disease)   . 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 Hyperlipidemia   . Hypertension     Past Surgical History:  Procedure Laterality Date  . adnoidectomy    . APPENDECTOMY    . PROSTATE SURGERY    . TONSILLECTOMY AND ADENOIDECTOMY      Review of Systems  Constitutional: Positive for activity change, fatigue and fever. Negative for appetite change.  Eyes: Negative for pain and visual disturbance.  Respiratory: Positive for shortness of breath. Negative for cough, chest tightness and wheezing.   Cardiovascular: Positive for chest pain and leg swelling. Negative for palpitations.  Gastrointestinal: Negative.  Negative for abdominal pain, diarrhea, nausea and vomiting.  Genitourinary: Negative.     Skin: Negative.  Negative for color change and rash.  Neurological: Negative.  Negative for weakness, numbness and headaches.  Psychiatric/Behavioral: Negative.     Allergies as of 09/10/2016   No Known Allergies     Medication List       Accurate as of 09/10/16 11:25 AM. Always use your most recent med list.          amLODipine 5 MG tablet Commonly known as:  NORVASC Take 1 tablet (5 mg total) by mouth daily.   atorvastatin 80 MG tablet Commonly known as:  LIPITOR Take 1 tablet (80 mg total) by mouth daily.   diclofenac 75 MG EC tablet Commonly known as:  VOLTAREN Take 1 tablet (75 mg total) by mouth 2 (two) times daily as needed.   escitalopram 20 MG tablet Commonly known as:  LEXAPRO Take 1 tablet (20 mg total) by mouth daily.   omeprazole 20 MG capsule Commonly known as:  PRILOSEC Take 1 capsule (20 mg total) by mouth daily.   sildenafil 100 MG tablet Commonly known as:  VIAGRA Take 0.5-1 tablets (50-100 mg total) by mouth daily as needed for erectile dysfunction.   valsartan-hydrochlorothiazide 320-12.5 MG tablet Commonly known as:  DIOVAN-HCT Take 1 tablet by mouth daily.          Objective:    BP 132/85   Pulse  90   Temp (!) 100.9 F (38.3 C) (Oral)   Ht _0  (1.854 m)   Wt 249 lb (112.9 kg)   BMI 32.85 kg/m   No Known Allergies  Physical Exam  Constitutional: He appears well-developed and well-nourished. No distress.  HENT:  Head: Normocephalic and atraumatic.  Eyes: Conjunctivae and EOM are normal. Pupils are equal, round, and reactive to light.  Cardiovascular: Normal rate, regular rhythm, S1 normal, S2 normal and normal heart sounds.  Exam reveals no gallop.   No murmur heard. 2 + pitting pretibial right leg, left with trace. Positive Homan's right leg  Pulmonary/Chest: Effort normal and breath sounds normal. No respiratory distress.  Skin: Skin is warm, dry and intact. There is erythema.     Psychiatric: He has a normal mood and  affect. His behavior is normal.  Nursing note and vitals reviewed.   Results for orders placed or performed in visit on 07/25/16  CMP14+EGFR  Result Value Ref Range   Glucose 102 (H) 65 - 99 mg/dL   BUN 11 8 - 27 mg/dL   Creatinine, Ser 0.76 0.76 - 1.27 mg/dL   GFR calc non Af Amer 94 >59 mL/min/1.73   GFR calc Af Amer 109 >59 mL/min/1.73   BUN/Creatinine Ratio 14 10 - 24   Sodium 139 134 - 144 mmol/L   Potassium 4.0 3.5 - 5.2 mmol/L   Chloride 98 96 - 106 mmol/L   CO2 27 18 - 29 mmol/L   Calcium 9.7 8.6 - 10.2 mg/dL   Total Protein 6.9 6.0 - 8.5 g/dL   Albumin 4.3 3.6 - 4.8 g/dL   Globulin, Total 2.6 1.5 - 4.5 g/dL   Albumin/Globulin Ratio 1.7 1.2 - 2.2   Bilirubin Total 0.6 0.0 - 1.2 mg/dL   Alkaline Phosphatase 119 (H) 39 - 117 IU/L   AST 21 0 - 40 IU/L   ALT 21 0 - 44 IU/L  Lipid panel  Result Value Ref Range   Cholesterol, Total 134 100 - 199 mg/dL   Triglycerides 98 0 - 149 mg/dL   HDL 41 >39 mg/dL   VLDL Cholesterol Cal 20 5 - 40 mg/dL   LDL Calculated 73 0 - 99 mg/dL   Chol/HDL Ratio 3.3 0.0 - 5.0 ratio units  Hepatitis C antibody  Result Value Ref Range   Hep C Virus Ab <0.1 0.0 - 0.9 s/co ratio  Bayer DCA Hb A1c Waived  Result Value Ref Range   Bayer DCA Hb A1c Waived 5.7 <7.0 %      Assessment & Plan:   1. Body aches FLU screen neg - Veritor Flu A/B Waived  2. Leg swelling ED now  3. Chest pain on breathing ED now   Continue all other maintenance medications as listed above.  Follow up plan: No Follow-up on file.  Orders Placed This Encounter  Procedures  . Veritor Flu A/B [employer] given for meds  Terald Sleeper PA-C [facility] [address]  [place], [place] [address] [phone]   09/10/2016, 11:25 AM

## 2016-09-11 ENCOUNTER — Telehealth: Payer: Self-pay | Admitting: Physician Assistant

## 2016-09-11 LAB — RESPIRATORY PANEL BY PCR

## 2016-09-11 LAB — CBC
HCT: 33.2 % — ABNORMAL LOW (ref 39.0–52.0)
HEMOGLOBIN: 11.1 g/dL — AB (ref 13.0–17.0)
MCH: 29.8 pg (ref 26.0–34.0)
MCHC: 33.4 g/dL (ref 30.0–36.0)
MCV: 89 fL (ref 78.0–100.0)
PLATELETS: 173 10*3/uL (ref 150–400)
RBC: 3.73 MIL/uL — ABNORMAL LOW (ref 4.22–5.81)
RDW: 12.8 % (ref 11.5–15.5)
WBC: 8.6 10*3/uL (ref 4.0–10.5)

## 2016-09-11 LAB — BASIC METABOLIC PANEL
Anion gap: 10 (ref 5–15)
BUN: 11 mg/dL (ref 6–20)
CO2: 28 mmol/L (ref 22–32)
Calcium: 9.1 mg/dL (ref 8.9–10.3)
Chloride: 96 mmol/L — ABNORMAL LOW (ref 101–111)
Creatinine, Ser: 0.85 mg/dL (ref 0.61–1.24)
GFR calc Af Amer: >60 mL/min (ref >60)
GFR calc non Af Amer: >60 mL/min (ref >60)
Glucose, Bld: 115 mg/dL — ABNORMAL HIGH (ref 65–99)
Potassium: 4 mmol/L (ref 3.5–5.1)
Sodium: 134 mmol/L — ABNORMAL LOW (ref 135–145)

## 2016-09-11 LAB — HEPARIN LEVEL (UNFRACTIONATED)
HEPARIN UNFRACTIONATED: 0.31 [IU]/mL (ref 0.30–0.70)
Heparin Unfractionated: 0.3 IU/mL (ref 0.30–0.70)
Heparin Unfractionated: 0.37 IU/mL (ref 0.30–0.70)

## 2016-09-11 LAB — MRSA PCR SCREENING: MRSA BY PCR: NEGATIVE

## 2016-09-11 LAB — GLUCOSE, CAPILLARY: Glucose-Capillary: 115 mg/dL — ABNORMAL HIGH (ref 65–99)

## 2016-09-11 NOTE — Progress Notes (Signed)
ANTICOAGULATION CONSULT NOTE - Follow Up Consult  Pharmacy Consult for Heparin  Indication: pulmonary embolus and DVT  No Known Allergies  Patient Measurements: Height: 6\' 1"  (185.4 cm) Weight: 245 lb 11.2 oz (111.4 kg) IBW/kg (Calculated) : 79.9  HDW: 103.8 kg  Vital Signs: Temp: 98.9 F (37.2 C) (01/21 0757) Temp Source: Oral (01/21 0757) BP: 108/89 (01/21 0800) Pulse Rate: 78 (01/21 0800)  Labs:  Recent Labs  09/10/16 1413 09/10/16 2344 09/11/16 0852  HGB 12.5*  --  11.1*  HCT 37.1*  --  33.2*  PLT 182  --  173  HEPARINUNFRC  --  0.31 0.30  CREATININE 0.80 0.85  --     Estimated Creatinine Clearance: 110.3 mL/min (by C-G formula based on SCr of 0.85 mg/dL).   Assessment: Heparin drip for new onset DVT/PE. CT shows multi lobar PE with moderate to severe clot burden.  Heparin level borderline therapeutic on increased rate. Will increase further. Aim for middle of range given indication and clot burden. CBC stable. No bleeding noted.   Goal of Therapy:  Heparin level 0.3-0.7 units/ml Monitor platelets by anticoagulation protocol: Yes   Plan:  -Inc heparin drip to 1800 units/hr -1700 HL -Daily heparin level/cbc -monitor s/sx of bleeding  Dierdre Harness, Cain Sieve, PharmD Clinical Pharmacy Resident [phone] (Pager) 09/11/2016 9:40 AM

## 2016-09-11 NOTE — Progress Notes (Signed)
ANTICOAGULATION CONSULT NOTE - Follow Up Consult  Pharmacy Consult for Heparin  Indication: pulmonary embolus and DVT  No Known Allergies  Patient Measurements: Height: 6\' 1"  (185.4 cm) Weight: 245 lb 11.2 oz (111.4 kg) IBW/kg (Calculated) : 79.9  HDW: 103.8 kg  Vital Signs: Temp: 98.1 F (36.7 C) (01/21 1629) Temp Source: Oral (01/21 1629) BP: 120/64 (01/21 1629) Pulse Rate: 72 (01/21 1629)  Labs:  Recent Labs  09/10/16 1413 09/10/16 2344 09/11/16 0852 09/11/16 1743  HGB 12.5*  --  11.1*  --   HCT 37.1*  --  33.2*  --   PLT 182  --  173  --   HEPARINUNFRC  --  0.31 0.30 0.37  CREATININE 0.80 0.85  --   --     Estimated Creatinine Clearance: 110.3 mL/min (by C-G formula based on SCr of 0.85 mg/dL).   Assessment: Heparin drip for new onset DVT/PE. CT shows multi lobar PE with moderate to severe clot burden.  Confirmatory heparin level remains therapeutic this evening.  No issues with infusion or sxs of bleeding.   Goal of Therapy:  Heparin level 0.3-0.7 units/ml Monitor platelets by anticoagulation protocol: Yes   Plan:  - Continue heparin drip at 1800 units/hr - Heparin level in am  - Daily heparin level/cbc - monitor s/sx of bleeding  Vincenza Hews, PharmD, BCPS 09/11/2016, 6:46 PM

## 2016-09-11 NOTE — Progress Notes (Signed)
ANTICOAGULATION CONSULT NOTE - Follow Up Consult  Pharmacy Consult for Heparin  Indication: pulmonary embolus and DVT  No Known Allergies  Patient Measurements: Height: 6\' 1"  (185.4 cm) Weight: 245 lb 11.2 oz (111.4 kg) IBW/kg (Calculated) : 79.9  Vital Signs: Temp: 98.8 F (37.1 C) (01/21 0018) Temp Source: Oral (01/21 0018) BP: 129/77 (01/21 0018) Pulse Rate: 76 (01/21 0018)  Labs:  Recent Labs  09/10/16 1413 09/10/16 2344  HGB 12.5*  --   HCT 37.1*  --   PLT 182  --   HEPARINUNFRC  --  0.31  CREATININE 0.80  --     Estimated Creatinine Clearance: 117.2 mL/min (by C-G formula based on SCr of 0.8 mg/dL).   Assessment: Heparin drip for new onset DVT/PE. Heparin level on low end of therapeutic range, will aim for middle of range given indication.   Goal of Therapy:  Heparin level 0.3-0.7 units/ml Monitor platelets by anticoagulation protocol: Yes   Plan:  -Inc heparin drip to 1600 units/hr to prevent sub-therapeutic level in setting of new DVT/PE -0900 HL  Narda Bonds 09/11/2016,12:24 AM

## 2016-09-11 NOTE — Progress Notes (Signed)
Patient Demographics:    [name], is a 66 y.o. male, DOB - 05-07-1951, [id number]  Admit date - 09/10/2016   Admitting Physician Vianne Bulls, MD  Outpatient Primary MD for the patient is Worthy Rancher, MD  LOS - 1   Chief Complaint  Patient presents with  . Chest Pain  . Leg Pain  . Abdominal Pain    left side        Subjective:    [name] today has no fevers, no emesis,  Patient's wife at bedside, questions answered, patient continues to have shortness of breath chest pain and pleuritic symptoms   Assessment  & Plan :    Principal Problem:   Pulmonary emboli (HCC) Active Problems:   Essential hypertension, benign   Depression   GERD (gastroesophageal reflux disease)   Acute DVT (deep venous thrombosis) (HCC)   Hyponatremia   Normocytic anemia   Acute pulmonary embolism (HCC)   Interval history:- HPI: [name] is a 66 y.o. male with medical history significant for hypertension, GERD, and depression, admitted on 09/10/2016 with bilateral lower extremity DVT and bilateral PE without right heart strain.  On the day of admission patient presented to the ED from his PCP office for evaluation of shortness of breath, pleuritic pain, and right leg swelling and tenderness. No definite ":provoking" factors have been identified for his PE. He does have a family history of DVT and his mother.  He had a colonoscopy performed in 2005 and is scheduled for a repeat routine screening colonoscopy in February 2018. He reports frequent urination chronically which had been attributed to his prostate and he underwent a TURP approximately 8 years ago in [place], [place] with biopsies reportedly negative for malignancy at that time.  Plan:- 1. Acute Pulmonary Emboli, bilateral LE DVT's  - Please see  studies ordered by PCP, continue iv heparin, possible discharge home on Eliquis or Xarelto,  patient will most likely need lifelong anticoagulation if we are not able to identify a provoking event with reversibility. Patient would need Malignancy workup, may not need hypercoagulability workup was probably needs lifelong anticoagulation anyway. Echo pending   2)Hyponatremia - HCTZ on hold, Replace and recheck  3) Hypertension - stable, Amlodipine 5mg  daily and irbesatan 300 mg daily, HCTZ on hold due to concerns about hyponatremia   4)GERD- continue PPI especially in view of anticoagulation    5)Depression- stable, continue Lexapro     DVT prophylaxis: heparin infusion per VTE treatment dosing Code Status: Full  Family Communication: Discussed with patient Disposition Plan: Admit to telemetry Consults called: None Admission status: Inpatient     Lab Results  Component Value Date   PLT 173 09/11/2016    Inpatient Medications  Scheduled Meds: . amLODipine  5 mg Oral Daily  . atorvastatin  80 mg Oral QHS  . escitalopram  20 mg Oral Daily  . famotidine (PEPCID) IV  20 mg Intravenous Q12H  . irbesartan  300 mg Oral Daily  . ketorolac  15 mg Intravenous Q6H  . multivitamin with minerals  1 tablet Oral QHS  . pantoprazole  40 mg Oral Daily  . sodium chloride flush  3 mL Intravenous Q12H   Continuous Infusions: . heparin 1,800 Units/hr (09/11/16 0943)  PRN Meds:.acetaminophen **OR** acetaminophen, bisacodyl, HYDROcodone-acetaminophen, HYDROmorphone (DILAUDID) injection, ondansetron **OR** ondansetron (ZOFRAN) IV, polyethylene glycol, polyvinyl alcohol, zolpidem    Anti-infectives    None        Objective:   Vitals:   09/11/16 0410 09/11/16 0757 09/11/16 0800 09/11/16 1208  BP: 130/65 108/89 108/89 90/60  Pulse: 72 80 78 77  Resp: (!) 23 (!) 22 (!) 23 20  Temp: 98.8 F (37.1 C) 98.9 F (37.2 C)  99.3 F (37.4 C)  TempSrc: Oral Oral  Oral  SpO2: 95% 92% 90% 95%  Weight:      Height:        Wt Readings from Last 3 Encounters:  09/11/16  111.4 kg (245 lb 11.2 oz)  09/10/16 112.9 kg (249 lb)  07/25/16 113.6 kg (250 lb 6 oz)     Intake/Output Summary (Last 24 hours) at 09/11/16 1451 Last data filed at 09/11/16 1230  Gross per 24 hour  Intake          1661.82 ml  Output             1400 ml  Net           261.82 ml     Physical Exam  Gen:- Awake Alert,  In no apparent distress , wife at bedside questions answered HEENT:- [place].AT, No sclera icterus Neck-Supple Neck,No JVD,.  Lungs-  CTAB  CV- S1, S2 normal Abd-  +ve B.Sounds, Abd Soft, No tenderness,    Extremity/Skin:- warm/dry    Data Review:   Micro Results Recent Results (from the past 240 hour(s))  MRSA PCR Screening     Status: None   Collection Time: 09/10/16 10:43 PM  Result Value Ref Range Status   MRSA by PCR NEGATIVE NEGATIVE Final    Comment:        The GeneXpert MRSA Assay (FDA approved for NASAL specimens only), is one component of a comprehensive MRSA colonization surveillance program. It is not intended to diagnose MRSA infection nor to guide or monitor treatment for MRSA infections.   Respiratory Panel by PCR     Status: None   Collection Time: 09/11/16  6:10 AM  Result Value Ref Range Status   Adenovirus NOT DETECTED NOT DETECTED Final   Coronavirus 229E NOT DETECTED NOT DETECTED Final   Coronavirus HKU1 NOT DETECTED NOT DETECTED Final   Coronavirus NL63 NOT DETECTED NOT DETECTED Final   Coronavirus OC43 NOT DETECTED NOT DETECTED Final   Metapneumovirus NOT DETECTED NOT DETECTED Final   Rhinovirus / Enterovirus NOT DETECTED NOT DETECTED Final   Influenza A NOT DETECTED NOT DETECTED Final   Influenza B NOT DETECTED NOT DETECTED Final   Parainfluenza Virus 1 NOT DETECTED NOT DETECTED Final   Parainfluenza Virus 2 NOT DETECTED NOT DETECTED Final   Parainfluenza Virus 3 NOT DETECTED NOT DETECTED Final   Parainfluenza Virus 4 NOT DETECTED NOT DETECTED Final   Respiratory Syncytial Virus NOT DETECTED NOT DETECTED Final   Bordetella  pertussis NOT DETECTED NOT DETECTED Final   Chlamydophila pneumoniae NOT DETECTED NOT DETECTED Final   Mycoplasma pneumoniae NOT DETECTED NOT DETECTED Final    Radiology Reports Dg Chest 2 View  Result Date: 09/10/2016 CLINICAL DATA:  Right foot pain and swelling EXAM: CHEST  2 VIEW COMPARISON:  None. FINDINGS: There is consolidation at the posterior left lung base. Subsegmental atelectasis at the right base. Small pleural effusions are suspected. Upper normal heart size. No pneumothorax. IMPRESSION: Left lower lobe pneumonia. Followup PA and  lateral chest X-ray is recommended in 3-4 weeks following trial of antibiotic therapy to ensure resolution and exclude underlying malignancy. Electronically Signed   By: Marybelle Killings M.D.   On: 09/10/2016 15:25   Ct Angio Chest Pe W And/or Wo Contrast  Result Date: 09/10/2016 CLINICAL DATA:  Short of breath, concern for pulmonary embolism. The swelling. EXAM: CT ANGIOGRAPHY CHEST WITH CONTRAST TECHNIQUE: Multidetector CT imaging of the chest was performed using the standard protocol during bolus administration of intravenous contrast. Multiplanar CT image reconstructions and MIPs were obtained to evaluate the vascular anatomy. CONTRAST:  80 mL Isovue COMPARISON:  Indications FINDINGS: Cardiovascular: Filling defect within the proximal LEFT lower lobe pulmonary artery which is elongated tubular and extends into the subsegmental branches consist with acute pulmonary embolism. Additional filling defect within the lingular pulmonary artery. Within the segmental branches of the RIGHT lower lobe there are additional filling defects. Filling defect within the RIGHT upper lobe pulmonary artery. Overall clot burden is moderate to severe. No evidence of RIGHT ventricular strain with the RIGHT ventricle to LEFT ventricle ratio less than 1. Mediastinum/Nodes: No axillary supraclavicular adenopathy. No mediastinal adenopathy. Lungs/Pleura: Peribronchial thickening in the LEFT  lower lobe. No clear evidence of infarction. No pneumonia. Small LEFT effusion. Upper Abdomen: Limited view of the liver, kidneys, pancreas are unremarkable. Normal adrenal glands. Musculoskeletal: No aggressive osseous lesion. Review of the MIP images confirms the above findings. IMPRESSION: 1. Multi lobar pulmonary emboli involving the LEFT lower lobe pulmonary arteries, RIGHT lower lobe pulmonary arteries, lingular pulmonary artery and RIGHT upper lobe pulmonary artery. 2. Overall clot burden is moderate to severe. 3. No evidence of RIGHT ventricular strain with the RIGHT ventricle ratio to LEFT ventricular ratio less than 1. 4. LEFT basilar atelectasis versus less likely infarction. Small LEFT effusion. Critical Value/emergent results were called by telephone at the time of interpretation on 09/10/2016 at 6:58 pm to Dr. Dorie Rank , who verbally acknowledged these results. Electronically Signed   By: Suzy Bouchard M.D.   On: 09/10/2016 18:59     CBC  Recent Labs Lab 09/10/16 1413 09/11/16 0852  WBC 10.0 8.6  HGB 12.5* 11.1*  HCT 37.1* 33.2*  PLT 182 173  MCV 88.8 89.0  MCH 29.9 29.8  MCHC 33.7 33.4  RDW 13.1 12.8    Chemistries   Recent Labs Lab 09/10/16 1413 09/10/16 2344  NA 132* 134*  K 3.9 4.0  CL 97* 96*  CO2 25 28  GLUCOSE 108* 115*  BUN 10 11  CREATININE 0.80 0.85  CALCIUM 9.7 9.1   ------------------------------------------------------------------------------------------------------------------ No results for input(s): CHOL, HDL, LDLCALC, TRIG, CHOLHDL, LDLDIRECT in the last 72 hours.  No results found for: HGBA1C ------------------------------------------------------------------------------------------------------------------ No results for input(s): TSH, T4TOTAL, T3FREE, THYROIDAB in the last 72 hours.  Invalid input(s): FREET3 ------------------------------------------------------------------------------------------------------------------ No results for  input(s): VITAMINB12, FOLATE, FERRITIN, TIBC, IRON, RETICCTPCT in the last 72 hours.  Coagulation profile No results for input(s): INR, PROTIME in the last 168 hours.  No results for input(s): DDIMER in the last 72 hours.  Cardiac Enzymes No results for input(s): CKMB, TROPONINI, MYOGLOBIN in the last 168 hours.  Invalid input(s): CK ------------------------------------------------------------------------------------------------------------------ No results found for: BNP   Parvin Stetzer M.D on 09/11/2016 at 2:51 PM  Between 7am to 7pm - Pager - [phone]  After 7pm go to [email or web address] - password [facility]  [facility] -  Office  [phone]  Dragon dictation system was used to create this note, attempts have been made to correct  errors, however presence of uncorrected errors is not a reflection quality of care provided

## 2016-09-11 NOTE — Progress Notes (Signed)
Pt RSV panel negative. Will remove droplet precautions

## 2016-09-12 ENCOUNTER — Inpatient Hospital Stay (HOSPITAL_COMMUNITY): Payer: Medicare Other

## 2016-09-12 ENCOUNTER — Telehealth: Payer: Self-pay | Admitting: Family Medicine

## 2016-09-12 DIAGNOSIS — I2699 Other pulmonary embolism without acute cor pulmonale: Secondary | ICD-10-CM

## 2016-09-12 DIAGNOSIS — I2692 Saddle embolus of pulmonary artery without acute cor pulmonale: Secondary | ICD-10-CM

## 2016-09-12 LAB — ECHOCARDIOGRAM COMPLETE
Height: 73 in
WEIGHTICAEL: 3990.4 [oz_av]

## 2016-09-12 LAB — CBC
HCT: 34.5 % — ABNORMAL LOW (ref 39.0–52.0)
Hemoglobin: 11.5 g/dL — ABNORMAL LOW (ref 13.0–17.0)
MCH: 29.8 pg (ref 26.0–34.0)
MCHC: 33.3 g/dL (ref 30.0–36.0)
MCV: 89.4 fL (ref 78.0–100.0)
PLATELETS: 192 10*3/uL (ref 150–400)
RBC: 3.86 MIL/uL — ABNORMAL LOW (ref 4.22–5.81)
RDW: 13 % (ref 11.5–15.5)
WBC: 8.7 10*3/uL (ref 4.0–10.5)

## 2016-09-12 LAB — VERITOR FLU A/B WAIVED
Influenza A: NEGATIVE
Influenza B: NEGATIVE

## 2016-09-12 LAB — HEPARIN LEVEL (UNFRACTIONATED)
Heparin Unfractionated: 0.21 IU/mL — ABNORMAL LOW (ref 0.30–0.70)
Heparin Unfractionated: 0.32 IU/mL (ref 0.30–0.70)

## 2016-09-12 MED ORDER — POLYETHYLENE GLYCOL 3350 17 G PO PACK
17.0000 g | PACK | Freq: Every day | ORAL | Status: DC
  Administered 2016-09-12 – 2016-09-13 (×2): 17 g via ORAL
  Filled 2016-09-12 (×3): qty 1

## 2016-09-12 NOTE — Progress Notes (Signed)
ANTICOAGULATION CONSULT NOTE - Follow Up Consult  Pharmacy Consult for Heparin  Indication: pulmonary embolus and DVT  No Known Allergies  Patient Measurements: Height: 6\' 1"  (185.4 cm) Weight: 249 lb 6.4 oz (113.1 kg) IBW/kg (Calculated) : 79.9  HDW: 103.8 kg  Vital Signs: Temp: 97.8 F (36.6 C) (01/22 0825) Temp Source: Oral (01/22 0825) BP: 117/82 (01/22 0825) Pulse Rate: 72 (01/22 0825)  Labs:  Recent Labs  09/10/16 1413  09/10/16 2344 09/11/16 0852 09/11/16 1743 09/12/16 0410  HGB 12.5*  --   --  11.1*  --  11.5*  HCT 37.1*  --   --  33.2*  --  34.5*  PLT 182  --   --  173  --  192  HEPARINUNFRC  --   < > 0.31 0.30 0.37 0.21*  CREATININE 0.80  --  0.85  --   --   --   < > = values in this interval not displayed.  Estimated Creatinine Clearance: 111.2 mL/min (by C-G formula based on SCr of 0.85 mg/dL).   Assessment: Heparin drip for new onset DVT/PE. CT shows multi lobar PE with moderate to severe clot burden.    Heparin level low this AM at 0.21, CBC stable  Goal of Therapy:  Heparin level 0.3-0.7 units/ml Monitor platelets by anticoagulation protocol: Yes   Plan:  Increase heparin to 2000 units / hr Heparin level 6 hours after heparin increased  Transition to Xarelto or Eliquis??  Thank you Anette Guarneri, PharmD [phone] 09/12/2016, 10:34 AM

## 2016-09-12 NOTE — Progress Notes (Signed)
Patient Demographics:    [name], is a 66 y.o. male, DOB - 24-Feb-1951, [id number]  Admit date - 09/10/2016   Admitting Physician Donald Bulls, MD  Outpatient Primary MD for the patient is Donald Rancher, MD  LOS - 2   Chief Complaint  Patient presents with  . Chest Pain  . Leg Pain  . Abdominal Pain    left side        Subjective:    [name] today has no fevers, no emesis,  No chest pain,  Shortness of breath is better, able to speak in sentences   Assessment  & Plan :    Principal Problem:   Pulmonary emboli (HCC) Active Problems:   Essential hypertension, benign   Depression   GERD (gastroesophageal reflux disease)   Acute DVT (deep venous thrombosis) (HCC)   Hyponatremia   Normocytic anemia   Acute pulmonary embolism (HCC)  Interval history:- HPI: [name] a 66 y.o.malewith medical history significant forhypertension, GERD, and depression, admitted on 09/10/2016 with bilateral lower extremity DVT and bilateral PE without right heart strain.  On the day of admission patient presented to the ED from his PCP office for evaluation of shortness of breath, pleuritic pain, and right leg swelling and tenderness. No definite ":provoking" factors have been identified for his PE. He does have a family history of DVT and his mother.  He had a colonoscopy performed in 2005 and is scheduled for a repeat routine screening colonoscopy in February 2018. 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   Plan:- 1. Acute Pulmonary Emboli, bilateral LE DVT's -  Hemodynamically stable, cardiopulmonary status appears to have improved, continue iv heparin until ready for discharge at which time Eliquis or Xarelto may be  started, possible discharge home on Eliquis or Xarelto in 1-2 days if echocardiogram is unremarkable if patient continues to improve.  patient will most likely need lifelong anticoagulation if we are not able to identify a provoking event with reversibility. Patient would need Malignancy workup, may not need hypercoagulability workup was probably needs lifelong anticoagulation anyway. Echocardiogram pending.    2)Hyponatremia - HCTZ on hold, sodium is up to 134, Replace and recheck  3) Hypertension - stable, continue Amlodipine 5mg  daily and irbesatan 300 mg daily, HCTZ on hold due to concerns about hyponatremia   4)GERD- continue PPI especially in view of anticoagulation    5)Depression- stable, continue Lexapro    DVT prophylaxis:heparin infusion per VTE treatment dosing Code Status:Full  Family Communication:Discussed with patient  Disposition Plan:possible discharge home on Eliquis or Xarelto in 1-2 days if echocardiogram is unremarkable if patient continues to improve.  Consults called:None Admission status:Inpatient   Lab Results  Component Value Date   PLT 192 09/12/2016    Inpatient Medications  Scheduled Meds: . amLODipine  5 mg Oral Daily  . atorvastatin  80 mg Oral QHS  . escitalopram  20 mg Oral Daily  . irbesartan  300 mg Oral Daily  . ketorolac  15 mg Intravenous Q6H  . multivitamin with minerals  1 tablet Oral QHS  . pantoprazole  40 mg Oral Daily  . polyethylene glycol  17 g Oral Daily  . sodium chloride flush  3 mL Intravenous Q12H   Continuous Infusions: . heparin 2,000 Units/hr (09/12/16 1045)   PRN Meds:.acetaminophen **OR** acetaminophen, bisacodyl, HYDROcodone-acetaminophen, HYDROmorphone (DILAUDID) injection, ondansetron **OR** ondansetron (ZOFRAN) IV, polyethylene glycol, polyvinyl alcohol, zolpidem    Anti-infectives    None        Objective:   Vitals:   09/12/16 0500 09/12/16 0825 09/12/16 1212 09/12/16 1543  BP: (!)  142/75 117/82 (!) 118/56 100/67  Pulse: 73 72 77 69  Resp: 17 18 18 16   Temp: 98.2 F (36.8 C) 97.8 F (36.6 C) 98.3 F (36.8 C) 98.4 F (36.9 C)  TempSrc: Oral Oral  Oral  SpO2: 93% 94% 98% 96%  Weight: 113.1 kg (249 lb 6.4 oz)     Height:        Wt Readings from Last 3 Encounters:  09/12/16 113.1 kg (249 lb 6.4 oz)  09/10/16 112.9 kg (249 lb)  07/25/16 113.6 kg (250 lb 6 oz)     Intake/Output Summary (Last 24 hours) at 09/12/16 1715 Last data filed at 09/12/16 0826  Gross per 24 hour  Intake              240 ml  Output             3250 ml  Net            -3010 ml     Physical Exam  Gen:- Awake Alert,  In no apparent distress, Speaking in complete sentences HEENT:- [place].AT, No sclera icterus Neck-Supple Neck,No JVD,.  Lungs-  CTAB  CV- S1, S2 normal Abd-  +ve B.Sounds, Abd Soft, No tenderness,    Extremity/Skin:-Warm and dry    Data Review:   Micro Results Recent Results (from the past 240 hour(s))  Veritor Flu A/B Waived     Status: None   Collection Time: 09/10/16 11:15 AM  Result Value Ref Range Status   Influenza A Negative Negative Final   Influenza B Negative Negative Final    Comment: If the test is negative for the presence of influenza A or influenza B antigen, infection due to influenza cannot be ruled-out because the antigen present in the sample may be below the detection limit of the test. It is recommended that these results be confirmed by viral culture or an FDA-cleared influenza A and B molecular assay.   MRSA PCR Screening     Status: None   Collection Time: 09/10/16 10:43 PM  Result Value Ref Range Status   MRSA by PCR NEGATIVE NEGATIVE Final    Comment:        The GeneXpert MRSA Assay (FDA approved for NASAL specimens only), is one component of a comprehensive MRSA colonization surveillance program. It is not intended to diagnose MRSA infection nor to guide or monitor treatment for MRSA infections.   Respiratory Panel by PCR      Status: None   Collection Time: 09/11/16  6:10 AM  Result Value Ref Range Status   Adenovirus NOT DETECTED NOT DETECTED Final   Coronavirus 229E NOT DETECTED NOT DETECTED Final   Coronavirus HKU1 NOT DETECTED NOT DETECTED Final   Coronavirus NL63 NOT DETECTED NOT DETECTED Final   Coronavirus OC43 NOT DETECTED NOT DETECTED Final   Metapneumovirus NOT DETECTED NOT DETECTED Final   Rhinovirus / Enterovirus NOT DETECTED NOT DETECTED Final   Influenza A NOT DETECTED NOT DETECTED Final   Influenza  B NOT DETECTED NOT DETECTED Final   Parainfluenza Virus 1 NOT DETECTED NOT DETECTED Final   Parainfluenza Virus 2 NOT DETECTED NOT DETECTED Final   Parainfluenza Virus 3 NOT DETECTED NOT DETECTED Final   Parainfluenza Virus 4 NOT DETECTED NOT DETECTED Final   Respiratory Syncytial Virus NOT DETECTED NOT DETECTED Final   Bordetella pertussis NOT DETECTED NOT DETECTED Final   Chlamydophila pneumoniae NOT DETECTED NOT DETECTED Final   Mycoplasma pneumoniae NOT DETECTED NOT DETECTED Final    Radiology Reports Dg Chest 2 View  Result Date: 09/10/2016 CLINICAL DATA:  Right foot pain and swelling EXAM: CHEST  2 VIEW COMPARISON:  None. FINDINGS: There is consolidation at the posterior left lung base. Subsegmental atelectasis at the right base. Small pleural effusions are suspected. Upper normal heart size. No pneumothorax. IMPRESSION: Left lower lobe pneumonia. Followup PA and lateral chest X-ray is recommended in 3-4 weeks following trial of antibiotic therapy to ensure resolution and exclude underlying malignancy. Electronically Signed   By: Marybelle Killings M.D.   On: 09/10/2016 15:25   Ct Angio Chest Pe W And/or Wo Contrast  Result Date: 09/10/2016 CLINICAL DATA:  Short of breath, concern for pulmonary embolism. The swelling. EXAM: CT ANGIOGRAPHY CHEST WITH CONTRAST TECHNIQUE: Multidetector CT imaging of the chest was performed using the standard protocol during bolus administration of intravenous contrast.  Multiplanar CT image reconstructions and MIPs were obtained to evaluate the vascular anatomy. CONTRAST:  80 mL Isovue COMPARISON:  Indications FINDINGS: Cardiovascular: Filling defect within the proximal LEFT lower lobe pulmonary artery which is elongated tubular and extends into the subsegmental branches consist with acute pulmonary embolism. Additional filling defect within the lingular pulmonary artery. Within the segmental branches of the RIGHT lower lobe there are additional filling defects. Filling defect within the RIGHT upper lobe pulmonary artery. Overall clot burden is moderate to severe. No evidence of RIGHT ventricular strain with the RIGHT ventricle to LEFT ventricle ratio less than 1. Mediastinum/Nodes: No axillary supraclavicular adenopathy. No mediastinal adenopathy. Lungs/Pleura: Peribronchial thickening in the LEFT lower lobe. No clear evidence of infarction. No pneumonia. Small LEFT effusion. Upper Abdomen: Limited view of the liver, kidneys, pancreas are unremarkable. Normal adrenal glands. Musculoskeletal: No aggressive osseous lesion. Review of the MIP images confirms the above findings. IMPRESSION: 1. Multi lobar pulmonary emboli involving the LEFT lower lobe pulmonary arteries, RIGHT lower lobe pulmonary arteries, lingular pulmonary artery and RIGHT upper lobe pulmonary artery. 2. Overall clot burden is moderate to severe. 3. No evidence of RIGHT ventricular strain with the RIGHT ventricle ratio to LEFT ventricular ratio less than 1. 4. LEFT basilar atelectasis versus less likely infarction. Small LEFT effusion. Critical Value/emergent results were called by telephone at the time of interpretation on 09/10/2016 at 6:58 pm to Dr. Dorie Rank , who verbally acknowledged these results. Electronically Signed   By: Suzy Bouchard M.D.   On: 09/10/2016 18:59     CBC  Recent Labs Lab 09/10/16 1413 09/11/16 0852 09/12/16 0410  WBC 10.0 8.6 8.7  HGB 12.5* 11.1* 11.5*  HCT 37.1* 33.2* 34.5*    PLT 182 173 192  MCV 88.8 89.0 89.4  MCH 29.9 29.8 29.8  MCHC 33.7 33.4 33.3  RDW 13.1 12.8 13.0    Chemistries   Recent Labs Lab 09/10/16 1413 09/10/16 2344  NA 132* 134*  K 3.9 4.0  CL 97* 96*  CO2 25 28  GLUCOSE 108* 115*  BUN 10 11  CREATININE 0.80 0.85  CALCIUM 9.7 9.1   ------------------------------------------------------------------------------------------------------------------  No results for input(s): CHOL, HDL, LDLCALC, TRIG, CHOLHDL, LDLDIRECT in the last 72 hours.  No results found for: HGBA1C ------------------------------------------------------------------------------------------------------------------ No results for input(s): TSH, T4TOTAL, T3FREE, THYROIDAB in the last 72 hours.  Invalid input(s): FREET3 ------------------------------------------------------------------------------------------------------------------ No results for input(s): VITAMINB12, FOLATE, FERRITIN, TIBC, IRON, RETICCTPCT in the last 72 hours.  Coagulation profile No results for input(s): INR, PROTIME in the last 168 hours.  No results for input(s): DDIMER in the last 72 hours.  Cardiac Enzymes No results for input(s): CKMB, TROPONINI, MYOGLOBIN in the last 168 hours.  Invalid input(s): CK ------------------------------------------------------------------------------------------------------------------ No results found for: BNP   Rubel Heckard M.D on 09/12/2016 at 5:15 PM  Between 7am to 7pm - Pager - [phone]  After 7pm go to [email or web address] - password [email or web address]  [facility] -  Office  [phone]  Dragon dictation system was used to create this note, attempts have been made to correct errors, however presence of uncorrected errors is not a reflection quality of care provided

## 2016-09-12 NOTE — Care Management Note (Addendum)
Case Management Note  Patient Details  Name: [name] MRN: [id number] Date of Birth: Dec 22, 1950  Subjective/Objective:  Pt presented for Dyspnea, pleuritic pain, RLE swelling and tenderness. Positive for PE. Benefits check completed for Xarelto vs Eliquis. Pt will pay cost due to deductible has not been met. Unsure of which medication patient will be d/c on.                 Action/Plan: S/W DEBRA @ [place] # [phone]   1. XARELTO 20 MG DAILY  30 /30TAB  COVER- YES  CO-PAY- $ 417.00 DEDUCTIBLE NOT MET  TIER- 3 DRUG  PRIOR APPROVAL- NO    2. ELIQUIS 2.5 MG BID  COVER- YES  CO-PAY- $417.0 DEDUCTIBLE NOT MET  TIER- 3 DRUG  PRIOR APPROVAL- NO   3 ELIQUIS 5 MG BID  SAME AS ABOVE   PHARMACY : P[facility]ERR[facility]   Expected Discharge Date:                  Expected Discharge Plan:  Home/Self Care  In-House Referral:  NA  Discharge planning Services  CM Consult, Medication Assistance  Post Acute Care Choice:  NA Choice offered to:  NA  DME Arranged:  N/A DME Agency:  NA  HH Arranged:  NA HH Agency:  NA  Status of Service:  Completed, signed off  If discussed at [place] of [place] Meetings, dates discussed:    Additional Comments: 1012 09-14-16 Jacqlyn Krauss, RN,BSN [phone] CM did speak with pt in regards to Xarelto and deductible. Pt goes to [facility] in [place]. [facility] does not have starter kit for Xarelto in stock. CM did call [facility] on [address] and Xarelto is available. CM will make pt aware. No further needs from CM at this time.  Bethena Roys, RN 09/12/2016, 2:46 PM

## 2016-09-12 NOTE — Progress Notes (Signed)
  Echocardiogram 2D Echocardiogram has been performed.  [name] 09/12/2016, 1:53 PM

## 2016-09-12 NOTE — Progress Notes (Signed)
ANTICOAGULATION CONSULT NOTE - Follow Up Consult  Pharmacy Consult for Heparin  Indication: pulmonary embolus and DVT  No Known Allergies  Patient Measurements: Height: 6\' 1"  (185.4 cm) Weight: 249 lb 6.4 oz (113.1 kg) IBW/kg (Calculated) : 79.9  HDW: 103.8 kg  Vital Signs: Temp: 98.4 F (36.9 C) (01/22 1543) Temp Source: Oral (01/22 1543) BP: 100/67 (01/22 1543) Pulse Rate: 69 (01/22 1543)  Labs:  Recent Labs  09/10/16 1413  09/10/16 2344 09/11/16 0852 09/11/16 1743 09/12/16 0410 09/12/16 1801  HGB 12.5*  --   --  11.1*  --  11.5*  --   HCT 37.1*  --   --  33.2*  --  34.5*  --   PLT 182  --   --  173  --  192  --   HEPARINUNFRC  --   < > 0.31 0.30 0.37 0.21* 0.32  CREATININE 0.80  --  0.85  --   --   --   --   < > = values in this interval not displayed.  Estimated Creatinine Clearance: 111.2 mL/min (by C-G formula based on SCr of 0.85 mg/dL).  . heparin 2,000 Units/hr (09/12/16 1045)     Assessment: Heparin drip for new onset DVT/PE. CT shows multi lobar PE with moderate to severe clot burden.    Heparin level is within goal range this evening.  No bleeding or complications noted.  Goal of Therapy:  Heparin level 0.3-0.7 units/ml Monitor platelets by anticoagulation protocol: Yes   Plan:  Continue IV heparin at current rate. Daily heparin level and CBC.  Transition to Xarelto or Eliquis??  Uvaldo Rising, BCPS  Clinical Pharmacist Pager [phone]  09/12/2016 6:56 PM

## 2016-09-13 DIAGNOSIS — I2699 Other pulmonary embolism without acute cor pulmonale: Principal | ICD-10-CM

## 2016-09-13 DIAGNOSIS — I10 Essential (primary) hypertension: Secondary | ICD-10-CM

## 2016-09-13 DIAGNOSIS — K219 Gastro-esophageal reflux disease without esophagitis: Secondary | ICD-10-CM

## 2016-09-13 DIAGNOSIS — I824Y3 Acute embolism and thrombosis of unspecified deep veins of proximal lower extremity, bilateral: Secondary | ICD-10-CM

## 2016-09-13 DIAGNOSIS — E871 Hypo-osmolality and hyponatremia: Secondary | ICD-10-CM

## 2016-09-13 LAB — CBC
HEMATOCRIT: 34.1 % — AB (ref 39.0–52.0)
HEMOGLOBIN: 11.3 g/dL — AB (ref 13.0–17.0)
MCH: 29.4 pg (ref 26.0–34.0)
MCHC: 33.1 g/dL (ref 30.0–36.0)
MCV: 88.8 fL (ref 78.0–100.0)
Platelets: 230 10*3/uL (ref 150–400)
RBC: 3.84 MIL/uL — AB (ref 4.22–5.81)
RDW: 13.2 % (ref 11.5–15.5)
WBC: 7 10*3/uL (ref 4.0–10.5)

## 2016-09-13 LAB — BASIC METABOLIC PANEL
Anion gap: 5 (ref 5–15)
BUN: 11 mg/dL (ref 6–20)
CHLORIDE: 104 mmol/L (ref 101–111)
CO2: 29 mmol/L (ref 22–32)
CREATININE: 0.82 mg/dL (ref 0.61–1.24)
Calcium: 9.2 mg/dL (ref 8.9–10.3)
GFR calc Af Amer: >60 mL/min (ref >60)
GFR calc non Af Amer: >60 mL/min (ref >60)
Glucose, Bld: 108 mg/dL — ABNORMAL HIGH (ref 65–99)
POTASSIUM: 3.9 mmol/L (ref 3.5–5.1)
Sodium: 138 mmol/L (ref 135–145)

## 2016-09-13 LAB — HEPARIN LEVEL (UNFRACTIONATED): Heparin Unfractionated: 0.37 IU/mL (ref 0.30–0.70)

## 2016-09-13 LAB — GLUCOSE, CAPILLARY
GLUCOSE-CAPILLARY: 105 mg/dL — AB (ref 65–99)
GLUCOSE-CAPILLARY: 115 mg/dL — AB (ref 65–99)

## 2016-09-13 NOTE — Progress Notes (Signed)
PROGRESS NOTE    [name]  [id number] DOB: [birth year] DOA: 09/10/2016 PCP: Fransisca Kaufmann Dettinger, MD   Brief Narrative:  Patient admitted for sob and LE pain. He is dx with b/l LE DVTs and multiple Pulm embolism on Hep Drip   Assessment & Plan:   Principal Problem:   Pulmonary emboli (HCC) Active Problems:   Essential hypertension, benign   Depression   GERD (gastroesophageal reflux disease)   Acute DVT (deep venous thrombosis) (HCC)   Hyponatremia   Normocytic anemia   Acute pulmonary embolism ([place])  1. Acute Pulmonary Emboli, bilateral LE DVT's  - Hemodynamically stable, cardiopulmonary status appears to have improved -cont heparin drip, will start Xarelto tomorrow. Case manager to help him with further questions in terms of the cost.  - atient will most likely need lifelong anticoagulation if we are not able to identify a provoking event with reversibility.  -Patient would need Malignancy workup outpatient , may not need hypercoagulability workup was probably needs lifelong anticoagulation anyway.  -Echocardiogram - done; no right heart strain noted   2)Hyponatremia; improved - HCTZ on hold, sodium is up to 134, Replace and recheck  3)Hypertension - stable, continue Amlodipine 5mg  daily and irbesatan 300 mg daily, HCTZ on hold due to concerns about hyponatremia   4)GERD- continue PPI especially in view of anticoagulation   5)Depression- stable,continue Lexapro    DVT prophylaxis:heparin infusion per VTE treatment dosing Code Status:Full  Family Communication:Discussed with patient  Disposition Plan:possible discharge home on Eliquis today. Will ambulatory pulse ok today, discussed with, he nurse.  Consults called:None Admission status:Inpatient    Consultants:  - none  Procedures:   none  Antimicrobials:   none   Subjective: States he feels little better, He is going to try and walk around today without the oxygen today.     Objective: Vitals:   09/13/16 0012 09/13/16 0604 09/13/16 0829 09/13/16 0852  BP: 136/81 (!) 143/72 (!) 119/53 130/76  Pulse: 78 74 68   Resp: 15 16    Temp: 98.8 F (37.1 C) 98.1 F (36.7 C) 97.8 F (36.6 C)   TempSrc: Oral Oral Oral   SpO2: 92% 100% 96%   Weight:  248 lb 9.6 oz (112.8 kg)    Height:  6\' 1"  (1.854 m)      Intake/Output Summary (Last 24 hours) at 09/13/16 1054 Last data filed at 09/13/16 0606  Gross per 24 hour  Intake              922 ml  Output              975 ml  Net              -53 ml   Filed Weights   09/11/16 0018 09/12/16 0500 09/13/16 0604  Weight: 245 lb 11.2 oz (111.4 kg) 249 lb 6.4 oz (113.1 kg) 248 lb 9.6 oz (112.8 kg)    Examination:  General exam: Appears calm and comfortable  Respiratory system: Clear to auscultation. Respiratory effort normal. Cardiovascular system: S1 & S2 heard, RRR. No JVD, murmurs, rubs, gallops or clicks. No pedal edema. Gastrointestinal system: Abdomen is nondistended, soft and nontender. No organomegaly or masses felt. Normal bowel sounds heard. Central nervous system: Alert and oriented. No focal neurological deficits. Extremities: Symmetric 5 x 5 power. Skin: No rashes, lesions or ulcers Psychiatry: Judgement and insight appear normal. Mood & affect appropriate.     Data Reviewed:   CBC:  Recent Labs Lab 09/10/16 1413  09/11/16 0852 09/12/16 0410 09/13/16 0531  WBC 10.0 8.6 8.7 7.0  HGB 12.5* 11.1* 11.5* 11.3*  HCT 37.1* 33.2* 34.5* 34.1*  MCV 88.8 89.0 89.4 88.8  PLT 182 173 192 [id number]   Basic Metabolic Panel:  Recent Labs Lab 09/10/16 1413 09/10/16 2344 09/13/16 0531  NA 132* 134* 138  K 3.9 4.0 3.9  CL 97* 96* 104  CO2 25 28 29   GLUCOSE 108* 115* 108*  BUN 10 11 11   CREATININE 0.80 0.85 0.82  CALCIUM 9.7 9.1 9.2   GFR: Estimated Creatinine Clearance: 115.1 mL/min (by C-G formula based on SCr of 0.82 mg/dL). Liver Function Tests: No results for input(s): AST, ALT, ALKPHOS,  BILITOT, PROT, ALBUMIN in the last 168 hours. No results for input(s): LIPASE, AMYLASE in the last 168 hours. No results for input(s): AMMONIA in the last 168 hours. Coagulation Profile: No results for input(s): INR, PROTIME in the last 168 hours. Cardiac Enzymes: No results for input(s): CKTOTAL, CKMB, CKMBINDEX, TROPONINI in the last 168 hours. BNP (last 3 results) No results for input(s): PROBNP in the last 8760 hours. HbA1C: No results for input(s): HGBA1C in the last 72 hours. CBG:  Recent Labs Lab 09/11/16 1733 09/13/16 0539 09/13/16 0752  GLUCAP 115* 105* 115*   Lipid Profile: No results for input(s): CHOL, HDL, LDLCALC, TRIG, CHOLHDL, LDLDIRECT in the last 72 hours. Thyroid Function Tests: No results for input(s): TSH, T4TOTAL, FREET4, T3FREE, THYROIDAB in the last 72 hours. Anemia Panel: No results for input(s): VITAMINB12, FOLATE, FERRITIN, TIBC, IRON, RETICCTPCT in the last 72 hours. Sepsis Labs: No results for input(s): PROCALCITON, LATICACIDVEN in the last 168 hours.  Recent Results (from the past 240 hour(s))  Veritor Flu A/B Waived     Status: None   Collection Time: 09/10/16 11:15 AM  Result Value Ref Range Status   Influenza A Negative Negative Final   Influenza B Negative Negative Final    Comment: If the test is negative for the presence of influenza A or influenza B antigen, infection due to influenza cannot be ruled-out because the antigen present in the sample may be below the detection limit of the test. It is recommended that these results be confirmed by viral culture or an FDA-cleared influenza A and B molecular assay.   MRSA PCR Screening     Status: None   Collection Time: 09/10/16 10:43 PM  Result Value Ref Range Status   MRSA by PCR NEGATIVE NEGATIVE Final    Comment:        The GeneXpert MRSA Assay (FDA approved for NASAL specimens only), is one component of a comprehensive MRSA colonization surveillance program. It is not intended to  diagnose MRSA infection nor to guide or monitor treatment for MRSA infections.   Respiratory Panel by PCR     Status: None   Collection Time: 09/11/16  6:10 AM  Result Value Ref Range Status   Adenovirus NOT DETECTED NOT DETECTED Final   Coronavirus 229E NOT DETECTED NOT DETECTED Final   Coronavirus HKU1 NOT DETECTED NOT DETECTED Final   Coronavirus NL63 NOT DETECTED NOT DETECTED Final   Coronavirus OC43 NOT DETECTED NOT DETECTED Final   Metapneumovirus NOT DETECTED NOT DETECTED Final   Rhinovirus / Enterovirus NOT DETECTED NOT DETECTED Final   Influenza A NOT DETECTED NOT DETECTED Final   Influenza B NOT DETECTED NOT DETECTED Final   Parainfluenza Virus 1 NOT DETECTED NOT DETECTED Final   Parainfluenza Virus 2 NOT DETECTED NOT DETECTED Final  Parainfluenza Virus 3 NOT DETECTED NOT DETECTED Final   Parainfluenza Virus 4 NOT DETECTED NOT DETECTED Final   Respiratory Syncytial Virus NOT DETECTED NOT DETECTED Final   Bordetella pertussis NOT DETECTED NOT DETECTED Final   Chlamydophila pneumoniae NOT DETECTED NOT DETECTED Final   Mycoplasma pneumoniae NOT DETECTED NOT DETECTED Final         Radiology Studies: No results found.      Scheduled Meds: . amLODipine  5 mg Oral Daily  . atorvastatin  80 mg Oral QHS  . escitalopram  20 mg Oral Daily  . irbesartan  300 mg Oral Daily  . ketorolac  15 mg Intravenous Q6H  . multivitamin with minerals  1 tablet Oral QHS  . pantoprazole  40 mg Oral Daily  . polyethylene glycol  17 g Oral Daily  . sodium chloride flush  3 mL Intravenous Q12H   Continuous Infusions: . heparin 2,000 Units/hr (09/13/16 0606)     LOS: 3 days    Time spent: 45 mins    Everett Ehrler Arsenio Loader, MD [facility] Pager [phone]   If 7PM-7AM, please contact night-coverage [email or web address] Password [facility] 09/13/2016, 10:54 AM

## 2016-09-13 NOTE — Telephone Encounter (Signed)
FYI

## 2016-09-13 NOTE — Progress Notes (Signed)
ANTICOAGULATION CONSULT NOTE - Follow Up Consult  Pharmacy Consult for Heparin  Indication: pulmonary embolus and DVT  No Known Allergies  Patient Measurements: Height: 6\' 1"  (185.4 cm) Weight: 248 lb 9.6 oz (112.8 kg) IBW/kg (Calculated) : 79.9  HDW: 103.8 kg  Vital Signs: Temp: 97.8 F (36.6 C) (01/23 0829) Temp Source: Oral (01/23 0829) BP: 119/53 (01/23 0829) Pulse Rate: 68 (01/23 0829)  Labs:  Recent Labs  09/10/16 1413  09/10/16 2344 09/11/16 0852  09/12/16 0410 09/12/16 1801 09/13/16 0531  HGB 12.5*  --   --  11.1*  --  11.5*  --  11.3*  HCT 37.1*  --   --  33.2*  --  34.5*  --  34.1*  PLT 182  --   --  173  --  192  --  230  HEPARINUNFRC  --   < > 0.31 0.30  < > 0.21* 0.32 0.37  CREATININE 0.80  --  0.85  --   --   --   --  0.82  < > = values in this interval not displayed.  Estimated Creatinine Clearance: 115.1 mL/min (by C-G formula based on SCr of 0.82 mg/dL).  . heparin 2,000 Units/hr (09/13/16 0606)     Assessment: Heparin drip for new onset DVT/PE. CT shows multi lobar PE with moderate to severe clot burden.    Heparin level is therapeutic, CBC stable  Goal of Therapy:  Heparin level 0.3-0.7 units/ml Monitor platelets by anticoagulation protocol: Yes   Plan:  Continue IV heparin at 2000 units / hr Daily heparin level and CBC.  Transition to Xarelto or Eliquis??  Thank you Anette Guarneri, PharmD [phone]   09/13/2016 8:36 AM

## 2016-09-13 NOTE — Progress Notes (Signed)
Patient ambulated in the hallway with NT Roby Lofts.  O2 sats remained 97-98% on Room Air.

## 2016-09-14 DIAGNOSIS — F3341 Major depressive disorder, recurrent, in partial remission: Secondary | ICD-10-CM

## 2016-09-14 LAB — CBC
HCT: 35.4 % — ABNORMAL LOW (ref 39.0–52.0)
Hemoglobin: 11.8 g/dL — ABNORMAL LOW (ref 13.0–17.0)
MCH: 29.7 pg (ref 26.0–34.0)
MCHC: 33.3 g/dL (ref 30.0–36.0)
MCV: 89.2 fL (ref 78.0–100.0)
Platelets: 265 10*3/uL (ref 150–400)
RBC: 3.97 MIL/uL — AB (ref 4.22–5.81)
RDW: 13 % (ref 11.5–15.5)
WBC: 6.5 10*3/uL (ref 4.0–10.5)

## 2016-09-14 LAB — GLUCOSE, CAPILLARY: GLUCOSE-CAPILLARY: 105 mg/dL — AB (ref 65–99)

## 2016-09-14 LAB — HEPARIN LEVEL (UNFRACTIONATED): Heparin Unfractionated: 0.49 IU/mL (ref 0.30–0.70)

## 2016-09-14 MED ORDER — RIVAROXABAN 15 MG PO TABS
15.0000 mg | ORAL_TABLET | Freq: Two times a day (BID) | ORAL | Status: DC
  Administered 2016-09-14: 15 mg via ORAL
  Filled 2016-09-14: qty 1

## 2016-09-14 MED ORDER — RIVAROXABAN (XARELTO) VTE STARTER PACK (15 & 20 MG): ORAL_TABLET | ORAL | 0 refills | Status: DC

## 2016-09-14 MED ORDER — RIVAROXABAN 15 MG PO TABS: 15.0000 mg | ORAL_TABLET | Freq: Two times a day (BID) | ORAL | Status: DC

## 2016-09-14 NOTE — Progress Notes (Signed)
Pt as well as wife given education on follow-up appointments, medications, self care, & side effects. Pt's IV & telemetry monitor was d/c'd. All of the pt's questions were answered & pt was given his prescriptions. All of the pt's belongings were given to him & taken home.  Hoover Brunette, RN

## 2016-09-14 NOTE — Progress Notes (Signed)
PROGRESS NOTE    [name]  [id number] DOB: 23-Jan-1951 DOA: 09/10/2016 PCP: Fransisca Kaufmann Dettinger, MD   Brief Narrative:  Patient admitted for sob and LE pain. He is dx with b/l LE DVTs and multiple Pulm embolism on Hep Drip transitioning to xarelto on discharge.    Assessment & Plan:   Principal Problem:   Pulmonary emboli (HCC) Active Problems:   Essential hypertension, benign   Depression   GERD (gastroesophageal reflux disease)   Acute DVT (deep venous thrombosis) (HCC)   Hyponatremia   Normocytic anemia   Acute pulmonary embolism ([place])  1. Acute Pulmonary Emboli, bilateral LE DVT's  - Hemodynamically stable, cardiopulmonary status appears to have improved -d/c heparin drip, start  Xarelto. One month prescription given, should get the remaining from his PCP when he follows up. He has an appointment in couple of day.   - patient will most likely need lifelong anticoagulation if we are not able to identify a provoking event with reversibility.  -Patient would need Malignancy workup outpatient , may not need hypercoagulability workup was probably needs lifelong anticoagulation anyway.  -Echocardiogram - done; no right heart strain noted   2)Hyponatremia; resolved - resume home meds   3)Hypertension - stable, resume home meds on discharge.   4)GERD- continue PPI especially in view of anticoagulation   5)Depression- stable,continue Lexapro    DVT prophylaxis: Xarelto  Code Status:Full  Family Communication:Discussed with patient  Disposition Plan:Discharge today on Xarelto  Consults called:None    Consultants:  - none  Procedures:   none  Antimicrobials:   none   Subjective: States he feels great. He has been ambulating in the hallway without any difficulty. He is eager to go home and has already made an appointment with his PCP. He understands and will recognize the symptoms of his PE/DVT, in case if he has to seek urgent medical  attention (such as but not limited to chest pain, sob, feel lightheadedness, dizziness, calf pain)  Objective: Vitals:   09/13/16 1646 09/13/16 2025 09/14/16 0053 09/14/16 0413  BP: 123/68 135/76 133/73 132/72  Pulse: 72 63 72 76  Resp:      Temp: 98.2 F (36.8 C) 98.3 F (36.8 C) 98.6 F (37 C) 98.3 F (36.8 C)  TempSrc: Oral Oral Oral Oral  SpO2: 96% 97% 96% 93%  Weight:    113.1 kg (249 lb 4.8 oz)  Height:        Intake/Output Summary (Last 24 hours) at 09/14/16 1057 Last data filed at 09/14/16 0715  Gross per 24 hour  Intake              704 ml  Output             1450 ml  Net             -746 ml   Filed Weights   09/12/16 0500 09/13/16 0604 09/14/16 0413  Weight: 113.1 kg (249 lb 6.4 oz) 112.8 kg (248 lb 9.6 oz) 113.1 kg (249 lb 4.8 oz)    Examination:  General exam: Appears calm and comfortable  Respiratory system: Clear to auscultation. Respiratory effort normal. Cardiovascular system: S1 & S2 heard, RRR. No JVD, murmurs, rubs, gallops or clicks. No pedal edema. Gastrointestinal system: Abdomen is nondistended, soft and nontender. No organomegaly or masses felt. Normal bowel sounds heard. Central nervous system: Alert and oriented. No focal neurological deficits. Extremities: Symmetric 5 x 5 power. Skin: No rashes, lesions or ulcers Psychiatry: Judgement and insight appear  normal. Mood & affect appropriate.     Data Reviewed:   CBC:  Recent Labs Lab 09/10/16 1413 09/11/16 0852 09/12/16 0410 09/13/16 0531 09/14/16 0515  WBC 10.0 8.6 8.7 7.0 6.5  HGB 12.5* 11.1* 11.5* 11.3* 11.8*  HCT 37.1* 33.2* 34.5* 34.1* 35.4*  MCV 88.8 89.0 89.4 88.8 89.2  PLT 182 173 192 230 [id number]   Basic Metabolic Panel:  Recent Labs Lab 09/10/16 1413 09/10/16 2344 09/13/16 0531  NA 132* 134* 138  K 3.9 4.0 3.9  CL 97* 96* 104  CO2 25 28 29   GLUCOSE 108* 115* 108*  BUN 10 11 11   CREATININE 0.80 0.85 0.82  CALCIUM 9.7 9.1 9.2   GFR: Estimated Creatinine Clearance:  115.2 mL/min (by C-G formula based on SCr of 0.82 mg/dL). Liver Function Tests: No results for input(s): AST, ALT, ALKPHOS, BILITOT, PROT, ALBUMIN in the last 168 hours. No results for input(s): LIPASE, AMYLASE in the last 168 hours. No results for input(s): AMMONIA in the last 168 hours. Coagulation Profile: No results for input(s): INR, PROTIME in the last 168 hours. Cardiac Enzymes: No results for input(s): CKTOTAL, CKMB, CKMBINDEX, TROPONINI in the last 168 hours. BNP (last 3 results) No results for input(s): PROBNP in the last 8760 hours. HbA1C: No results for input(s): HGBA1C in the last 72 hours. CBG:  Recent Labs Lab 09/11/16 1733 09/13/16 0539 09/13/16 0752 09/14/16 0741  GLUCAP 115* 105* 115* 105*   Lipid Profile: No results for input(s): CHOL, HDL, LDLCALC, TRIG, CHOLHDL, LDLDIRECT in the last 72 hours. Thyroid Function Tests: No results for input(s): TSH, T4TOTAL, FREET4, T3FREE, THYROIDAB in the last 72 hours. Anemia Panel: No results for input(s): VITAMINB12, FOLATE, FERRITIN, TIBC, IRON, RETICCTPCT in the last 72 hours. Sepsis Labs: No results for input(s): PROCALCITON, LATICACIDVEN in the last 168 hours.  Recent Results (from the past 240 hour(s))  Veritor Flu A/B Waived     Status: None   Collection Time: 09/10/16 11:15 AM  Result Value Ref Range Status   Influenza A Negative Negative Final   Influenza B Negative Negative Final    Comment: If the test is negative for the presence of influenza A or influenza B antigen, infection due to influenza cannot be ruled-out because the antigen present in the sample may be below the detection limit of the test. It is recommended that these results be confirmed by viral culture or an FDA-cleared influenza A and B molecular assay.   MRSA PCR Screening     Status: None   Collection Time: 09/10/16 10:43 PM  Result Value Ref Range Status   MRSA by PCR NEGATIVE NEGATIVE Final    Comment:        The GeneXpert MRSA  Assay (FDA approved for NASAL specimens only), is one component of a comprehensive MRSA colonization surveillance program. It is not intended to diagnose MRSA infection nor to guide or monitor treatment for MRSA infections.   Respiratory Panel by PCR     Status: None   Collection Time: 09/11/16  6:10 AM  Result Value Ref Range Status   Adenovirus NOT DETECTED NOT DETECTED Final   Coronavirus 229E NOT DETECTED NOT DETECTED Final   Coronavirus HKU1 NOT DETECTED NOT DETECTED Final   Coronavirus NL63 NOT DETECTED NOT DETECTED Final   Coronavirus OC43 NOT DETECTED NOT DETECTED Final   Metapneumovirus NOT DETECTED NOT DETECTED Final   Rhinovirus / Enterovirus NOT DETECTED NOT DETECTED Final   Influenza A NOT DETECTED NOT DETECTED Final  Influenza B NOT DETECTED NOT DETECTED Final   Parainfluenza Virus 1 NOT DETECTED NOT DETECTED Final   Parainfluenza Virus 2 NOT DETECTED NOT DETECTED Final   Parainfluenza Virus 3 NOT DETECTED NOT DETECTED Final   Parainfluenza Virus 4 NOT DETECTED NOT DETECTED Final   Respiratory Syncytial Virus NOT DETECTED NOT DETECTED Final   Bordetella pertussis NOT DETECTED NOT DETECTED Final   Chlamydophila pneumoniae NOT DETECTED NOT DETECTED Final   Mycoplasma pneumoniae NOT DETECTED NOT DETECTED Final         Radiology Studies: No results found.      Scheduled Meds: . amLODipine  5 mg Oral Daily  . atorvastatin  80 mg Oral QHS  . escitalopram  20 mg Oral Daily  . irbesartan  300 mg Oral Daily  . ketorolac  15 mg Intravenous Q6H  . multivitamin with minerals  1 tablet Oral QHS  . pantoprazole  40 mg Oral Daily  . polyethylene glycol  17 g Oral Daily  . Rivaroxaban  15 mg Oral BID WC  . sodium chloride flush  3 mL Intravenous Q12H   Continuous Infusions:    LOS: 4 days    Time spent: 35 mins    Ankit Arsenio Loader, MD [facility] Pager [phone]   If 7PM-7AM, please contact night-coverage [email or web address] Password  [facility] 09/14/2016, 10:57 AM

## 2016-09-14 NOTE — Care Management Important Message (Signed)
Important Message  Patient Details  Name: [name] MRN: [id number] Date of Birth: 04/26/1951   Medicare Important Message Given:  Yes    Lliam Hoh Abena 09/14/2016, 1:39 PM

## 2016-09-14 NOTE — Discharge Instructions (Addendum)
Information on my medicine - XARELTO® (rivaroxaban) ° °This medication education was reviewed with me or my healthcare representative as part of my discharge preparation.   ° °WHY WAS XARELTO® PRESCRIBED FOR YOU? °Xarelto® was prescribed to treat blood clots that may have been found in the veins of your legs (deep vein thrombosis) or in your lungs (pulmonary embolism) and to reduce the risk of them occurring again. ° °What do you need to know about Xarelto®? °The starting dose is one 15 mg tablet taken TWICE daily with food for the FIRST 21 DAYS then the dose is changed to one 20 mg tablet taken ONCE A DAY with your evening meal. ° °DO NOT stop taking Xarelto® without talking to the health care provider who prescribed the medication.  Refill your prescription for 20 mg tablets before you run out. ° °After discharge, you should have regular check-up appointments with your healthcare provider that is prescribing your Xarelto®.  In the future your dose may need to be changed if your kidney function changes by a significant amount. ° °What do you do if you miss a dose? °If you are taking Xarelto® TWICE DAILY and you miss a dose, take it as soon as you remember. You may take two 15 mg tablets (total 30 mg) at the same time then resume your regularly scheduled 15 mg twice daily the next day. ° °If you are taking Xarelto® ONCE DAILY and you miss a dose, take it as soon as you remember on the same day then continue your regularly scheduled once daily regimen the next day. Do not take two doses of Xarelto® at the same time.  ° °Important Safety Information °Xarelto® is a blood thinner medicine that can cause bleeding. You should call your healthcare provider right away if you experience any of the following: °? Bleeding from an injury or your nose that does not stop. °? Unusual colored urine (red or dark brown) or unusual colored stools (red or black). °? Unusual bruising for unknown reasons. °? A serious fall or if you hit  your head (even if there is no bleeding). ° °Some medicines may interact with Xarelto® and might increase your risk of bleeding while on Xarelto®. To help avoid this, consult your healthcare provider or pharmacist prior to using any new prescription or non-prescription medications, including herbals, vitamins, non-steroidal anti-inflammatory drugs (NSAIDs) and supplements. ° °This website has more information on Xarelto®: www.xarelto.com. °

## 2016-09-14 NOTE — Discharge Summary (Signed)
Physician Discharge Summary  [name] UWL:[id number] DOB: Jun 01, 1951 DOA: 09/10/2016  PCP: Elige Radon Dettinger, MD  Admit date: 09/10/2016 Discharge date: 09/14/2016  Admitted From: Home Disposition:  Home  Recommendations for Outpatient Follow-up:  1. Follow up with PCP in 1week   Home Health:No Equipment/Devices:No  Discharge Condition:Stable CODE STATUS: Full Diet recommendation:2g Na  Brief/Interim Summary: [name] is a 66 y.o. male with medical history significant for hypertension, GERD, and depression, now presenting to the ED from his PCP office for evaluation of shortness of breath, pleuritic pain, and right leg swelling and tenderness. Patient reports that he had been in his usual state of health until approximately 3-4 weeks ago when he developed swelling and tenderness in the right lower extremity without history of preceding trauma. These symptoms persisted and he soon developed some dyspnea with left lateral chest pain with deep inspiration or cough. No pleuritic pain and dyspnea worsened acutely over the past day, prompting his presentation to a same-day clinic today. He was noted to be febrile at the office visit, but the history was very concerning for DVT and PE, and so he was directed to the emergency department. Patient reports a history of VTE in his mother, but denies personal history of such and denies any recent immobilization, long distance travel, trauma, or new medication. He is a nonsmoker and does not use hormone therapy. He had a colonoscopy performed in 2005 and is scheduled for a repeat routine screening colonoscopy in February 2018. He reports frequent urination chronically which had been attributed to his prostate and he underwent a TURP approximately 8 years ago in [place], [place] with biopsies reportedly negative for malignancy at that time.  Upon work up in the ED and in the hospital he was noted to have b/l Pulm embolism and LE DVT. He was started on  Heparin drip. Echo did not show any right heart strain and his symptoms gradually improved. He was weaned off O2. Over the course of couple of days he was ambulating well in the hallway on Room air. Different anticoagulation options were presented to him including differences between coumadin and NOAC. He decided to go with Xarelto. With the help of Cm, it was determined he will get one month free supply there after he will have to pay for the medication until he meets his deductible. Patient was agreeable to it. He was deemed stable today be discharged as he has reached maximum inpatient benefit from his stay. He can follow up outpatient with his PCP within a week. He is advised to get outpatient routine malignancy work up. He is also educated on alarming signs and symptoms of worsening of his Pulm embolism and DVTs. He understand them well and is able to tell them back to me.  All the questions answered.   Discharge Diagnoses:  Principal Problem:   Pulmonary emboli (HCC) Active Problems:   Essential hypertension, benign   Depression   GERD (gastroesophageal reflux disease)   Acute DVT (deep venous thrombosis) (HCC)   Hyponatremia   Normocytic anemia   Acute pulmonary embolism (HCC)   Acute Pulmonary Emboli, bilateral LE DVT's  -Hemodynamically stable, cardiopulmonary status appears to have improved -d/c heparin drip, start  Xarelto. One month prescription given, should get the remaining from his PCP when he follows up. He has an appointment in couple of day.   - patient will most likely need lifelong anticoagulation if we are not able to identify a provoking event with reversibility.  -Patient would  need Malignancy workup outpatient , may not need hypercoagulability workup was probably needs lifelong anticoagulation anyway.  -Echocardiogram- done; no right heart strain noted  Hypertension - stable, resume home meds on discharge.   GERD- continue PPI especially in view of  anticoagulation  Depression- stable,continue Lexapro   Discharge Instructions   Allergies as of 09/14/2016   No Known Allergies     Medication List    TAKE these medications   amLODipine 5 MG tablet Commonly known as:  NORVASC Take 1 tablet (5 mg total) by mouth daily.   atorvastatin 80 MG tablet Commonly known as:  LIPITOR Take 1 tablet (80 mg total) by mouth daily. What changed:  when to take this   diclofenac 75 MG EC tablet Commonly known as:  VOLTAREN Take 1 tablet (75 mg total) by mouth 2 (two) times daily as needed.   ECOTRIN 325 MG EC tablet Generic drug:  aspirin Take 325 mg by mouth daily after supper.   escitalopram 20 MG tablet Commonly known as:  LEXAPRO Take 1 tablet (20 mg total) by mouth daily.   EXCEDRIN EXTRA STRENGTH 250-250-65 MG tablet Generic drug:  aspirin-acetaminophen-caffeine Take 2 tablets by mouth daily.   multivitamin with minerals Tabs tablet Take 1 tablet by mouth at bedtime.   omeprazole 20 MG capsule Commonly known as:  PRILOSEC Take 1 capsule (20 mg total) by mouth daily.   QUNOL ULTRA COQ10 PO Take by mouth daily.   REFRESH OP Place 1 drop into both eyes 2 (two) times daily as needed (dry eyes).   Rivaroxaban 15 & 20 MG Tbpk Take as directed on package: Start with one '15mg'$  tablet by mouth twice a day with food. On Day 22, switch to one '20mg'$  tablet once a day with food.   Rivaroxaban 15 MG Tabs tablet Commonly known as:  XARELTO Take 1 tablet (15 mg total) by mouth 2 (two) times daily with a meal.   sildenafil 100 MG tablet Commonly known as:  VIAGRA Take 0.5-1 tablets (50-100 mg total) by mouth daily as needed for erectile dysfunction. What changed:  how much to take   SUDAFED 12 HOUR PO Take 1 tablet by mouth at bedtime as needed (sinus congestion).   TURMERIC PO Take 3 capsules by mouth daily.   valsartan-hydrochlorothiazide 320-12.5 MG tablet Commonly known as:  DIOVAN-HCT Take 1 tablet by mouth daily.    VITAMIN B-12 PO Take 1 tablet by mouth daily.      Follow-up Information    [facility] Follow up.   Why:  Please take Xarelto Starter Kit Rx to [facility] on [address] to get medication filled. Thanks Contact information: [address] [place], [place] [address] [phone]       Fransisca Kaufmann Dettinger, MD Follow up in 1 week(s).   Specialties:  Family Medicine, Cardiology Contact information: [place] [address] [phone]          No Known Allergies  Consultations:  None   Procedures/Studies: Dg Chest 2 View  Result Date: 09/10/2016 CLINICAL DATA:  Right foot pain and swelling EXAM: CHEST  2 VIEW COMPARISON:  None. FINDINGS: There is consolidation at the posterior left lung base. Subsegmental atelectasis at the right base. Small pleural effusions are suspected. Upper normal heart size. No pneumothorax. IMPRESSION: Left lower lobe pneumonia. Followup PA and lateral chest X-ray is recommended in 3-4 weeks following trial of antibiotic therapy to ensure resolution and exclude underlying malignancy. Electronically Signed   By:  Marybelle Killings M.D.   On: 09/10/2016 15:25   Ct Angio Chest Pe W And/or Wo Contrast  Result Date: 09/10/2016 CLINICAL DATA:  Short of breath, concern for pulmonary embolism. The swelling. EXAM: CT ANGIOGRAPHY CHEST WITH CONTRAST TECHNIQUE: Multidetector CT imaging of the chest was performed using the standard protocol during bolus administration of intravenous contrast. Multiplanar CT image reconstructions and MIPs were obtained to evaluate the vascular anatomy. CONTRAST:  80 mL Isovue COMPARISON:  Indications FINDINGS: Cardiovascular: Filling defect within the proximal LEFT lower lobe pulmonary artery which is elongated tubular and extends into the subsegmental branches consist with acute pulmonary embolism. Additional filling defect within the lingular pulmonary artery. Within the segmental branches of the RIGHT lower lobe there are  additional filling defects. Filling defect within the RIGHT upper lobe pulmonary artery. Overall clot burden is moderate to severe. No evidence of RIGHT ventricular strain with the RIGHT ventricle to LEFT ventricle ratio less than 1. Mediastinum/Nodes: No axillary supraclavicular adenopathy. No mediastinal adenopathy. Lungs/Pleura: Peribronchial thickening in the LEFT lower lobe. No clear evidence of infarction. No pneumonia. Small LEFT effusion. Upper Abdomen: Limited view of the liver, kidneys, pancreas are unremarkable. Normal adrenal glands. Musculoskeletal: No aggressive osseous lesion. Review of the MIP images confirms the above findings. IMPRESSION: 1. Multi lobar pulmonary emboli involving the LEFT lower lobe pulmonary arteries, RIGHT lower lobe pulmonary arteries, lingular pulmonary artery and RIGHT upper lobe pulmonary artery. 2. Overall clot burden is moderate to severe. 3. No evidence of RIGHT ventricular strain with the RIGHT ventricle ratio to LEFT ventricular ratio less than 1. 4. LEFT basilar atelectasis versus less likely infarction. Small LEFT effusion. Critical Value/emergent results were called by telephone at the time of interpretation on 09/10/2016 at 6:58 pm to Dr. Dorie Rank , who verbally acknowledged these results. Electronically Signed   By: Suzy Bouchard M.D.   On: 09/10/2016 18:59       Subjective:   Discharge Exam: Vitals:   09/14/16 0053 09/14/16 0413  BP: 133/73 132/72  Pulse: 72 76  Resp:    Temp: 98.6 F (37 C) 98.3 F (36.8 C)   Vitals:   09/13/16 1646 09/13/16 2025 09/14/16 0053 09/14/16 0413  BP: 123/68 135/76 133/73 132/72  Pulse: 72 63 72 76  Resp:      Temp: 98.2 F (36.8 C) 98.3 F (36.8 C) 98.6 F (37 C) 98.3 F (36.8 C)  TempSrc: Oral Oral Oral Oral  SpO2: 96% 97% 96% 93%  Weight:    113.1 kg (249 lb 4.8 oz)  Height:        General: Pt is alert, awake, not in acute distress Cardiovascular: RRR, S1/S2 +, no rubs, no gallops Respiratory:  CTA bilaterally, no wheezing, no rhonchi Abdominal: Soft, NT, ND, bowel sounds + Extremities: no edema, no cyanosis    The results of significant diagnostics from this hospitalization (including imaging, microbiology, ancillary and laboratory) are listed below for reference.     Microbiology: Recent Results (from the past 240 hour(s))  Veritor Flu A/B Waived     Status: None   Collection Time: 09/10/16 11:15 AM  Result Value Ref Range Status   Influenza A Negative Negative Final   Influenza B Negative Negative Final    Comment: If the test is negative for the presence of influenza A or influenza B antigen, infection due to influenza cannot be ruled-out because the antigen present in the sample may be below the detection limit of the test. It is recommended that  these results be confirmed by viral culture or an FDA-cleared influenza A and B molecular assay.   MRSA PCR Screening     Status: None   Collection Time: 09/10/16 10:43 PM  Result Value Ref Range Status   MRSA by PCR NEGATIVE NEGATIVE Final    Comment:        The GeneXpert MRSA Assay (FDA approved for NASAL specimens only), is one component of a comprehensive MRSA colonization surveillance program. It is not intended to diagnose MRSA infection nor to guide or monitor treatment for MRSA infections.   Respiratory Panel by PCR     Status: None   Collection Time: 09/11/16  6:10 AM  Result Value Ref Range Status   Adenovirus NOT DETECTED NOT DETECTED Final   Coronavirus 229E NOT DETECTED NOT DETECTED Final   Coronavirus HKU1 NOT DETECTED NOT DETECTED Final   Coronavirus NL63 NOT DETECTED NOT DETECTED Final   Coronavirus OC43 NOT DETECTED NOT DETECTED Final   Metapneumovirus NOT DETECTED NOT DETECTED Final   Rhinovirus / Enterovirus NOT DETECTED NOT DETECTED Final   Influenza A NOT DETECTED NOT DETECTED Final   Influenza B NOT DETECTED NOT DETECTED Final   Parainfluenza Virus 1 NOT DETECTED NOT DETECTED Final    Parainfluenza Virus 2 NOT DETECTED NOT DETECTED Final   Parainfluenza Virus 3 NOT DETECTED NOT DETECTED Final   Parainfluenza Virus 4 NOT DETECTED NOT DETECTED Final   Respiratory Syncytial Virus NOT DETECTED NOT DETECTED Final   Bordetella pertussis NOT DETECTED NOT DETECTED Final   Chlamydophila pneumoniae NOT DETECTED NOT DETECTED Final   Mycoplasma pneumoniae NOT DETECTED NOT DETECTED Final     Labs: BNP (last 3 results) No results for input(s): BNP in the last 8760 hours. Basic Metabolic Panel:  Recent Labs Lab 09/10/16 1413 09/10/16 2344 09/13/16 0531  NA 132* 134* 138  K 3.9 4.0 3.9  CL 97* 96* 104  CO2 _0 GLUCOSE 108* 115* 108*  BUN _1 CREATININE 0.80 0.85 0.82  CALCIUM 9.7 9.1 9.2   Liver Function Tests: No results for input(s): AST, ALT, ALKPHOS, BILITOT, PROT, ALBUMIN in the last 168 hours. No results for input(s): LIPASE, AMYLASE in the last 168 hours. No results for input(s): AMMONIA in the last 168 hours. CBC:  Recent Labs Lab 09/10/16 1413 09/11/16 0852 09/12/16 0410 09/13/16 0531 09/14/16 0515  WBC 10.0 8.6 8.7 7.0 6.5  HGB 12.5* 11.1* 11.5* 11.3* 11.8*  HCT 37.1* 33.2* 34.5* 34.1* 35.4*  MCV 88.8 89.0 89.4 88.8 89.2  PLT 182 173 192 230 265   Cardiac Enzymes: No results for input(s): CKTOTAL, CKMB, CKMBINDEX, TROPONINI in the last 168 hours. BNP: Invalid input(s): POCBNP CBG:  Recent Labs Lab 09/11/16 1733 09/13/16 0539 09/13/16 0752 09/14/16 0741  GLUCAP 115* 105* 115* 105*   D-Dimer No results for input(s): DDIMER in the last 72 hours. Hgb A1c No results for input(s): HGBA1C in the last 72 hours. Lipid Profile No results for input(s): CHOL, HDL, LDLCALC, TRIG, CHOLHDL, LDLDIRECT in the last 72 hours. Thyroid function studies No results for input(s): TSH, T4TOTAL, T3FREE, THYROIDAB in the last 72 hours.  Invalid input(s): FREET3 Anemia work up No results for input(s): VITAMINB12, FOLATE, FERRITIN, TIBC, IRON,  RETICCTPCT in the last 72 hours. Urinalysis No results found for: COLORURINE, APPEARANCEUR, [place], [place], GLUCOSEU, [place], BILIRUBINUR, KETONESUR, PROTEINUR, UROBILINOGEN, NITRITE, LEUKOCYTESUR Sepsis Labs Invalid input(s): PROCALCITONIN,  WBC,  LACTICIDVEN Microbiology Recent Results (from the past 240 hour(s))  Veritor  Flu A/B Waived     Status: None   Collection Time: 09/10/16 11:15 AM  Result Value Ref Range Status   Influenza A Negative Negative Final   Influenza B Negative Negative Final    Comment: If the test is negative for the presence of influenza A or influenza B antigen, infection due to influenza cannot be ruled-out because the antigen present in the sample may be below the detection limit of the test. It is recommended that these results be confirmed by viral culture or an FDA-cleared influenza A and B molecular assay.   MRSA PCR Screening     Status: None   Collection Time: 09/10/16 10:43 PM  Result Value Ref Range Status   MRSA by PCR NEGATIVE NEGATIVE Final    Comment:        The GeneXpert MRSA Assay (FDA approved for NASAL specimens only), is one component of a comprehensive MRSA colonization surveillance program. It is not intended to diagnose MRSA infection nor to guide or monitor treatment for MRSA infections.   Respiratory Panel by PCR     Status: None   Collection Time: 09/11/16  6:10 AM  Result Value Ref Range Status   Adenovirus NOT DETECTED NOT DETECTED Final   Coronavirus 229E NOT DETECTED NOT DETECTED Final   Coronavirus HKU1 NOT DETECTED NOT DETECTED Final   Coronavirus NL63 NOT DETECTED NOT DETECTED Final   Coronavirus OC43 NOT DETECTED NOT DETECTED Final   Metapneumovirus NOT DETECTED NOT DETECTED Final   Rhinovirus / Enterovirus NOT DETECTED NOT DETECTED Final   Influenza A NOT DETECTED NOT DETECTED Final   Influenza B NOT DETECTED NOT DETECTED Final   Parainfluenza Virus 1 NOT DETECTED NOT DETECTED Final   Parainfluenza Virus 2 NOT  DETECTED NOT DETECTED Final   Parainfluenza Virus 3 NOT DETECTED NOT DETECTED Final   Parainfluenza Virus 4 NOT DETECTED NOT DETECTED Final   Respiratory Syncytial Virus NOT DETECTED NOT DETECTED Final   Bordetella pertussis NOT DETECTED NOT DETECTED Final   Chlamydophila pneumoniae NOT DETECTED NOT DETECTED Final   Mycoplasma pneumoniae NOT DETECTED NOT DETECTED Final     Time coordinating discharge: Over 30 minutes  SIGNED:   Damita Lack, MD  [facility] 09/14/2016, 11:05 AM Pager   If 7PM-7AM, please contact night-coverage [email or web address] Password [email or web address]

## 2016-09-16 ENCOUNTER — Ambulatory Visit (INDEPENDENT_AMBULATORY_CARE_PROVIDER_SITE_OTHER): Payer: Medicare Other | Admitting: Family Medicine

## 2016-09-16 ENCOUNTER — Encounter: Payer: Self-pay | Admitting: Family Medicine

## 2016-09-16 VITALS — BP 113/77 | HR 88 | Temp 98.9°F | Ht 73.0 in | Wt 246.0 lb

## 2016-09-16 DIAGNOSIS — Z8249 Family history of ischemic heart disease and other diseases of the circulatory system: Secondary | ICD-10-CM | POA: Diagnosis not present

## 2016-09-16 DIAGNOSIS — I2699 Other pulmonary embolism without acute cor pulmonale: Secondary | ICD-10-CM

## 2016-09-16 DIAGNOSIS — I824Y3 Acute embolism and thrombosis of unspecified deep veins of proximal lower extremity, bilateral: Secondary | ICD-10-CM

## 2016-09-16 MED ORDER — RIVAROXABAN 20 MG PO TABS: 20.0000 mg | ORAL_TABLET | Freq: Every day | ORAL | 3 refills | Status: DC

## 2016-09-16 NOTE — Progress Notes (Signed)
BP 113/77   Pulse 88   Temp 98.9 F (37.2 C) (Oral)   Ht 6\' 1"  (1.854 m)   Wt 246 lb (111.6 kg)   BMI 32.46 kg/m    Subjective:    Patient ID: [name], male    DOB: 02/18/1951, 66 y.o.   MRN: [id number]  HPI: [name] is a 66 y.o. male presenting on 09/16/2016 for Hospitalization Follow-up (pt here today following up after being in the hospital for PE)   HPI Hospital follow-up for pulmonary emboli and DVT Patient was seen here in our clinic and sent to the emergency department on 09/11/2015 and found to have scattered pulmonary emboli and bilateral DVTs. He was having shortness of breath and right lower extremity swelling and left lower chest pain of which she still has a trace amount of right lower extremity swelling but the chest pain and the shortness of breath has resolved and he is feeling a lot better and closer back to his normal. He denies any long travels or prolonged sitting or history of cancer or history of blood clotting disorders in the family. He does admit that it is family there are some history of aneurysms. After the hospitalization he was started on Xarelto 15 mg twice a day and given a starter pack to transition him over the next few weeks on 2 Xarelto 20 mg daily. He needs a prescription for the 20 mg daily. He denies having any bleeding since the hospitalization and generally he is feeling a lot better since leaving the hospital. His blood pressure has been doing well and he stopped both his aspirin and other anti-inflammatories since he is now on the Xarelto.  Relevant past medical, surgical, family and social history reviewed and updated as indicated. Interim medical history since our last visit reviewed. Allergies and medications reviewed and updated.  Review of Systems  Constitutional: Negative for chills and fever.  HENT: Negative for congestion.   Eyes: Negative for discharge.  Respiratory: Positive for shortness of breath. Negative for cough and  wheezing.   Cardiovascular: Positive for chest pain. Negative for leg swelling.  Musculoskeletal: Negative for back pain and gait problem.  Skin: Negative for color change and rash.  Neurological: Negative for dizziness, weakness, light-headedness and headaches.  All other systems reviewed and are negative.   Per HPI unless specifically indicated above     Objective:    BP 113/77   Pulse 88   Temp 98.9 F (37.2 C) (Oral)   Ht 6\' 1"  (1.854 m)   Wt 246 lb (111.6 kg)   BMI 32.46 kg/m   Wt Readings from Last 3 Encounters:  09/16/16 246 lb (111.6 kg)  09/14/16 249 lb 4.8 oz (113.1 kg)  09/10/16 249 lb (112.9 kg)    Physical Exam  Constitutional: He is oriented to person, place, and time. He appears well-developed and well-nourished. No distress.  Eyes: Conjunctivae are normal. Right eye exhibits no discharge. Left eye exhibits no discharge. No scleral icterus.  Neck: Neck supple. No thyromegaly present.  Cardiovascular: Normal rate, regular rhythm, normal heart sounds and intact distal pulses.   No murmur heard. Pulmonary/Chest: Effort normal and breath sounds normal. No respiratory distress. He has no wheezes. He has no rales.  Musculoskeletal: Normal range of motion. He exhibits no edema.  Lymphadenopathy:    He has no cervical adenopathy.  Neurological: He is alert and oriented to person, place, and time. Coordination normal.  Skin: Skin is warm and dry. No  rash noted. He is not diaphoretic.  Psychiatric: He has a normal mood and affect. His behavior is normal.  Nursing note and vitals reviewed.     Assessment & Plan:   Problem List Items Addressed This Visit      Cardiovascular and Mediastinum   Pulmonary emboli ([place]) - Primary   Relevant Medications   rivaroxaban (XARELTO) 20 MG TABS tablet   Other Relevant Orders   Ambulatory referral to Hematology   US Aorta   Acute DVT (deep venous thrombosis) (HCC)   Relevant Medications   rivaroxaban (XARELTO) 20 MG TABS  tablet   Other Relevant Orders   Ambulatory referral to Hematology   US Aorta    Other Visit Diagnoses    Family history of aortic aneurysm       Relevant Orders   US Aorta       Follow up plan: Return in about 3 months (around 12/15/2016), or if symptoms worsen or fail to improve, for Hypertension and PE follow-up.  Counseling provided for all of the vaccine components Orders Placed This Encounter  Procedures  . US Aorta  . Ambulatory referral to Hematology    Caryl Pina, MD [facility] 09/16/2016, 11:39 AM

## 2016-09-21 ENCOUNTER — Ambulatory Visit (HOSPITAL_COMMUNITY)
Admission: RE | Admit: 2016-09-21 | Discharge: 2016-09-21 | Disposition: A | Discharge status: Home / Self Care | Payer: Medicare Other | Source: Ambulatory Visit | Attending: Family Medicine | Admitting: Family Medicine

## 2016-09-21 DIAGNOSIS — I824Y3 Acute embolism and thrombosis of unspecified deep veins of proximal lower extremity, bilateral: Secondary | ICD-10-CM

## 2016-09-21 DIAGNOSIS — Z8249 Family history of ischemic heart disease and other diseases of the circulatory system: Secondary | ICD-10-CM | POA: Insufficient documentation

## 2016-09-21 DIAGNOSIS — I2699 Other pulmonary embolism without acute cor pulmonale: Secondary | ICD-10-CM

## 2016-09-21 DIAGNOSIS — Z136 Encounter for screening for cardiovascular disorders: Secondary | ICD-10-CM | POA: Diagnosis not present

## 2016-09-21 DIAGNOSIS — Z8489 Family history of other specified conditions: Secondary | ICD-10-CM | POA: Diagnosis not present

## 2016-09-22 ENCOUNTER — Encounter: Payer: Self-pay | Admitting: *Deleted

## 2016-09-25 ENCOUNTER — Encounter (HOSPITAL_COMMUNITY): Payer: Self-pay | Admitting: Emergency Medicine

## 2016-09-25 ENCOUNTER — Inpatient Hospital Stay (HOSPITAL_COMMUNITY)
Admission: EM | Admit: 2016-09-25 | Discharge: 2016-09-29 | DRG: 064 | Disposition: A | Discharge status: Home / Self Care | Payer: Medicare Other | Attending: Family Medicine | Admitting: Family Medicine

## 2016-09-25 ENCOUNTER — Emergency Department (HOSPITAL_COMMUNITY): Payer: Medicare Other

## 2016-09-25 DIAGNOSIS — I749 Embolism and thrombosis of unspecified artery: Secondary | ICD-10-CM | POA: Diagnosis not present

## 2016-09-25 DIAGNOSIS — I63233 Cerebral infarction due to unspecified occlusion or stenosis of bilateral carotid arteries: Secondary | ICD-10-CM | POA: Diagnosis not present

## 2016-09-25 DIAGNOSIS — Z823 Family history of stroke: Secondary | ICD-10-CM

## 2016-09-25 DIAGNOSIS — Z818 Family history of other mental and behavioral disorders: Secondary | ICD-10-CM

## 2016-09-25 DIAGNOSIS — F329 Major depressive disorder, single episode, unspecified: Secondary | ICD-10-CM | POA: Diagnosis not present

## 2016-09-25 DIAGNOSIS — Z86711 Personal history of pulmonary embolism: Secondary | ICD-10-CM | POA: Diagnosis not present

## 2016-09-25 DIAGNOSIS — R748 Abnormal levels of other serum enzymes: Secondary | ICD-10-CM | POA: Diagnosis present

## 2016-09-25 DIAGNOSIS — I4510 Unspecified right bundle-branch block: Secondary | ICD-10-CM | POA: Diagnosis not present

## 2016-09-25 DIAGNOSIS — I2510 Atherosclerotic heart disease of native coronary artery without angina pectoris: Secondary | ICD-10-CM | POA: Diagnosis present

## 2016-09-25 DIAGNOSIS — I639 Cerebral infarction, unspecified: Secondary | ICD-10-CM | POA: Diagnosis not present

## 2016-09-25 DIAGNOSIS — K219 Gastro-esophageal reflux disease without esophagitis: Secondary | ICD-10-CM | POA: Diagnosis not present

## 2016-09-25 DIAGNOSIS — C801 Malignant (primary) neoplasm, unspecified: Secondary | ICD-10-CM

## 2016-09-25 DIAGNOSIS — R9431 Abnormal electrocardiogram [ECG] [EKG]: Secondary | ICD-10-CM | POA: Diagnosis not present

## 2016-09-25 DIAGNOSIS — Z79899 Other long term (current) drug therapy: Secondary | ICD-10-CM | POA: Diagnosis not present

## 2016-09-25 DIAGNOSIS — G4733 Obstructive sleep apnea (adult) (pediatric): Secondary | ICD-10-CM | POA: Diagnosis present

## 2016-09-25 DIAGNOSIS — Z6831 Body mass index (BMI) 31.0-31.9, adult: Secondary | ICD-10-CM | POA: Diagnosis not present

## 2016-09-25 DIAGNOSIS — R778 Other specified abnormalities of plasma proteins: Secondary | ICD-10-CM

## 2016-09-25 DIAGNOSIS — E785 Hyperlipidemia, unspecified: Secondary | ICD-10-CM | POA: Diagnosis present

## 2016-09-25 DIAGNOSIS — R4701 Aphasia: Secondary | ICD-10-CM | POA: Diagnosis not present

## 2016-09-25 DIAGNOSIS — Z833 Family history of diabetes mellitus: Secondary | ICD-10-CM | POA: Diagnosis not present

## 2016-09-25 DIAGNOSIS — R591 Generalized enlarged lymph nodes: Secondary | ICD-10-CM | POA: Diagnosis not present

## 2016-09-25 DIAGNOSIS — I6340 Cerebral infarction due to embolism of unspecified cerebral artery: Secondary | ICD-10-CM | POA: Diagnosis not present

## 2016-09-25 DIAGNOSIS — I63533 Cerebral infarction due to unspecified occlusion or stenosis of bilateral posterior cerebral arteries: Secondary | ICD-10-CM

## 2016-09-25 DIAGNOSIS — Z825 Family history of asthma and other chronic lower respiratory diseases: Secondary | ICD-10-CM | POA: Diagnosis not present

## 2016-09-25 DIAGNOSIS — R599 Enlarged lymph nodes, unspecified: Secondary | ICD-10-CM | POA: Diagnosis not present

## 2016-09-25 DIAGNOSIS — E669 Obesity, unspecified: Secondary | ICD-10-CM | POA: Diagnosis not present

## 2016-09-25 DIAGNOSIS — R410 Disorientation, unspecified: Secondary | ICD-10-CM | POA: Diagnosis not present

## 2016-09-25 DIAGNOSIS — R7989 Other specified abnormal findings of blood chemistry: Secondary | ICD-10-CM

## 2016-09-25 DIAGNOSIS — R4182 Altered mental status, unspecified: Secondary | ICD-10-CM | POA: Diagnosis not present

## 2016-09-25 DIAGNOSIS — G9340 Encephalopathy, unspecified: Secondary | ICD-10-CM | POA: Diagnosis present

## 2016-09-25 DIAGNOSIS — Z86718 Personal history of other venous thrombosis and embolism: Secondary | ICD-10-CM | POA: Diagnosis not present

## 2016-09-25 DIAGNOSIS — Z7901 Long term (current) use of anticoagulants: Secondary | ICD-10-CM | POA: Diagnosis not present

## 2016-09-25 DIAGNOSIS — R569 Unspecified convulsions: Secondary | ICD-10-CM | POA: Diagnosis not present

## 2016-09-25 DIAGNOSIS — R590 Localized enlarged lymph nodes: Secondary | ICD-10-CM | POA: Diagnosis not present

## 2016-09-25 DIAGNOSIS — I10 Essential (primary) hypertension: Secondary | ICD-10-CM | POA: Diagnosis not present

## 2016-09-25 DIAGNOSIS — Z8249 Family history of ischemic heart disease and other diseases of the circulatory system: Secondary | ICD-10-CM

## 2016-09-25 DIAGNOSIS — Z8673 Personal history of transient ischemic attack (TIA), and cerebral infarction without residual deficits: Secondary | ICD-10-CM | POA: Diagnosis present

## 2016-09-25 DIAGNOSIS — R29818 Other symptoms and signs involving the nervous system: Secondary | ICD-10-CM | POA: Diagnosis not present

## 2016-09-25 DIAGNOSIS — I824Y9 Acute embolism and thrombosis of unspecified deep veins of unspecified proximal lower extremity: Secondary | ICD-10-CM | POA: Diagnosis not present

## 2016-09-25 HISTORY — DX: Other pulmonary embolism without acute cor pulmonale: I26.99

## 2016-09-25 HISTORY — DX: Acute embolism and thrombosis of unspecified deep veins of unspecified lower extremity: I82.409

## 2016-09-25 LAB — COMPREHENSIVE METABOLIC PANEL
ALBUMIN: 4 g/dL (ref 3.5–5.0)
ALT: 25 U/L (ref 17–63)
AST: 26 U/L (ref 15–41)
Alkaline Phosphatase: 106 U/L (ref 38–126)
Anion gap: 6 (ref 5–15)
BUN: 19 mg/dL (ref 6–20)
CHLORIDE: 99 mmol/L — AB (ref 101–111)
CO2: 29 mmol/L (ref 22–32)
Calcium: 10.2 mg/dL (ref 8.9–10.3)
Creatinine, Ser: 1 mg/dL (ref 0.61–1.24)
GFR calc Af Amer: >60 mL/min (ref >60)
GFR calc non Af Amer: >60 mL/min (ref >60)
GLUCOSE: 110 mg/dL — AB (ref 65–99)
POTASSIUM: 3.8 mmol/L (ref 3.5–5.1)
SODIUM: 134 mmol/L — AB (ref 135–145)
Total Bilirubin: 0.7 mg/dL (ref 0.3–1.2)
Total Protein: 7.2 g/dL (ref 6.5–8.1)

## 2016-09-25 LAB — CBC
HCT: 38.3 % — ABNORMAL LOW (ref 39.0–52.0)
Hemoglobin: 12.9 g/dL — ABNORMAL LOW (ref 13.0–17.0)
MCH: 30.2 pg (ref 26.0–34.0)
MCHC: 33.7 g/dL (ref 30.0–36.0)
MCV: 89.7 fL (ref 78.0–100.0)
Platelets: 206 10*3/uL (ref 150–400)
RBC: 4.27 MIL/uL (ref 4.22–5.81)
RDW: 13.2 % (ref 11.5–15.5)
WBC: 6.9 10*3/uL (ref 4.0–10.5)

## 2016-09-25 LAB — DIFFERENTIAL
BASOS ABS: 0 10*3/uL (ref 0.0–0.1)
BASOS PCT: 0 %
EOS ABS: 0.2 10*3/uL (ref 0.0–0.7)
Eosinophils Relative: 2 %
Lymphocytes Relative: 21 %
Lymphs Abs: 1.4 10*3/uL (ref 0.7–4.0)
Monocytes Absolute: 0.7 10*3/uL (ref 0.1–1.0)
Monocytes Relative: 10 %
NEUTROS PCT: 67 %
Neutro Abs: 4.7 10*3/uL (ref 1.7–7.7)

## 2016-09-25 LAB — I-STAT TROPONIN, ED
TROPONIN I, POC: 1.06 ng/mL — AB (ref 0.00–0.08)
Troponin i, poc: 1.01 ng/mL (ref 0.00–0.08)

## 2016-09-25 LAB — TROPONIN I: TROPONIN I: 1.23 ng/mL — AB (ref <0.03)

## 2016-09-25 LAB — CBG MONITORING, ED: GLUCOSE-CAPILLARY: 101 mg/dL — AB (ref 65–99)

## 2016-09-25 LAB — APTT: APTT: 36 s (ref 24–36)

## 2016-09-25 LAB — PROTIME-INR
INR: 2.13
Prothrombin Time: 24.2 seconds — ABNORMAL HIGH (ref 11.4–15.2)

## 2016-09-25 MED ORDER — CARBOXYMETHYLCELLULOSE SODIUM 1 % OP SOLN: 1.0000 [drp] | Freq: Two times a day (BID) | OPHTHALMIC | Status: DC

## 2016-09-25 MED ORDER — SENNOSIDES-DOCUSATE SODIUM 8.6-50 MG PO TABS: 1.0000 | ORAL_TABLET | Freq: Every evening | ORAL | Status: DC | PRN

## 2016-09-25 MED ORDER — RIVAROXABAN 15 MG PO TABS
15.0000 mg | ORAL_TABLET | Freq: Two times a day (BID) | ORAL | Status: DC
Start: 2016-09-25 — End: 2016-09-27
  Administered 2016-09-25 – 2016-09-27 (×4): 15 mg via ORAL
  Filled 2016-09-25 (×4): qty 1

## 2016-09-25 MED ORDER — ACETAMINOPHEN 160 MG/5ML PO SOLN: 650.0000 mg | ORAL | Status: DC | PRN

## 2016-09-25 MED ORDER — ATORVASTATIN CALCIUM 40 MG PO TABS
80.0000 mg | ORAL_TABLET | Freq: Every day | ORAL | Status: DC
  Administered 2016-09-25 – 2016-09-29 (×5): 80 mg via ORAL
  Filled 2016-09-25 (×5): qty 2

## 2016-09-25 MED ORDER — ENOXAPARIN SODIUM 30 MG/0.3ML ~~LOC~~ SOLN
30.0000 mg | SUBCUTANEOUS | Status: DC
  Administered 2016-09-25: 30 mg via SUBCUTANEOUS
  Filled 2016-09-25: qty 0.3

## 2016-09-25 MED ORDER — ACETAMINOPHEN 650 MG RE SUPP: 650.0000 mg | RECTAL | Status: DC | PRN

## 2016-09-25 MED ORDER — SODIUM CHLORIDE 0.9 % IV SOLN
INTRAVENOUS | Status: DC
  Administered 2016-09-25 – 2016-09-29 (×6): via INTRAVENOUS

## 2016-09-25 MED ORDER — SODIUM CHLORIDE 0.9 % IV SOLN
Freq: Once | INTRAVENOUS | Status: AC
  Administered 2016-09-25: 16:00:00 via INTRAVENOUS

## 2016-09-25 MED ORDER — HYDRALAZINE HCL 25 MG PO TABS
25.0000 mg | ORAL_TABLET | Freq: Four times a day (QID) | ORAL | Status: DC | PRN
  Filled 2016-09-25: qty 1

## 2016-09-25 MED ORDER — ACETAMINOPHEN 325 MG PO TABS: 650.0000 mg | ORAL_TABLET | ORAL | Status: DC | PRN

## 2016-09-25 MED ORDER — POLYVINYL ALCOHOL 1.4 % OP SOLN
1.0000 [drp] | Freq: Two times a day (BID) | OPHTHALMIC | Status: DC
  Administered 2016-09-25 – 2016-09-29 (×7): 1 [drp] via OPHTHALMIC
  Filled 2016-09-25 (×3): qty 15

## 2016-09-25 MED ORDER — ONDANSETRON HCL 4 MG/2ML IJ SOLN
4.0000 mg | Freq: Once | INTRAMUSCULAR | Status: AC
  Administered 2016-09-25: 4 mg via INTRAVENOUS
  Filled 2016-09-25: qty 2

## 2016-09-25 MED ORDER — PANTOPRAZOLE SODIUM 40 MG PO TBEC
40.0000 mg | DELAYED_RELEASE_TABLET | Freq: Two times a day (BID) | ORAL | Status: DC
  Administered 2016-09-25 – 2016-09-29 (×8): 40 mg via ORAL
  Filled 2016-09-25 (×8): qty 1

## 2016-09-25 MED ORDER — ESCITALOPRAM OXALATE 10 MG PO TABS
20.0000 mg | ORAL_TABLET | Freq: Every day | ORAL | Status: DC
  Administered 2016-09-26 – 2016-09-29 (×4): 20 mg via ORAL
  Filled 2016-09-25 (×4): qty 2

## 2016-09-25 MED ORDER — STROKE: EARLY STAGES OF RECOVERY BOOK
Freq: Once | Status: AC
  Administered 2016-09-26: 12:00:00
  Filled 2016-09-25: qty 1

## 2016-09-25 NOTE — ED Notes (Addendum)
CRITICAL VALUE ALERT  Critical value received:  Troponin 1.06  Date of notification: [id number]  Time of notification: 09/25/2016    Nurse who received alert:  Susa Day  MD notified (1st page):  Dr. Tommy Rainwater

## 2016-09-25 NOTE — ED Notes (Signed)
Pt back from CT

## 2016-09-25 NOTE — ED Notes (Addendum)
EDP at bedside and lab at bedside.  Communicated with CT

## 2016-09-25 NOTE — ED Notes (Signed)
Neuro assessment completed by Dr. Reather Converse and mild delay noted when answering questions.

## 2016-09-25 NOTE — H&P (Signed)
[facility] History and Physical  [name] [id number] DOB: August 29, 1950 DOA: 09/25/2016  Referring physician: Reather Converse PCP: Fransisca Kaufmann Dettinger, MD  Specialists: none yet  Chief Complaint:   HPI:  67 y/o ? htn hld Recent admission 1/20-1/24 DVT and PE and placed on Xarelto TURP in [place] ~2010 Bipolar GERD Probable OSA Premature CAD-Stress test EF 56%, low risk study 10/23/15  Admit from home am 09/25/16 with slow speech and mild confusion-could not tell "where his room was".  Having some word-finding difficulty. Was shuffling in gait, less talkative than usual.  mosre slow to speak acc to wife Aware of where he is but much slower to repsond than normal  No cp, no n/v no unilat weakness No rash, no LE swelling. After last d/c was more mobile, had less LE pains and was ambulating well. No fever  No reported dark nor tarry stool No recent new changes to medications except from Hospital d/c     Review of Systems:   Slightly slower speech than usual per wife No CP Slight confusion can repeat "no ifs ands or buts"  Past Medical History:  Diagnosis Date  . Depression   . DVT (deep venous thrombosis) ([place])   . GERD (gastroesophageal reflux disease)   . 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 Hyperlipidemia   . Hypertension   . PE (pulmonary thromboembolism) ([place])    Past Surgical History:  Procedure Laterality Date  . adnoidectomy    . APPENDECTOMY    . PROSTATE SURGERY    . TONSILLECTOMY AND ADENOIDECTOMY     Social History:  Social History   Social History Narrative   Epworth Sleepiness Scale = 12 (as of 09/09/2015)    No Known Allergies  Family History  Problem Relation Age of Onset  . COPD Mother   . Heart disease Mother   . AAA (abdominal aortic aneurysm) Mother   . Heart disease Father   . Depression Father   . Diabetes Father   . Heart disease Brother   . Early death Brother     heart attack  . Heart disease Maternal Grandmother   .  Heart disease Maternal Grandfather   . Stroke Paternal Grandmother   . Heart disease Brother   . Diabetes Brother      Prior to Admission medications   Medication Sig Start Date End Date Taking? Authorizing Provider  amLODipine (NORVASC) 5 MG tablet Take 1 tablet (5 mg total) by mouth daily. 11/12/15   Fransisca Kaufmann Dettinger, MD  atorvastatin (LIPITOR) 80 MG tablet Take 1 tablet (80 mg total) by mouth daily. Patient taking differently: Take 80 mg by mouth at bedtime.  11/12/15   Fransisca Kaufmann Dettinger, MD  Coenzyme Q10-Vitamin E (QUNOL ULTRA COQ10 PO) Take by mouth daily.    Historical Provider, MD  Cyanocobalamin (VITAMIN B-12 PO) Take 1 tablet by mouth daily.    Historical Provider, MD  escitalopram (LEXAPRO) 20 MG tablet Take 1 tablet (20 mg total) by mouth daily. 07/25/16   Fransisca Kaufmann Dettinger, MD  Multiple Vitamin (MULTIVITAMIN WITH MINERALS) TABS tablet Take 1 tablet by mouth at bedtime.    Historical Provider, MD  omeprazole (PRILOSEC) 20 MG capsule Take 1 capsule (20 mg total) by mouth daily. 11/12/15   Fransisca Kaufmann Dettinger, MD  Polyvinyl Alcohol-Povidone (REFRESH OP) Place 1 drop into both eyes 2 (two) times daily as needed (dry eyes).    Historical Provider, MD  Pseudoephedrine HCl (SUDAFED 12 HOUR PO)  Take 1 tablet by mouth at bedtime as needed (sinus congestion).    Historical Provider, MD  rivaroxaban (XARELTO) 20 MG TABS tablet Take 1 tablet (20 mg total) by mouth daily with supper. 09/16/16   Fransisca Kaufmann Dettinger, MD  sildenafil (VIAGRA) 100 MG tablet Take 0.5-1 tablets (50-100 mg total) by mouth daily as needed for erectile dysfunction. Patient taking differently: Take 50 mg by mouth daily as needed for erectile dysfunction.  07/27/16   Mary-Margaret Hassell Done, FNP  TURMERIC PO Take 3 capsules by mouth daily.    Historical Provider, MD  valsartan-hydrochlorothiazide (DIOVAN-HCT) 320-12.5 MG tablet Take 1 tablet by mouth daily. 11/12/15   Fransisca Kaufmann Dettinger, MD   Physical Exam: Vitals:   09/25/16  1340 09/25/16 1351 09/25/16 1400 09/25/16 1430  BP: 133/71  128/62 119/59  Pulse: 77  73 66  Resp: 17  22 17   Temp: 98.4 F (36.9 C)     TempSrc: Oral     SpO2: 96%  95% 91%  Weight:  106.6 kg (235 lb)    Height:  6\' 1"  (1.854 m)       General:  Flat affect, no ict nor pallor.  Tongue protrudes midline.  No twsiting.  Uvula midline  Eyes:  EOMI, however on the R temporal area poor temporal filed vision  ENT:  mallampati 2, no thryomegally  Neck:  Soft, no bruit, no jvd  Cardiovascular:  s1 s2 no m/r/g  Respiratory: clear no added sound  Abdomen:  Soft nt nd no rebound no gaurd  Skin:  Intact, no le edema  Musculoskeletal:  intact  Psychiatric:  flat  Neurologic: moving 4 limbs =. Sensory grossly intact.  Power 5/5.  Reflexes 2/3.  Finger nose finger wnl.  Vision as above.    Labs on Admission:  Basic Metabolic Panel:  Recent Labs Lab 09/25/16 1342  NA 134*  K 3.8  CL 99*  CO2 29  GLUCOSE 110*  BUN 19  CREATININE 1.00  CALCIUM 10.2   Liver Function Tests:  Recent Labs Lab 09/25/16 1342  AST 26  ALT 25  ALKPHOS 106  BILITOT 0.7  PROT 7.2  ALBUMIN 4.0   No results for input(s): LIPASE, AMYLASE in the last 168 hours. No results for input(s): AMMONIA in the last 168 hours. CBC:  Recent Labs Lab 09/25/16 1342  WBC 6.9  NEUTROABS 4.7  HGB 12.9*  HCT 38.3*  MCV 89.7  PLT 206   Cardiac Enzymes: No results for input(s): CKTOTAL, CKMB, CKMBINDEX, TROPONINI in the last 168 hours.  BNP (last 3 results) No results for input(s): BNP in the last 8760 hours.  ProBNP (last 3 results) No results for input(s): PROBNP in the last 8760 hours.  CBG:  Recent Labs Lab 09/25/16 1341  GLUCAP 101*    Radiological Exams on Admission: Ct Head Code Stroke Wo Contrast`  Result Date: 09/25/2016 CLINICAL DATA:  Code stroke.  Confusion and dysphagia since 10 a.m. EXAM: CT HEAD WITHOUT CONTRAST TECHNIQUE: Contiguous axial images were obtained from the base  of the skull through the vertex without intravenous contrast. COMPARISON:  None. FINDINGS: Brain: No evidence of acute infarction, hemorrhage, hydrocephalus, extra-axial collection or mass lesion/mass effect. Vascular: Atherosclerotic calcification. Skull: No acute or aggressive finding. Mild enlargement of biparietal foramina. Sinuses/Orbits: Negative Other: These results were called by telephone at the time of interpretation on 09/25/2016 at 2:05 pm to Dr. Reather Converse, who verbally acknowledged these results. ASPECTS [facility] Stroke Program Early CT Score) - Ganglionic level  infarction (caudate, lentiform nuclei, internal capsule, insula, M1-M3 cortex): 7 - Supraganglionic infarction (M4-M6 cortex): 3 Total score (0-10 with 10 being normal): 10 IMPRESSION: No acute finding. ASPECTS is 10. Electronically Signed   By: Monte Fantasia M.D.   On: 09/25/2016 14:06    EKG: Independently reviewed. Sinus rythmn  Assessment/Plan   1. Possible CVA-CT head neg.  Already on [facility].  Not candidate for TPA as unclear when last known WNL.  Might need higher dosing aspirin or addition plavix to the same in additio to lifelong xarelto on d/c.  Consulting Neurology in am for input rE: AC etc. Etc. 2. Elevated troponin without injury pattern on EKG-rpt I-stat troponin is +. EKG however com-pletely benign.  He has no cp.  Would cycle normal troponins and monitor. 3. Recent LLE DVt/ bilateral PE 09/11/15-continue Elliquis.  As part of work-up, Echo done last admit showed no specfic mention of PFO.  Will repeat the same and reasses 4. Chronic diastolic dysfunction-based on echo 1/22-no acute symptoms 5. GERD-protonix 40 bid 6. htn-hold amlodipine and Valsartan HCTZ.  Needs permissive htn if actual CVA 7. Depression-cont lexapro   Full code Discussed with wife at the bedside  In-patient tele at [facility]  Time spent: [place], [facility] Pager [phone]  If 7PM-7AM, please contact  night-coverage [email or web address] Password [email or web address] 09/25/2016, 2:45 PM

## 2016-09-25 NOTE — ED Notes (Signed)
Paged code stroke vial telephone, due to issues with [employer] Chrome not opening up, at 13:47 Called SOC at 13:48

## 2016-09-25 NOTE — ED Triage Notes (Signed)
Per wife patient had sudden onset of cofunsion with asphasia and c/o pain behind left eye. Per wife patient last normal at 77. Patient alert and oriented at this time. Denies any pain. Per wife recently diagnosed with "blood clots."

## 2016-09-25 NOTE — ED Notes (Signed)
Suspected stroke last seen normal around 1100 am.

## 2016-09-25 NOTE — ED Notes (Signed)
Cancelled troponin that was due as a repeat.  I-stat troponin done at 1544.  Result 1.06.

## 2016-09-25 NOTE — ED Provider Notes (Signed)
[facility] DEPT Provider Note   CSN: [id number] Arrival date & time: 09/25/16  1336   By signing my name below, I, Jaquelyn Bitter., attest that this documentation has been prepared under the direction and in the presence of Elnora Morrison, MD. Electronically signed: Jaquelyn Bitter., ED Scribe. 09/25/16. 3:10 PM.   History   Chief Complaint Chief Complaint  Patient presents with  . Code Stroke    HPI  [name] is a 66 y.o. male with hx of 25 blood clots, hyperlipidemia, HTN who presents to the Emergency Department with a code stroke. Per wifie, pt had a similar episode x2 weeks ago with leg swelling, chest pain and was admitted for 25 blood clots in bilateral lungs, legs and shoulder. Pt was seen here this week for an aorta test and is scheduled for hematology in 2 months. Yesterday wife notes that pt did not move and was complaining of pain behind L eye. She states that today pt has been very confused. Last normal was between 12-1PM. Pt reportedly takes 81mg  aspirin daily for the past 20 years. Pt reports aphasia, confusion. He denies vision loss, blurred vision, double vision. He denies hx of stroke. Of note, pt takes Xarelto for blood clots. Pt reports family hx of blood clots (mother.)   The history is provided by the patient and the spouse. No language interpreter was used.    Past Medical History:  Diagnosis Date  . Depression   . DVT (deep venous thrombosis) ([place])   . GERD (gastroesophageal reflux disease)   . 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 Hyperlipidemia   . Hypertension   . PE (pulmonary thromboembolism) [facility])     Patient Active Problem List   Diagnosis Date Noted  . Pulmonary emboli ([place]) 09/10/2016  . Acute DVT (deep venous thrombosis) ([place]) 09/10/2016  . Hyponatremia 09/10/2016  . Normocytic anemia 09/10/2016  . Acute pulmonary embolism ([place]) 09/10/2016  . Essential hypertension, benign 06/10/2015  . Depression 06/10/2015  .  Hyperlipidemia LDL goal <130 06/10/2015  . GERD (gastroesophageal reflux disease) 06/10/2015    Past Surgical History:  Procedure Laterality Date  . adnoidectomy    . APPENDECTOMY    . PROSTATE SURGERY    . TONSILLECTOMY AND ADENOIDECTOMY         Home Medications    Prior to Admission medications   Medication Sig Start Date End Date Taking? Authorizing Provider  amLODipine (NORVASC) 5 MG tablet Take 1 tablet (5 mg total) by mouth daily. 11/12/15  Yes Fransisca Kaufmann Dettinger, MD  atorvastatin (LIPITOR) 80 MG tablet Take 1 tablet (80 mg total) by mouth daily. Patient taking differently: Take 80 mg by mouth at bedtime.  11/12/15  Yes Fransisca Kaufmann Dettinger, MD  Coenzyme Q10-Vitamin E (QUNOL ULTRA COQ10 PO) Take by mouth daily.   Yes Historical Provider, MD  Cyanocobalamin (VITAMIN B-12 PO) Take 1 tablet by mouth daily.   Yes Historical Provider, MD  escitalopram (LEXAPRO) 20 MG tablet Take 1 tablet (20 mg total) by mouth daily. 07/25/16  Yes Fransisca Kaufmann Dettinger, MD  Multiple Vitamin (MULTIVITAMIN WITH MINERALS) TABS tablet Take 1 tablet by mouth at bedtime.   Yes Historical Provider, MD  omeprazole (PRILOSEC) 20 MG capsule Take 1 capsule (20 mg total) by mouth daily. 11/12/15  Yes Fransisca Kaufmann Dettinger, MD  Polyvinyl Alcohol-Povidone (REFRESH OP) Place 1 drop into both eyes 2 (two) times daily as needed (dry eyes).   Yes Historical Provider,  MD  Pseudoephedrine HCl (SUDAFED 12 HOUR PO) Take 1 tablet by mouth at bedtime as needed (sinus congestion).   Yes Historical Provider, MD  rivaroxaban (XARELTO) 20 MG TABS tablet Take 1 tablet (20 mg total) by mouth daily with supper. Patient taking differently: Take 15 mg by mouth 2 (two) times daily.  09/16/16  Yes Fransisca Kaufmann Dettinger, MD  sildenafil (VIAGRA) 100 MG tablet Take 0.5-1 tablets (50-100 mg total) by mouth daily as needed for erectile dysfunction. Patient taking differently: Take 50 mg by mouth daily as needed for erectile dysfunction.  07/27/16  Yes  Mary-Margaret Hassell Done, FNP  TURMERIC PO Take 3 capsules by mouth daily.   Yes Historical Provider, MD  valsartan-hydrochlorothiazide (DIOVAN-HCT) 320-12.5 MG tablet Take 1 tablet by mouth daily. 11/12/15  Yes Fransisca Kaufmann Dettinger, MD    Family History Family History  Problem Relation Age of Onset  . COPD Mother   . Heart disease Mother   . AAA (abdominal aortic aneurysm) Mother   . Heart disease Father   . Depression Father   . Diabetes Father   . Heart disease Brother   . Early death Brother     heart attack  . Heart disease Maternal Grandmother   . Heart disease Maternal Grandfather   . Stroke Paternal Grandmother   . Heart disease Brother   . Diabetes Brother     Social History Social History  Substance Use Topics  . Smoking status: Never Smoker  . Smokeless tobacco: Never Used  . Alcohol use 0.0 oz/week     Comment: occasional     Allergies   Patient has no known allergies.   Review of Systems Review of Systems  Unable to perform ROS: Acuity of condition  Eyes: Negative for visual disturbance.  Neurological: Positive for speech difficulty.  Psychiatric/Behavioral: Positive for confusion.     Physical Exam Updated Vital Signs BP 119/59   Pulse 66   Temp 98.4 F (36.9 C) (Oral)   Resp 17   Ht 6\' 1"  (1.854 m)   Wt 235 lb (106.6 kg)   SpO2 91%   BMI 31.00 kg/m   Physical Exam  Constitutional: He appears well-developed and well-nourished.  HENT:  Head: Normocephalic and atraumatic.  Eyes: Conjunctivae and EOM are normal. Pupils are equal, round, and reactive to light.  Neck: Neck supple.  Cardiovascular: Normal rate and regular rhythm.   No murmur heard. Pulmonary/Chest: Effort normal and breath sounds normal. No respiratory distress.  Abdominal: Soft. There is no tenderness.  Musculoskeletal: He exhibits no edema.  Neurological: He is alert. A cranial nerve deficit is present. GCS eye subscore is 4. GCS verbal subscore is 4. GCS motor subscore is 6.    5+ strength in UE and LE with f/e at major joints. Sensation to palpation intact in UE and LE.  EOMFI.  PERRL.   Finger nose and coordination intact bilateral.   No nystagmus Mild confusion global and expressive aphasia Mild delay in answering questions.    Skin: Skin is warm and dry.  Psychiatric: He has a normal mood and affect.  Nursing note and vitals reviewed.    ED Treatments / Results   DIAGNOSTIC STUDIES: Oxygen Saturation is normal on my interpretation.   COORDINATION OF CARE: 3:10 PM-Discussed next steps with pt. Pt verbalized understanding and is agreeable with the plan.    Labs (all labs ordered are listed, but only abnormal results are displayed) Labs Reviewed  PROTIME-INR - Abnormal; Notable for the following:  Result Value   Prothrombin Time 24.2 (*)    All other components within normal limits  CBC - Abnormal; Notable for the following:    Hemoglobin 12.9 (*)    HCT 38.3 (*)    All other components within normal limits  COMPREHENSIVE METABOLIC PANEL - Abnormal; Notable for the following:    Sodium 134 (*)    Chloride 99 (*)    Glucose, Bld 110 (*)    All other components within normal limits  CBG MONITORING, ED - Abnormal; Notable for the following:    Glucose-Capillary 101 (*)    All other components within normal limits  I-STAT TROPOININ, ED - Abnormal; Notable for the following:    Troponin i, poc 1.01 (*)    All other components within normal limits  APTT  DIFFERENTIAL  URINALYSIS, ROUTINE W REFLEX MICROSCOPIC  TROPONIN I  CBG MONITORING, ED  I-STAT CHEM 8, ED    EKG  EKG Interpretation  Date/Time:  Sunday September 25 2016 14:16:14 EST Ventricular Rate:  71 PR Interval:    QRS Duration: 130 QT Interval:  385 QTC Calculation: 419 R Axis:   -40 Text Interpretation:  Sinus rhythm RBBB and LAFB Confirmed by Reather Converse MD, Vonna Kotyk [id number]) on 09/25/2016 2:20:52 PM Also confirmed by Reather Converse MD, Ilario Dhaliwal [phone])  on 09/25/2016 2:32:36 PM        Radiology Ct Head Code Stroke Wo Contrast`  Result Date: 09/25/2016 CLINICAL DATA:  Code stroke.  Confusion and dysphagia since 10 a.m. EXAM: CT HEAD WITHOUT CONTRAST TECHNIQUE: Contiguous axial images were obtained from the base of the skull through the vertex without intravenous contrast. COMPARISON:  None. FINDINGS: Brain: No evidence of acute infarction, hemorrhage, hydrocephalus, extra-axial collection or mass lesion/mass effect. Vascular: Atherosclerotic calcification. Skull: No acute or aggressive finding. Mild enlargement of biparietal foramina. Sinuses/Orbits: Negative Other: These results were called by telephone at the time of interpretation on 09/25/2016 at 2:05 pm to Dr. Reather Converse, who verbally acknowledged these results. ASPECTS [facility] Stroke Program Early CT Score) - Ganglionic level infarction (caudate, lentiform nuclei, internal capsule, insula, M1-M3 cortex): 7 - Supraganglionic infarction (M4-M6 cortex): 3 Total score (0-10 with 10 being normal): 10 IMPRESSION: No acute finding. ASPECTS is 10. Electronically Signed   By: Monte Fantasia M.D.   On: 09/25/2016 14:06    Procedures Procedures (including critical care time) CRITICAL CARE Performed by: Mariea Clonts   Total critical care time: 35 minutes  Critical care time was exclusive of separately billable procedures and treating other patients.  Critical care was necessary to treat or prevent imminent or life-threatening deterioration.  Critical care was time spent personally by me on the following activities: development of treatment plan with patient and/or surrogate as well as nursing, discussions with consultants, evaluation of patient's response to treatment, examination of patient, obtaining history from patient or surrogate, ordering and performing treatments and interventions, ordering and review of laboratory studies, ordering and review of radiographic studies, pulse oximetry and re-evaluation of patient's  condition.  Medications Ordered in ED Medications  0.9 %  sodium chloride infusion (not administered)     Initial Impression / Assessment and Plan / ED Course  I have reviewed the triage vital signs and the nursing notes.  Pertinent labs & imaging results that were available during my care of the patient were reviewed by me and considered in my medical decision making (see chart for details).   Code stroke with mild expressive aphasia/ confusion. CT head no bleed, pt on  anticoagulants. Blood work, UA, plan for tele obs. Discussed with stroke neuro on the phone, no indication for TPA.   Troponin elevated, no CP today.    The patients results and plan were reviewed and discussed.   Any x-rays performed were independently reviewed by myself.   Differential diagnosis were considered with the presenting HPI.  Medications  0.9 %  sodium chloride infusion (not administered)    Vitals:   09/25/16 1340 09/25/16 1351 09/25/16 1400 09/25/16 1430  BP: 133/71  128/62 119/59  Pulse: 77  73 66  Resp: 17  22 17   Temp: 98.4 F (36.9 C)     TempSrc: Oral     SpO2: 96%  95% 91%  Weight:  235 lb (106.6 kg)    Height:  6\' 1"  (1.854 m)      Final diagnoses:  Expressive aphasia  Acute encephalopathy  Troponin level elevated    Admission/ observation were discussed with the admitting physician, patient and/or family and they are comfortable with the plan.    Final Clinical Impressions(s) / ED Diagnoses   Final diagnoses:  Expressive aphasia  Acute encephalopathy  Troponin level elevated    New Prescriptions New Prescriptions   No medications on file        Elnora Morrison, MD 09/25/16 1510

## 2016-09-25 NOTE — ED Notes (Signed)
Pt gone to CT 

## 2016-09-25 NOTE — Progress Notes (Signed)
CODE STROKE 13:35  Beeper & phone call 13:43  Exam started 13:46  Exam finished 13:52 Images sent to SOC,exam completed in Epic 13:53 [facility] Radiology called spoke with Zar.  Note : wrong type ct head ordered, order had to be changed to Code STroke Head.

## 2016-09-25 NOTE — ED Notes (Signed)
Tele neuro assessment

## 2016-09-26 ENCOUNTER — Inpatient Hospital Stay (HOSPITAL_COMMUNITY): Payer: Medicare Other

## 2016-09-26 DIAGNOSIS — R569 Unspecified convulsions: Secondary | ICD-10-CM | POA: Diagnosis not present

## 2016-09-26 DIAGNOSIS — R4701 Aphasia: Secondary | ICD-10-CM | POA: Diagnosis not present

## 2016-09-26 DIAGNOSIS — R4182 Altered mental status, unspecified: Secondary | ICD-10-CM | POA: Diagnosis not present

## 2016-09-26 DIAGNOSIS — R9431 Abnormal electrocardiogram [ECG] [EKG]: Secondary | ICD-10-CM

## 2016-09-26 LAB — LIPID PANEL
Cholesterol: 95 mg/dL (ref 0–200)
HDL: 29 mg/dL — ABNORMAL LOW (ref >40)
LDL CALC: 49 mg/dL (ref 0–99)
Total CHOL/HDL Ratio: 3.3 RATIO
Triglycerides: 86 mg/dL (ref <150)
VLDL: 17 mg/dL (ref 0–40)

## 2016-09-26 LAB — ECHOCARDIOGRAM COMPLETE
HEIGHTINCHES: 73 in
WEIGHTICAEL: 3760 [oz_av]

## 2016-09-26 LAB — TROPONIN I: TROPONIN I: 1.29 ng/mL — AB (ref <0.03)

## 2016-09-26 MED ORDER — RIVAROXABAN 20 MG PO TABS: 20.0000 mg | ORAL_TABLET | Freq: Every day | ORAL | Status: DC

## 2016-09-26 MED ORDER — ASPIRIN EC 81 MG PO TBEC
81.0000 mg | DELAYED_RELEASE_TABLET | Freq: Every day | ORAL | Status: DC
  Administered 2016-09-26 – 2016-09-29 (×4): 81 mg via ORAL
  Filled 2016-09-26 (×4): qty 1

## 2016-09-26 NOTE — Consult Note (Signed)
[place] A. [name], MD     [email or web address]          [name] is an 66 y.o. male.   ASSESSMENT/PLAN: The patient's neurological evaluation seem consistent with a left hemispheric dysfunction. Given the acute presentation of stroke is most likely although other left hemisphere lesions could be the etiology including seizures. The patient already is on anticoagulation. I think we'll continue with this for now and assess things as the workup continues.   The patient is a 66 year old white male who presents with acute onset of confusion and word finding difficulties. The history is very difficult as the patient is quite confused and disoriented. 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 Initial head CT scan showed no acute changes. Review of systems is limited given the marked confusion.  GENERAL: This is a pleasant obese man in no acute distress.  HEENT: Supple. Atraumatic normocephalic.   ABDOMEN: soft  EXTREMITIES: No edema   BACK: Normal.  SKIN: Normal by inspection.    MENTAL STATUS: He is awake and alert but disoriented. There appears to be significant difficulties with comprehension and speech fluency. Naming is severely impaired and in fact he was not able to name any of the 5 objects tested. There are significant word finding problems. The patient did say 1 or 2 words with testing orientation. He does follow commands.  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  MOTOR: Normal tone, bulk and strength; no pronator drift. There are no rest of the legs or upper  extremities.  COORDINATION: There is no clear evidence of dysmetria, No rest tremor; no intention tremor; no postural tremor; no bradykinesia.  REFLEXES: Deep tendon reflexes are symmetrical and normal. Babinski reflexes are flexor bilaterally.   SENSATION: Normal to light touch. However, the patient seemed to extinguish on double simultaneous simulation on the right and left leg and then on the right upper extremity which is somewhat confusing.     NIH stroke scale 2, 2, 2, 2 = 8.     Blood pressure 126/72, pulse 72, temperature 98.1 F (36.7 C), temperature source Oral, resp. rate 18, height _0  (1.854 m), weight 235 lb (106.6 kg), SpO2 95 %.  Past Medical History:  Diagnosis Date  . Depression   . DVT (deep venous thrombosis) ([place])   . GERD (gastroesophageal reflux disease)   . 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 Hyperlipidemia   . Hypertension   . PE (pulmonary thromboembolism) ([place])     Past Surgical History:  Procedure Laterality Date  . adnoidectomy    . APPENDECTOMY    . PROSTATE SURGERY    . TONSILLECTOMY AND ADENOIDECTOMY      Family History  Problem Relation Age of Onset  . COPD Mother   . Heart disease Mother   . AAA (abdominal aortic aneurysm) Mother   . Heart disease Father   . Depression Father   . Diabetes Father   . Heart disease Brother   . Early death Brother     heart attack  . Heart disease Maternal Grandmother   . Heart disease  Maternal Grandfather   . Stroke Paternal Grandmother   . Heart disease Brother   . Diabetes Brother     Social History:  reports that he has never smoked. He has never used smokeless tobacco. He reports that he drinks alcohol. He reports that he does not use drugs.  Allergies: No Known Allergies  Medications: Prior to Admission medications   Medication Sig Start Date End Date Taking? Authorizing Provider  amLODipine (NORVASC) 5 MG tablet Take 1 tablet (5 mg total) by mouth daily. 11/12/15  Yes Fransisca Kaufmann  Dettinger, MD  atorvastatin (LIPITOR) 80 MG tablet Take 1 tablet (80 mg total) by mouth daily. Patient taking differently: Take 80 mg by mouth at bedtime.  11/12/15  Yes Fransisca Kaufmann Dettinger, MD  Coenzyme Q10-Vitamin E (QUNOL ULTRA COQ10 PO) Take by mouth daily.   Yes Historical Provider, MD  Cyanocobalamin (VITAMIN B-12 PO) Take 1 tablet by mouth daily.   Yes Historical Provider, MD  escitalopram (LEXAPRO) 20 MG tablet Take 1 tablet (20 mg total) by mouth daily. 07/25/16  Yes Fransisca Kaufmann Dettinger, MD  Multiple Vitamin (MULTIVITAMIN WITH MINERALS) TABS tablet Take 1 tablet by mouth at bedtime.   Yes Historical Provider, MD  omeprazole (PRILOSEC) 20 MG capsule Take 1 capsule (20 mg total) by mouth daily. 11/12/15  Yes Fransisca Kaufmann Dettinger, MD  Polyvinyl Alcohol-Povidone (REFRESH OP) Place 1 drop into both eyes 2 (two) times daily as needed (dry eyes).   Yes Historical Provider, MD  Pseudoephedrine HCl (SUDAFED 12 HOUR PO) Take 1 tablet by mouth at bedtime as needed (sinus congestion).   Yes Historical Provider, MD  rivaroxaban (XARELTO) 20 MG TABS tablet Take 1 tablet (20 mg total) by mouth daily with supper. Patient taking differently: Take 15 mg by mouth 2 (two) times daily.  09/16/16  Yes Fransisca Kaufmann Dettinger, MD  sildenafil (VIAGRA) 100 MG tablet Take 0.5-1 tablets (50-100 mg total) by mouth daily as needed for erectile dysfunction. Patient taking differently: Take 50 mg by mouth daily as needed for erectile dysfunction.  07/27/16  Yes Mary-Margaret Hassell Done, FNP  TURMERIC PO Take 3 capsules by mouth daily.   Yes Historical Provider, MD  valsartan-hydrochlorothiazide (DIOVAN-HCT) 320-12.5 MG tablet Take 1 tablet by mouth daily. 11/12/15  Yes Fransisca Kaufmann Dettinger, MD    Scheduled Meds: .  stroke: mapping our early stages of recovery book   Does not apply Once  . atorvastatin  80 mg Oral Daily  . escitalopram  20 mg Oral Daily  . pantoprazole  40 mg Oral BID  . polyvinyl alcohol  1 drop Both Eyes BID  .  rivaroxaban  15 mg Oral BID   Continuous Infusions: . sodium chloride 75 mL/hr at 09/26/16 0531   PRN Meds:.acetaminophen **OR** acetaminophen (TYLENOL) oral liquid 160 mg/5 mL **OR** acetaminophen, hydrALAZINE, senna-docusate     Results for orders placed or performed during the hospital encounter of 09/25/16 (from the past 48 hour(s))  CBG monitoring, ED     Status: Abnormal   Collection Time: 09/25/16  1:41 PM  Result Value Ref Range   Glucose-Capillary 101 (H) 65 - 99 mg/dL  Protime-INR     Status: Abnormal   Collection Time: 09/25/16  1:42 PM  Result Value Ref Range   Prothrombin Time 24.2 (H) 11.4 - 15.2 seconds   INR 2.13   APTT     Status: None   Collection Time: 09/25/16  1:42 PM  Result Value Ref Range   aPTT 36 24 -  36 seconds  CBC     Status: Abnormal   Collection Time: 09/25/16  1:42 PM  Result Value Ref Range   WBC 6.9 4.0 - 10.5 K/uL   RBC 4.27 4.22 - 5.81 MIL/uL   Hemoglobin 12.9 (L) 13.0 - 17.0 g/dL   HCT 38.3 (L) 39.0 - 52.0 %   MCV 89.7 78.0 - 100.0 fL   MCH 30.2 26.0 - 34.0 pg   MCHC 33.7 30.0 - 36.0 g/dL   RDW 13.2 11.5 - 15.5 %   Platelets 206 150 - 400 K/uL  Differential     Status: None   Collection Time: 09/25/16  1:42 PM  Result Value Ref Range   Neutrophils Relative % 67 %   Neutro Abs 4.7 1.7 - 7.7 K/uL   Lymphocytes Relative 21 %   Lymphs Abs 1.4 0.7 - 4.0 K/uL   Monocytes Relative 10 %   Monocytes Absolute 0.7 0.1 - 1.0 K/uL   Eosinophils Relative 2 %   Eosinophils Absolute 0.2 0.0 - 0.7 K/uL   Basophils Relative 0 %   Basophils Absolute 0.0 0.0 - 0.1 K/uL  Comprehensive metabolic panel     Status: Abnormal   Collection Time: 09/25/16  1:42 PM  Result Value Ref Range   Sodium 134 (L) 135 - 145 mmol/L   Potassium 3.8 3.5 - 5.1 mmol/L   Chloride 99 (L) 101 - 111 mmol/L   CO2 29 22 - 32 mmol/L   Glucose, Bld 110 (H) 65 - 99 mg/dL   BUN 19 6 - 20 mg/dL   Creatinine, Ser 1.00 0.61 - 1.24 mg/dL   Calcium 10.2 8.9 - 10.3 mg/dL   Total  Protein 7.2 6.5 - 8.1 g/dL   Albumin 4.0 3.5 - 5.0 g/dL   AST 26 15 - 41 U/L   ALT 25 17 - 63 U/L   Alkaline Phosphatase 106 38 - 126 U/L   Total Bilirubin 0.7 0.3 - 1.2 mg/dL   GFR calc non Af Amer >60 >60 mL/min   GFR calc Af Amer >60 >60 mL/min    Comment: (NOTE) The eGFR has been calculated using the CKD EPI equation. This calculation has not been validated in all clinical situations. eGFR's persistently <60 mL/min signify possible Chronic Kidney Disease.    Anion gap 6 5 - 15  I-stat troponin, ED     Status: Abnormal   Collection Time: 09/25/16  2:19 PM  Result Value Ref Range   Troponin i, poc 1.01 (HH) 0.00 - 0.08 ng/mL   Comment NOTIFIED PHYSICIAN    Comment 3            Comment: Due to the release kinetics of cTnI, a negative result within the first hours of the onset of symptoms does not rule out myocardial infarction with certainty. If myocardial infarction is still suspected, repeat the test at appropriate intervals.   I-Stat Troponin, ED (not at [facility])     Status: Abnormal   Collection Time: 09/25/16  3:23 PM  Result Value Ref Range   Troponin i, poc 1.06 (HH) 0.00 - 0.08 ng/mL   Comment NOTIFIED PHYSICIAN    Comment 3            Comment: Due to the release kinetics of cTnI, a negative result within the first hours of the onset of symptoms does not rule out myocardial infarction with certainty. If myocardial infarction is still suspected, repeat the test at appropriate intervals.   Troponin I (q 6hr  x 3)     Status: Abnormal   Collection Time: 09/25/16  9:41 PM  Result Value Ref Range   Troponin I 1.23 (HH) <0.03 ng/mL    Comment: CRITICAL RESULT CALLED TO, READ BACK BY AND VERIFIED WITH: STOCUM,B. AT 2305 ON 09/25/2016 BY EVA   Troponin I (q 6hr x 3)     Status: Abnormal   Collection Time: 09/26/16  4:17 AM  Result Value Ref Range   Troponin I 1.29 (HH) <0.03 ng/mL    Comment: CRITICAL RESULT CALLED TO, READ BACK BY AND VERIFIED WITH: STOCUM,B AT  6:55AM ON 09/26/16 BY FESTERMAN,C   Lipid panel     Status: Abnormal   Collection Time: 09/26/16  4:17 AM  Result Value Ref Range   Cholesterol 95 0 - 200 mg/dL   Triglycerides 86 <150 mg/dL   HDL 29 (L) >40 mg/dL   Total CHOL/HDL Ratio 3.3 RATIO   VLDL 17 0 - 40 mg/dL   LDL Cholesterol 49 0 - 99 mg/dL    Comment:        Total Cholesterol/HDL:CHD Risk Coronary Heart Disease Risk Table                     Men   Women  1/2 Average Risk   3.4   3.3  Average Risk       5.0   4.4  2 X Average Risk   9.6   7.1  3 X Average Risk  23.4   11.0        Use the calculated Patient Ratio above and the CHD Risk Table to determine the patient's CHD Risk.        ATP III CLASSIFICATION (LDL):  <100     mg/dL   Optimal  100-129  mg/dL   Near or Above                    Optimal  130-159  mg/dL   Borderline  160-189  mg/dL   High  >190     mg/dL   Very High     Studies/Results:     Jurnei Latini A. [name], M.D.  Diplomate, Tax adviser of Psychiatry and Neurology ( Neurology). 09/26/2016, 9:22 AM

## 2016-09-26 NOTE — Progress Notes (Signed)
[name] [id number] DOB: [birth year] DOA: 09/25/2016 PCP: Fransisca Kaufmann Dettinger, MD  Brief narrative:  66 y/o ? htn hld Recent admission 1/20-1/24 DVT and PE and placed on Xarelto TURP in [place] ~2010 Bipolar GERD Probable OSA Premature CAD-Stress test EF 56%, low risk study 10/23/15  Admit from home am 09/25/16 with slow speech and mild confusion-could not tell "where his room was".  Having some word-finding difficulty.  Was shuffling in gait, less talkative than usual.  more slow to speak acc to wife Aware of where he is but much slower to repsond than normal  CT head neg MRI brain confirmed multiple areas of infarct-? Embolic source  Past medical history-As per Problem list Chart reviewed as below-   Consultants:  neurology  Procedures:  Mri  Echo   carotid  Antibiotics:  none   Subjective   Still confused Some confabulating of words Cannot follow 3-step command Some dysnomia as well   Objective    Interim History:   Telemetry: Benign-nsr   Objective: Vitals:   09/26/16 0134 09/26/16 0334 09/26/16 0534 09/26/16 1034  BP: 129/64 (!) 120/59 126/72 134/66  Pulse: 74 72 72 77  Resp: 16 18 18 18   Temp: 97.6 F (36.4 C) 98.2 F (36.8 C) 98.1 F (36.7 C) 98.2 F (36.8 C)  TempSrc: Oral Oral Oral   SpO2: 98% 97% 95% 96%  Weight:      Height:        Intake/Output Summary (Last 24 hours) at 09/26/16 1050 Last data filed at 09/25/16 1800  Gross per 24 hour  Intake            13.75 ml  Output                0 ml  Net            13.75 ml    Exam:  General: eomi ncat Cardiovascular:  s1 s2 no m/r/g Respiratory: clear no adde dsoudn Abdomen:  Soft nt nd no rebound no gaurd Skin no le edema Neuro confused-can comprehend but cannot verbalize back further concerns and although no word-salad word-finsding deficit exists  Data Reviewed: Basic Metabolic Panel:  Recent Labs Lab 09/25/16 1342  NA 134*  K 3.8  CL 99*  CO2 29  GLUCOSE  110*  BUN 19  CREATININE 1.00  CALCIUM 10.2   Liver Function Tests:  Recent Labs Lab 09/25/16 1342  AST 26  ALT 25  ALKPHOS 106  BILITOT 0.7  PROT 7.2  ALBUMIN 4.0   No results for input(s): LIPASE, AMYLASE in the last 168 hours. No results for input(s): AMMONIA in the last 168 hours. CBC:  Recent Labs Lab 09/25/16 1342  WBC 6.9  NEUTROABS 4.7  HGB 12.9*  HCT 38.3*  MCV 89.7  PLT 206   Cardiac Enzymes:  Recent Labs Lab 09/25/16 2141 09/26/16 0417  TROPONINI 1.23* 1.29*   BNP: Invalid input(s): POCBNP CBG:  Recent Labs Lab 09/25/16 1341  GLUCAP 101*    No results found for this or any previous visit (from the past 240 hour(s)).   Studies:              All Imaging reviewed and is as per above notation   Scheduled Meds: .  stroke: mapping our early stages of recovery book   Does not apply Once  . atorvastatin  80 mg Oral Daily  . escitalopram  20 mg Oral Daily  . pantoprazole  40 mg Oral BID  .  polyvinyl alcohol  1 drop Both Eyes BID  . rivaroxaban  15 mg Oral BID  . [START ON 10/05/2016] rivaroxaban  20 mg Oral Daily   Continuous Infusions: . 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  Already on [facility].  Not candidate on admit TPA.  AC and Anti-plt as per neurology-appreciate input.  Might require TEE for cryptogenic source?[name] Kitchen  await ECHO-asked Echo tech to do saline agitation 2. Elevated troponin without injury pattern on EKG-rpt I-stat troponin is +. EKG however completely benign.  He has no cp.  no current further work-up needed 3. Recent LLE DVt/ bilateral PE 09/11/15-continue Elliquis.  As part of work-up, Echo done last admit showed no specfic mention of PFO.  Will repeat the same and reasses 4. Chronic diastolic dysfunction-based on echo 1/22-no acute symptoms 5. GERD-protonix 40 bid 6. htn-hold amlodipine and Valsartan HCTZ.  Needs permissive htn if actual  CVA 7. Depression-cont lexapro  >30 min 15 min discussion with wife Inpatient  Full code  pending resolution will need at least 48 hours more in hopsital  Verneita Griffes, MD  [facility] Pager [phone] 09/26/2016, 10:50 AM    LOS: 1 day

## 2016-09-26 NOTE — Progress Notes (Signed)
*  PRELIMINARY RESULTS* Echocardiogram 2D Echocardiogram has been performed.  [name] 09/26/2016, 12:30 PM

## 2016-09-26 NOTE — Progress Notes (Signed)
OT Cancellation Note  Patient Details Name: [name] MRN: [id number] DOB: 1951/08/01   Cancelled Treatment:    Reason Eval/Treat Not Completed: Medical issues which prohibited therapy. Patient held this AM due to elevated Troponin levels. OT evaluation will be completed at a later time when appropriate.    Ailene Ravel, OTR/L,CBIS  [phone]  09/26/2016, 8:37 AM

## 2016-09-26 NOTE — Progress Notes (Signed)
Unable to do neurochecks and VS at this time due to patient off the floor in MRI.

## 2016-09-26 NOTE — Evaluation (Signed)
Speech Language Pathology Evaluation Patient Details Name: [name] MRN: [id number] DOB: 1950/10/18 Today's Date: 09/26/2016 Time: [id number] SLP Time Calculation (min) (ACUTE ONLY): 49 min  Problem List:  Patient Active Problem List   Diagnosis Date Noted  . Stroke ([place]) 09/25/2016  . Pulmonary emboli ([place]) 09/10/2016  . Acute DVT (deep venous thrombosis) ([place]) 09/10/2016  . Hyponatremia 09/10/2016  . Normocytic anemia 09/10/2016  . Acute pulmonary embolism ([place]) 09/10/2016  . Essential hypertension, benign 06/10/2015  . Depression 06/10/2015  . Hyperlipidemia LDL goal <130 06/10/2015  . GERD (gastroesophageal reflux disease) 06/10/2015   Past Medical History:  Past Medical History:  Diagnosis Date  . Depression   . DVT (deep venous thrombosis) ([place])   . GERD (gastroesophageal reflux disease)   . 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 Hyperlipidemia   . Hypertension   . PE (pulmonary thromboembolism) ([place])    Past Surgical History:  Past Surgical History:  Procedure Laterality Date  . adnoidectomy    . APPENDECTOMY    . PROSTATE SURGERY    . TONSILLECTOMY AND ADENOIDECTOMY     HPI:  Mr. [name] is a 66 yo male who presented to [facility] yesterday with sudden onset confusion and aphasia.  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 Most extensive in the region of involvement is within the left MCA territory where there appears to be a missing left M2 branch. No large confluent infarction however. No hemorrhage, swelling or mass effect.    Assessment / Plan / Recommendation Clinical Impression  Pt seen at bedside with spouse present in room for speech/language evaluation. Wife reports that Pt was independent across all cognitive communication domains prior to this event. 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 They have only been in [place] for just over a year per wife (moving here from [place] and [place]). Pt currently presents with moderate/severe receptive and expressive aphasia characterized by limited content words (nouns) during verbalizations, difficulty with responsive, confrontation, and divergent naming, difficulty following simple verbal commands without a gesture cue or model, perseveration of previous responses ("yes"), and inability to write his name. He was able to copy a circle, square, and the letter, "J" with cues. His wife had many questions and I shared brief information regarding aphasia and apraxia, however stated that Pt may present differently tomorrow (hopeful for some improvement). She was encouraged to offer Pt choices and use multimodality cues (communicate by any means necessary) like gestures, pictures, and writing. Although the Pt was unable to formulate a meaningful (regarding fluency and content) sentence during picture description task, he was able to tell his wife, "I want Toby to visit" (his dog). Pt will benefit from continued skilled SLP intervention to maximize cognitive communication skills, decrease burden of care, and improve quality of life given acute communication changes. SLP will follow while in acute setting.     SLP Assessment  Patient needs continued Speech Lanaguage Pathology Services    Follow Up Recommendations  Outpatient SLP;Inpatient Rehab    Frequency and Duration min 2x/week  1 week      SLP Evaluation Cognition  Overall Cognitive Status: Impaired/Different from baseline Arousal/Alertness: Awake/alert Orientation Level: Oriented to person;Oriented to time;Oriented to situation;Disoriented to place Memory:  (unable to assess  due to aphasia) Awareness: Impaired Awareness Impairment: Emergent impairment Problem Solving: Impaired Problem  Solving Impairment: Verbal basic;Functional basic Executive Function: Decision Making;Organizing;Initiating;Self Monitoring;Self Correcting Organizing: Impaired Organizing Impairment: Verbal basic Decision Making: Impaired Decision Making Impairment: Verbal basic Initiating: Impaired Initiating Impairment: Verbal basic Self Monitoring: Impaired Self Monitoring Impairment: Verbal basic Self Correcting: Impaired Self Correcting Impairment: Verbal basic Safety/Judgment:  (to be further assessed)       Comprehension  Auditory Comprehension Overall Auditory Comprehension: Impaired Yes/No Questions: Impaired Basic Biographical Questions: 51-75% accurate Commands: Impaired One Step Basic Commands: 25-49% accurate Two Step Basic Commands: 25-49% accurate Conversation: Simple EffectiveTechniques: Visual/Gestural cues;Stressing words Visual Recognition/Discrimination Discrimination: Exceptions to [facility] Reading Comprehension Reading Status: Impaired Word level: Impaired Sentence Level: Impaired Paragraph Level: Not tested Functional Environmental (signs, name badge): Not tested    Expression Expression Primary Mode of Expression: Verbal Verbal Expression Overall Verbal Expression: Impaired Initiation: Impaired Automatic Speech: Name;Counting Level of Generative/Spontaneous Verbalization: Phrase (limited content words) Repetition: Impaired Level of Impairment: Word level;Phrase level Naming: Impairment Responsive: 26-50% accurate Confrontation: Impaired Convergent: 0-24% accurate Divergent: 0-24% accurate Verbal Errors:  (limited content words) Pragmatics: No impairment Effective Techniques: Sentence completion (place in automatic speech task; choral cues) Non-Verbal Means of Communication: Not applicable Written Expression Dominant Hand: Right Written Expression: Exceptions to [facility] Copy Ability: Letter (able to copy a circle, square, and a single letter) Self Formulation  Ability:  (unable)   Oral / Motor  Oral Motor/Sensory Function Overall Oral Motor/Sensory Function:  (Pt unable to follow commands related to oral motor exam) Motor Speech Overall Motor Speech: Impaired Respiration: Within functional limits Phonation: Normal Resonance: Within functional limits Articulation: Within functional limitis Intelligibility: Intelligible Motor Planning:  (to be assessed further) Motor Speech Errors: Groping for words              Thank you,  Genene Churn, [place]          [place] 09/26/2016, 4:18 PM

## 2016-09-27 ENCOUNTER — Inpatient Hospital Stay (HOSPITAL_COMMUNITY): Payer: Medicare Other

## 2016-09-27 ENCOUNTER — Encounter (HOSPITAL_COMMUNITY): Payer: Self-pay | Admitting: Radiology

## 2016-09-27 DIAGNOSIS — I639 Cerebral infarction, unspecified: Secondary | ICD-10-CM

## 2016-09-27 DIAGNOSIS — E785 Hyperlipidemia, unspecified: Secondary | ICD-10-CM

## 2016-09-27 DIAGNOSIS — I10 Essential (primary) hypertension: Secondary | ICD-10-CM

## 2016-09-27 LAB — CBC
HEMATOCRIT: 34.4 % — AB (ref 39.0–52.0)
Hemoglobin: 11.7 g/dL — ABNORMAL LOW (ref 13.0–17.0)
MCH: 30.4 pg (ref 26.0–34.0)
MCHC: 34 g/dL (ref 30.0–36.0)
MCV: 89.4 fL (ref 78.0–100.0)
Platelets: 171 10*3/uL (ref 150–400)
RBC: 3.85 MIL/uL — AB (ref 4.22–5.81)
RDW: 13.1 % (ref 11.5–15.5)
WBC: 6.5 10*3/uL (ref 4.0–10.5)

## 2016-09-27 LAB — TROPONIN I: Troponin I: 0.57 ng/mL (ref <0.03)

## 2016-09-27 LAB — URINALYSIS, ROUTINE W REFLEX MICROSCOPIC
Bilirubin Urine: NEGATIVE
Glucose, UA: NEGATIVE mg/dL
Ketones, ur: NEGATIVE mg/dL
Leukocytes, UA: NEGATIVE
Nitrite: NEGATIVE
PROTEIN: NEGATIVE mg/dL
Specific Gravity, Urine: 1.019 (ref 1.005–1.030)
pH: 5 (ref 5.0–8.0)

## 2016-09-27 LAB — BASIC METABOLIC PANEL
ANION GAP: 6 (ref 5–15)
BUN: 17 mg/dL (ref 6–20)
CO2: 27 mmol/L (ref 22–32)
Calcium: 9.2 mg/dL (ref 8.9–10.3)
Chloride: 105 mmol/L (ref 101–111)
Creatinine, Ser: 0.86 mg/dL (ref 0.61–1.24)
GFR calc Af Amer: >60 mL/min (ref >60)
GFR calc non Af Amer: >60 mL/min (ref >60)
GLUCOSE: 109 mg/dL — AB (ref 65–99)
POTASSIUM: 3.6 mmol/L (ref 3.5–5.1)
Sodium: 138 mmol/L (ref 135–145)

## 2016-09-27 LAB — VITAMIN B12: VITAMIN B 12: 1344 pg/mL — AB (ref 180–914)

## 2016-09-27 LAB — C-REACTIVE PROTEIN: CRP: 0.9 mg/dL (ref <1.0)

## 2016-09-27 LAB — SEDIMENTATION RATE: Sed Rate: 13 mm/hr (ref 0–16)

## 2016-09-27 LAB — HEMOGLOBIN A1C
HEMOGLOBIN A1C: 5.3 % (ref 4.8–5.6)
Mean Plasma Glucose: 105 mg/dL

## 2016-09-27 MED ORDER — AMLODIPINE BESYLATE 5 MG PO TABS
5.0000 mg | ORAL_TABLET | Freq: Every day | ORAL | Status: DC
  Administered 2016-09-27 – 2016-09-29 (×3): 5 mg via ORAL
  Filled 2016-09-27 (×3): qty 1

## 2016-09-27 MED ORDER — IOPAMIDOL (ISOVUE-370) INJECTION 76%
100.0000 mL | Freq: Once | INTRAVENOUS | Status: AC | PRN
  Administered 2016-09-27: 100 mL via INTRAVENOUS

## 2016-09-27 MED ORDER — APIXABAN 5 MG PO TABS
5.0000 mg | ORAL_TABLET | Freq: Two times a day (BID) | ORAL | Status: DC
  Administered 2016-09-28 – 2016-09-29 (×3): 5 mg via ORAL
  Filled 2016-09-27 (×3): qty 1

## 2016-09-27 NOTE — Progress Notes (Signed)
[name] [id number] DOB: August 10, 1951 DOA: 09/25/2016 PCP: Fransisca Kaufmann Dettinger, MD  Brief narrative:  66 y/o ? htn hld Recent admission 1/20-1/24 DVT and PE and placed on Xarelto TURP in [place] ~2010 Bipolar GERD Probable OSA Premature CAD-Stress test EF 56%, low risk study 10/23/15  Admit from home am 09/25/16 with slow speech and mild confusion-could not tell "where his room was".  Having some word-finding difficulty.  Was shuffling in gait, less talkative than usual.  more slow to speak acc to wife Aware of where he is but much slower to repsond than normal  CT head neg MRI brain confirmed multiple areas of infarct-? Embolic source  Past medical history-As per Problem list Chart reviewed as below-   Consultants:  neurology  Procedures:  Mri  Echo   carotid  Antibiotics:  none   Subjective   Confusion is improved He is able to do much more and was able to be without assistance today He still has some receptive aphasia   Objective    Interim History:   Telemetry: Benign-nsr   Objective: Vitals:   09/26/16 2234 09/27/16 0234 09/27/16 0634 09/27/16 1021  BP: 139/69 140/65 (!) 142/67 130/65  Pulse: 67 68 72 71  Resp: 18 18 18 18   Temp: 98 F (36.7 C) 98.5 F (36.9 C) 98.7 F (37.1 C) 98 F (36.7 C)  TempSrc: Oral Oral Oral Oral  SpO2: 97% 99% 98% 98%  Weight:      Height:        Intake/Output Summary (Last 24 hours) at 09/27/16 1343 Last data filed at 09/27/16 0053  Gross per 24 hour  Intake              240 ml  Output              650 ml  Net             -410 ml    Exam:  General: eomi ncat Cardiovascular:  s1 s2 no m/r/g Respiratory: clear no adde dsoudn Abdomen:  Soft nt nd no rebound no gaurd Skin no le edema Neuro confused-can comprehend but cannot Completely what he wants to say however gross motor is normal and he is able to move all 4 extremities  Data Reviewed: Basic Metabolic Panel:  Recent Labs Lab  09/25/16 1342 09/27/16 0440  NA 134* 138  K 3.8 3.6  CL 99* 105  CO2 29 27  GLUCOSE 110* 109*  BUN 19 17  CREATININE 1.00 0.86  CALCIUM 10.2 9.2   Liver Function Tests:  Recent Labs Lab 09/25/16 1342  AST 26  ALT 25  ALKPHOS 106  BILITOT 0.7  PROT 7.2  ALBUMIN 4.0   No results for input(s): LIPASE, AMYLASE in the last 168 hours. No results for input(s): AMMONIA in the last 168 hours. CBC:  Recent Labs Lab 09/25/16 1342 09/27/16 0440  WBC 6.9 6.5  NEUTROABS 4.7  --   HGB 12.9* 11.7*  HCT 38.3* 34.4*  MCV 89.7 89.4  PLT 206 171   Cardiac Enzymes:  Recent Labs Lab 09/25/16 2141 09/26/16 0417 09/27/16 0440  TROPONINI 1.23* 1.29* 0.57*   BNP: Invalid input(s): POCBNP CBG:  Recent Labs Lab 09/25/16 1341  GLUCAP 101*    No results found for this or any previous visit (from the past 240 hour(s)).   Studies:              All Imaging reviewed and is as per above notation  Scheduled Meds: . aspirin EC  81 mg Oral Daily  . atorvastatin  80 mg Oral Daily  . escitalopram  20 mg Oral Daily  . pantoprazole  40 mg Oral BID  . polyvinyl alcohol  1 drop Both Eyes BID  . rivaroxaban  15 mg Oral BID  . [START ON 10/05/2016] rivaroxaban  20 mg Oral Daily   Continuous Infusions: . 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  Already on [facility].  Not candidate on admit TPA.  AC and Anti-plt as per neurology-appreciate input.  Note that CT angiogram of aortic arch did not show aortic source that there was significant lymphadenopathy in the lower abdominal viscera -we will get CT abdomen pelvis to rule out a dedicated  Source?.   echo did not show right-to-left shunting-vasculitis/dementia labs have been ordered including homocystine sedimentation rate CRP B12 ANA HIV RPR 2. Elevated troponin without injury pattern on EKG-rpt I-stat troponin is +--this could be a marker of occult malignancy.EKG  benign.   no cp.  no current further work-up  3. Recent LLE DVt/ bilateral PE 09/11/15-continue Elliquis> xaretlo  As part of work-up, Echo done last admit showed no specfic mention of PFO.    4. Chronic diastolic dysfunction-based on echo 1/22-no acute symptoms 5. GERD-protonix 40 bid 6. htn-hold amlodipine and Valsartan HCTZ.   slowly resuming amlodipine 5 daily 7. Depression-cont lexapro  Review Inpatient  Full code  pending resolution will need at least 48 hours more in hospital--needs further work-up inclding CT abd/pelv and OP rehab with SLP at [facility] OP rehab Likely can d/c to home once work-up complete 48 hr?  Verneita Griffes, MD  [facility] Pager [phone] 09/27/2016, 1:43 PM    LOS: 2 days

## 2016-09-27 NOTE — Progress Notes (Signed)
Speech Language Pathology Treatment: Cognitive-Linquistic  Patient Details Name: [name] MRN: [id number] DOB: [birth year] Today's Date: 09/27/2016 Time: [id number] SLP Time Calculation (min) (ACUTE ONLY): 60 min  Assessment / Plan / Recommendation Clinical Impression  Pt was seen for skilled diagnostic and therapeutic aphasia and apraxia intervention. Portions of the WAB (Western Aphasia Battery) were administered. Pt demonstrates improvement in expressive and receptive language skills from yesterday, however moderate mixed aphasia persists. Pt demonstrated improved comprehension with yes/no questions and 1-2 step commands when provided in context and when given verbal/visual cues. Auditory comprehension continues to be impaired when shifting tasks/topics. Pt had difficult pointing to pictured objects in field of 6 via verbal request, however suspect performance negatively impacted by suspected lower right visual field quadrant impairment (will need further assessment). Expressively, pt with relative strengths in sentence completion tasks and automatic speech tasks. He continues to have difficulty with word fluency, connected speech tasks, confrontation naming, and responsive naming. Pt is demonstrating evolving error awareness and did show frustration when unable to verbalize appropriate words at times. SLP provided ongoing education regarding aphasia and apraxia to pt and spouse. Pt will need follow up SLP services post discharge from acute setting (likely via outpatient given lack of physical impairments to qualify for acute rehab). Pt is highly motivated and has excellent caregiver support.   HPI HPI: Mr. Aldahir Gordillo is a 66 yo male who presented to [facility] yesterday with sudden onset confusion and aphasia.       SLP Plan  Continue with current plan of care (outpatient at [facility])     Recommendations                   Follow up Recommendations: Outpatient SLP Plan: Continue with  current plan of care (outpatient at [facility])       Thank you,  Genene Churn, [place]                 [place] 09/27/2016, 2:45 PM

## 2016-09-27 NOTE — Evaluation (Signed)
Physical Therapy Evaluation Patient Details Name: [name] MRN: [id number] DOB: 1951-04-06 Today's Date: 09/27/2016   History of Present Illness  Mr. [name] is a 66 yo male who presented to [facility] yesterday with sudden onset confusion and aphasia. 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    Clinical Impression  Pt received sitting up on the bench next to his wife in the hospital room, and pt is agreeable to PT evaluation.  Pt is able to express that he was independent with ambulation, bathing, dressing, working at [employer], playing golf, and using the elliptical.  During PT evaluation he demonstrates sit<>Stand transfers independently, and bed mobility independently. He ambulated 538ft with supervision/min guard due to veering from path both directions, and mild LOB with directional changes.  He was able to perform retro gait and side stepping, however with unsteadiness and multiple cues.  He demonstrates improved comprehension of commands with visual cues.  He demonstrates some visual field cut when reading signs throughout the hallway, and has great difficulty finding his way back to his room.  He is recommended for OPPT to progress his balance, and recommend that he have 24/7 supervision/assistance at home.      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)    Equipment Recommendations  None recommended by PT    Recommendations for Other Services       Precautions / Restrictions Precautions Precautions: None Restrictions Weight Bearing Restrictions: No      Mobility  Bed Mobility Overal bed mobility: Modified Independent                Transfers Overall transfer level: Modified independent Equipment used: None                Ambulation/Gait Ambulation/Gait  assistance: Min guard Ambulation Distance (Feet): 500 Feet Assistive device: None Gait Pattern/deviations: Step-through pattern;Drifts right/left     General Gait Details: Pt demonstrates mild LOB with head turns either direction, as well as with side stepping and retro gait.  Pt also demonstrates visual impairements.  He was able to read the word soiled but not utility (which is written underneath) for the soiled utility room.  He was also not able to verbalize the room numbers when asked to read different room numbers in the hallway.  Expressed to pt 3 times that his room is 311, and PT had to point his room number out to him (located on the right), and he expressed "ah finally"   Stairs            Wheelchair Mobility    Modified Rankin (Stroke Patients Only) Modified Rankin (Stroke Patients Only) Pre-Morbid Rankin Score: No symptoms Modified Rankin: Moderate disability     Balance Overall balance assessment: Needs assistance         Standing balance support: No upper extremity supported Standing balance-Leahy Scale: Fair               High level balance activites: Side stepping;Backward walking;Direction changes High Level Balance Comments: mild LOB with changes in direction.              Pertinent Vitals/Pain Pain Assessment: No/denies pain    Home Living   Living Arrangements: Spouse/significant other Available Help at Discharge: Family;Available 24 hours/day Type of Home: House (the house is a bed and breakfast - guests stay upstairs.  ) Home  Access: Level entry   Entrance Stairs-Number of Steps: Garage - no steps.  Home Layout: Able to live on main level with bedroom/bathroom;Two level Home Equipment: None;Grab bars - tub/shower      Prior Function Level of Independence: Independent   Gait / Transfers Assistance Needed: independent - playing golf, using ellyptical, walking dogs.  ADL's / Homemaking Assistance Needed: independent with dressing,  and bathing.  Driving.          Hand Dominance   Dominant Hand: Right    Extremity/Trunk Assessment   Upper Extremity Assessment Upper Extremity Assessment: RUE deficits/detail RUE Deficits / Details: Noted R UE mildly weaker compared to left, however pt had some difficulty with following commands for MMT.      Lower Extremity Assessment Lower Extremity Assessment: Generalized weakness       Communication   Communication: Expressive difficulties;Receptive difficulties  Cognition Arousal/Alertness: Awake/alert Behavior During Therapy: WFL for tasks assessed/performed Overall Cognitive Status: Impaired/Different from baseline Area of Impairment: Orientation Orientation Level: Time;Disoriented to             General Comments: Pt was only able to express orientation when given multiple choice.  He was able to choose the correct answer for place and situation, however he was not able to choose the correct year for date, and stated it was November.     General Comments      Exercises     Assessment/Plan    PT Assessment Patient needs continued PT services  PT Problem List Decreased strength;Decreased activity tolerance;Decreased balance;Decreased mobility;Decreased coordination;Decreased cognition;Decreased safety awareness;Decreased knowledge of precautions          PT Treatment Interventions Gait training;Functional mobility training;Therapeutic activities;Therapeutic exercise;Balance training;Patient/family education;Neuromuscular re-education;Cognitive remediation    PT Goals (Current goals can be found in the Care Plan section)  Acute Rehab PT Goals Patient Stated Goal: Pt wants to go home  PT Goal Formulation: With patient Time For Goal Achievement: 10/04/16 Potential to Achieve Goals: Good    Frequency Min 3X/week   Barriers to discharge        Co-evaluation               End of Session Equipment Utilized During Treatment: Gait belt Activity  Tolerance: Patient tolerated treatment well Patient left: in bed;with call bell/phone within reach;with family/visitor present Nurse Communication: Mobility status (mobility sheet left hanging in the room. )    Functional Assessment Tool Used: [employer] "6-clicks"  Functional Limitation: Mobility: Walking and moving around Mobility: Walking and Moving Around Current Status [phone]): At least 20 percent but less than 40 percent impaired, limited or restricted Mobility: Walking and Moving Around Goal Status [phone]): At least 1 percent but less than 20 percent impaired, limited or restricted    Time: [id number] PT Time Calculation (min) (ACUTE ONLY): 32 min   Charges:   PT Evaluation $PT Eval Low Complexity: 1 Procedure PT Treatments $Gait Training: 8-22 mins   PT G Codes:   PT G-Codes **NOT FOR INPATIENT CLASS** Functional Assessment Tool Used: [employer] "6-clicks"  Functional Limitation: Mobility: Walking and moving around Mobility: Walking and Moving Around Current Status [phone]): At least 20 percent but less than 40 percent impaired, limited or restricted Mobility: Walking and Moving Around Goal Status [phone]): At least 1 percent but less than 20 percent impaired, limited or restricted    Beth Nuria Phebus, PT, DPT X: [phone]

## 2016-09-27 NOTE — Progress Notes (Signed)
Unable to do neuro and VS at scheduled time due to physical therapy working with patient.

## 2016-09-27 NOTE — Evaluation (Addendum)
Occupational Therapy Evaluation Patient Details Name: [name] MRN: [id number] DOB: 1951/05/24 Today's Date: 09/27/2016    History of Present Illness Mr. [name] is a 66 yo male who presented to [facility] yesterday with sudden onset confusion and aphasia. 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 Neurology came in during evaluation and pt did better with naming tasks compared to yesterday's assessment. During evaluation pt able to answer simple yes/no questions and attempts to provide PLOF information via short sentences with increased time, cuing for word finding at times. During evaluation pt demonstrates independence in ADL task completion and requires assistance for managing lines during functional mobility. Cuing for sequencing required at times. Recommend 24/7 supervision on discharge home for safety due to cognitive deficits.   Addendum: Per SLP pt right hand fatigue noted with sustained writing task. PT notes difficulty reading signs in hallway, possible right homonymous hemianopsia. Recommend OPOT evaluation including low vision evaluation.     Follow Up Recommendations  Outpatient OT; Supervision/Assistance - 24 hour    Equipment Recommendations  None recommended by OT       Precautions / Restrictions Precautions Precautions: None Restrictions Weight Bearing Restrictions: No      Mobility Bed Mobility Overal bed mobility: Modified Independent                Transfers Overall transfer level: Modified independent                         ADL Overall ADL's : Needs assistance/impaired     Grooming: Wash/dry hands;Modified independent;Standing               Lower Body Dressing: Modified independent;Sitting/lateral leans   Toilet Transfer: Modified Independent;Regular Toilet;Grab bars   Toileting- Clothing Manipulation and Hygiene:  Modified independent;Sit to/from stand       Functional mobility during ADLs: Supervision/safety       Vision Vision Assessment?: No apparent visual deficits (observed in functional task completion)          Pertinent Vitals/Pain Pain Assessment: No/denies pain     Hand Dominance Right   Extremity/Trunk Assessment Upper Extremity Assessment Upper Extremity Assessment: Overall WFL for tasks assessed   Lower Extremity Assessment Lower Extremity Assessment: Defer to PT evaluation   Cervical / Trunk Assessment Cervical / Trunk Assessment: Normal   Communication Communication Communication: Expressive difficulties;Receptive difficulties   Cognition Arousal/Alertness: Awake/alert Behavior During Therapy: WFL for tasks assessed/performed Overall Cognitive Status: Impaired/Different from baseline                                Home Living Family/patient expects to be discharged to:: Private residence Living Arrangements: Spouse/significant other Available Help at Discharge: Family Type of Home: House (He and wife own bed and breakfast in [place]) Home Access: Stairs to enter [employer] of Steps:  ("lots" unable to further specify)   Home Layout: Two level     Bathroom Shower/Tub: Occupational psychologist: Standard     Home Equipment: None      Lives With: Spouse    Prior Functioning/Environment Level of Independence: Independent                 OT Problem List: Decreased cognition    End of Session Equipment Utilized During Treatment: Teacher, adult education  Communication: Mobility status;Other (comment) (pt up  in chair)  Activity Tolerance: Patient tolerated treatment well Patient left: in chair;with call bell/phone within reach   Time: 0830-0905 OT Time Calculation (min): 35 min Charges:  OT General Charges $OT Visit: 1 Procedure OT Evaluation $OT Eval Low Complexity: 1 Procedure    [name], OTR/L   [phone]  09/27/2016, 9:29 AM

## 2016-09-27 NOTE — Progress Notes (Signed)
Patient was NPO by Dr. [name] for possible TEE with cardiology.  Dr. Verlon Au notified and gave order for heart healthy diet - no TEE.

## 2016-09-27 NOTE — Progress Notes (Addendum)
ANTICOAGULATION CONSULT NOTE - Initial Consult  Pharmacy Consult for Apixaban Indication: DVT / stroke  No Known Allergies  Patient Measurements: Height: 6\' 1"  (185.4 cm) Weight: 235 lb (106.6 kg) IBW/kg (Calculated) : 79.9 Heparin Dosing Weight:   Vital Signs: Temp: 98.5 F (36.9 C) (02/06 1542) Temp Source: Oral (02/06 1542) BP: 116/49 (02/06 1542) Pulse Rate: 73 (02/06 1542)  Labs:  Recent Labs  09/25/16 1342 09/25/16 2141 09/26/16 0417 09/27/16 0440  HGB 12.9*  --   --  11.7*  HCT 38.3*  --   --  34.4*  PLT 206  --   --  171  APTT 36  --   --   --   LABPROT 24.2*  --   --   --   INR 2.13  --   --   --   CREATININE 1.00  --   --  0.86  TROPONINI  --  1.23* 1.29* 0.57*    Estimated Creatinine Clearance: 106.8 mL/min (by C-G formula based on SCr of 0.86 mg/dL).   Medical History: Past Medical History:  Diagnosis Date  . Depression   . DVT (deep venous thrombosis) ([facility])   . GERD (gastroesophageal reflux disease)   . 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 Hyperlipidemia   . Hypertension   . PE (pulmonary thromboembolism) ([facility])     Medications:  Prescriptions Prior to Admission  Medication Sig Dispense Refill Last Dose  . amLODipine (NORVASC) 5 MG tablet Take 1 tablet (5 mg total) by mouth daily. 90 tablet 3 Taking  . atorvastatin (LIPITOR) 80 MG tablet Take 1 tablet (80 mg total) by mouth daily. (Patient taking differently: Take 80 mg by mouth at bedtime. ) 90 tablet 3 09/24/2016 at Unknown time  . Coenzyme Q10-Vitamin E (QUNOL ULTRA COQ10 PO) Take by mouth daily.   09/25/2016 at Unknown time  . Cyanocobalamin (VITAMIN B-12 PO) Take 1 tablet by mouth daily.   09/25/2016 at Unknown time  . escitalopram (LEXAPRO) 20 MG tablet Take 1 tablet (20 mg total) by mouth daily. 90 tablet 2 09/25/2016 at Unknown time  . Multiple Vitamin (MULTIVITAMIN WITH MINERALS) TABS tablet Take 1 tablet by mouth at bedtime.   09/25/2016 at Unknown time  . omeprazole (PRILOSEC) 20 MG  capsule Take 1 capsule (20 mg total) by mouth daily. 90 capsule 3 09/24/2016 at Unknown time  . Polyvinyl Alcohol-Povidone (REFRESH OP) Place 1 drop into both eyes 2 (two) times daily as needed (dry eyes).   09/24/2016 at Unknown time  . Pseudoephedrine HCl (SUDAFED 12 HOUR PO) Take 1 tablet by mouth at bedtime as needed (sinus congestion).   Taking  . rivaroxaban (XARELTO) 20 MG TABS tablet Take 1 tablet (20 mg total) by mouth daily with supper. (Patient taking differently: Take 15 mg by mouth 2 (two) times daily. ) 90 tablet 3 09/25/2016 at 930  . sildenafil (VIAGRA) 100 MG tablet Take 0.5-1 tablets (50-100 mg total) by mouth daily as needed for erectile dysfunction. (Patient taking differently: Take 50 mg by mouth daily as needed for erectile dysfunction. ) 30 tablet 1 Past Month at Unknown time  . TURMERIC PO Take 3 capsules by mouth daily.   09/25/2016 at Unknown time  . valsartan-hydrochlorothiazide (DIOVAN-HCT) 320-12.5 MG tablet Take 1 tablet by mouth daily. 90 tablet 3 09/25/2016 at Unknown time    Assessment: Recent admission for DVT/PE and placed on Xarelto. Presently on Xarelto 20 mg daily MD consult to change to Apixaban  for DVT and now stroke.  Goal of Therapy:  Full dose Apixaban Monitor platelets by anticoagulation protocol: Yes   Plan:  Discontinue Xarelto, note dose given this AM Start Apixaban 5 mg po bid in AM Monitor CBC and signs of bleeding.  [name], [name] [place] 09/27/2016,5:28 PM

## 2016-09-27 NOTE — Consult Note (Signed)
CARDIOLOGY CONSULT NOTE       Patient ID: [name] MRN: [id number] DOB/AGE: [birth year] 68 y.o.  Admit date: 09/25/2016 Referring Physician:  Verlon Au Primary Physician: Fransisca Kaufmann Dettinger, MD Primary Cardiologist: New Reason for Consultation:  Stroke  Active Problems:   Stroke [facility])   HPI:  66 y.o. admitted with confusion and acute aphasia. History of PE / DVT on anticoagulation already. 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  History of HTN and elevated lipids on Rx Indicates compliance with xarelto. Telemetry with no afib. This am still with issues finding words. No dyspnea palpitations syncope or new LE pain / edema. Reviewed TTE I read yesterday and negative bubble study with no cardiac SOE.  ROS All other systems reviewed and negative except as noted above  Past Medical History:  Diagnosis Date  . Depression   . DVT (deep venous thrombosis) ([place])   . GERD (gastroesophageal reflux disease)   . 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 Hyperlipidemia   . Hypertension   . PE (pulmonary thromboembolism) ([facility])     Family History  Problem Relation Age of Onset  . COPD Mother   . Heart disease Mother   . AAA (abdominal aortic aneurysm) Mother   . Heart disease Father   . Depression Father   . Diabetes Father   . Heart disease Brother   . Early death Brother     heart attack  . Heart disease Maternal Grandmother   . Heart disease Maternal Grandfather   . Stroke Paternal Grandmother   . Heart disease Brother   . Diabetes Brother     Social History   Social History  . Marital status: Married    Spouse name: N/A  . Number of children: N/A  . Years of education: N/A   Occupational History  . Not on file.   Social History Main Topics  . Smoking status: Never Smoker  . Smokeless tobacco: Never Used  . Alcohol use 0.0 oz/week     Comment: occasional  . Drug use: No  . Sexual activity: Yes    Birth  control/ protection: Post-menopausal     Comment: Married for 16 years   Other Topics Concern  . Not on file   Social History Narrative   Epworth Sleepiness Scale = 12 (as of 09/09/2015)    Past Surgical History:  Procedure Laterality Date  . adnoidectomy    . APPENDECTOMY    . PROSTATE SURGERY    . TONSILLECTOMY AND ADENOIDECTOMY       . aspirin EC  81 mg Oral Daily  . atorvastatin  80 mg Oral Daily  . escitalopram  20 mg Oral Daily  . pantoprazole  40 mg Oral BID  . polyvinyl alcohol  1 drop Both Eyes BID  . rivaroxaban  15 mg Oral BID  . [START ON 10/05/2016] rivaroxaban  20 mg Oral Daily   . sodium chloride 75 mL/hr at 09/27/16 0303    Physical Exam: Blood pressure (!) 142/67, pulse 72, temperature 98.7 F (37.1 C), temperature source Oral, resp. rate 18, height 6\' 1"  (1.854 m), weight 235 lb (106.6 kg), SpO2 98 %.    Affect appropriate Healthy:  appears stated age 66: normal Neck supple with no adenopathy JVP normal no bruits no thyromegaly Lungs clear with no wheezing and good diaphragmatic motion Heart:  S1/S2 no murmur, no rub, gallop or click PMI normal Abdomen: benighn, BS positve,  no tenderness, no AAA no bruit.  No HSM or HJR Distal pulses intact with no bruits No edema Neuro- aphasia  Skin warm and dry No muscular weakness   Labs:   Lab Results  Component Value Date   WBC 6.5 09/27/2016   HGB 11.7 (L) 09/27/2016   HCT 34.4 (L) 09/27/2016   MCV 89.4 09/27/2016   PLT 171 09/27/2016     Recent Labs Lab 09/25/16 1342 09/27/16 0440  NA 134* 138  K 3.8 3.6  CL 99* 105  CO2 29 27  BUN 19 17  CREATININE 1.00 0.86  CALCIUM 10.2 9.2  PROT 7.2  --   BILITOT 0.7  --   ALKPHOS 106  --   ALT 25  --   AST 26  --   GLUCOSE 110* 109*   Lab Results  Component Value Date   TROPONINI 0.57 (HH) 09/27/2016    Lab Results  Component Value Date   CHOL 95 09/26/2016   CHOL 134 07/25/2016   Lab Results  Component Value Date   HDL 29 (L)  09/26/2016   HDL 41 07/25/2016   Lab Results  Component Value Date   LDLCALC 49 09/26/2016   LDLCALC 73 07/25/2016   Lab Results  Component Value Date   TRIG 86 09/26/2016   TRIG 98 07/25/2016   Lab Results  Component Value Date   CHOLHDL 3.3 09/26/2016   CHOLHDL 3.3 07/25/2016   No results found for: LDLDIRECT    Radiology: Dg Chest 2 View  Result Date: 09/10/2016 CLINICAL DATA:  Right foot pain and swelling EXAM: CHEST  2 VIEW COMPARISON:  None. FINDINGS: There is consolidation at the posterior left lung base. Subsegmental atelectasis at the right base. Small pleural effusions are suspected. Upper normal heart size. No pneumothorax. IMPRESSION: Left lower lobe pneumonia. Followup PA and lateral chest X-ray is recommended in 3-4 weeks following trial of antibiotic therapy to ensure resolution and exclude underlying malignancy. Electronically Signed   By: Marybelle Killings M.D.   On: 09/10/2016 15:25   Ct Angio Chest Pe W And/or Wo Contrast  Result Date: 09/10/2016 CLINICAL DATA:  Short of breath, concern for pulmonary embolism. The swelling. EXAM: CT ANGIOGRAPHY CHEST WITH CONTRAST TECHNIQUE: Multidetector CT imaging of the chest was performed using the standard protocol during bolus administration of intravenous contrast. Multiplanar CT image reconstructions and MIPs were obtained to evaluate the vascular anatomy. CONTRAST:  80 mL Isovue COMPARISON:  Indications FINDINGS: Cardiovascular: Filling defect within the proximal LEFT lower lobe pulmonary artery which is elongated tubular and extends into the subsegmental branches consist with acute pulmonary embolism. Additional filling defect within the lingular pulmonary artery. Within the segmental branches of the RIGHT lower lobe there are additional filling defects. Filling defect within the RIGHT upper lobe pulmonary artery. Overall clot burden is moderate to severe. No evidence of RIGHT ventricular strain with the RIGHT ventricle to LEFT  ventricle ratio less than 1. Mediastinum/Nodes: No axillary supraclavicular adenopathy. No mediastinal adenopathy. Lungs/Pleura: Peribronchial thickening in the LEFT lower lobe. No clear evidence of infarction. No pneumonia. Small LEFT effusion. Upper Abdomen: Limited view of the liver, kidneys, pancreas are unremarkable. Normal adrenal glands. Musculoskeletal: No aggressive osseous lesion. Review of the MIP images confirms the above findings. IMPRESSION: 1. Multi lobar pulmonary emboli involving the LEFT lower lobe pulmonary arteries, RIGHT lower lobe pulmonary arteries, lingular pulmonary artery and RIGHT upper lobe pulmonary artery. 2. Overall clot burden is moderate to severe. 3. No evidence of RIGHT  ventricular strain with the RIGHT ventricle ratio to LEFT ventricular ratio less than 1. 4. LEFT basilar atelectasis versus less likely infarction. Small LEFT effusion. Critical Value/emergent results were called by telephone at the time of interpretation on 09/10/2016 at 6:58 pm to Dr. Dorie Rank , who verbally acknowledged these results. Electronically Signed   By: Suzy Bouchard M.D.   On: 09/10/2016 18:59   Mr Brain Wo Contrast  Result Date: 09/26/2016 CLINICAL DATA:  Confusion and altered mental status over the last 2 days. EXAM: MRI HEAD WITHOUT CONTRAST MRA HEAD WITHOUT CONTRAST TECHNIQUE: Multiplanar, multiecho pulse sequences of the brain and surrounding structures were obtained without intravenous contrast. Angiographic images of the head were obtained using MRA technique without contrast. COMPARISON:  CT 09/25/2016 FINDINGS: MRI HEAD FINDINGS Brain: There is scattered punctate acute infarctions scattered throughout all vascular territories of the anterior and posterior circulation. These are most extensive in the left MCA territory were there are dozens of small discrete infarctions. No large confluent infarction. The findings are consistent with embolic infarctions from the heart or ascending aorta.  The brain does not show a background pattern of chronic disease. No evidence of mass lesion, hemorrhage, hydrocephalus or extra-axial collection. Vascular: Major vessels at the base of the brain show flow. Skull and upper cervical spine: Negative Sinuses/Orbits: Clear/normal Other: None MRA HEAD FINDINGS Both internal carotid arteries are widely patent into the brain. The anterior and middle cerebral vessels are patent without proximal stenosis, aneurysm or vascular malformation. There is probably an occluded M2 branch of the left middle cerebral artery. No posterior circulation large or medium vessel abnormality is seen. Incidental vertebral fenestration. 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 Most extensive in the region of involvement is within the left MCA territory where there appears to be a missing left M2 branch. No large confluent infarction however. No hemorrhage, swelling or mass effect. No pre-existing brain disease identified. Electronically Signed   By: Nelson Chimes M.D.   On: 09/26/2016 10:41   US Carotid Bilateral (at [facility] Only)  Result Date: 09/26/2016 CLINICAL DATA:  CVA. EXAM: BILATERAL CAROTID DUPLEX ULTRASOUND TECHNIQUE: Pearline Cables scale imaging, color Doppler and duplex ultrasound were performed of bilateral carotid and vertebral arteries in the neck. COMPARISON:  None. FINDINGS: Criteria: Quantification of carotid stenosis is based on velocity parameters that correlate the residual internal carotid diameter with NASCET-based stenosis levels, using the diameter of the distal internal carotid lumen as the denominator for stenosis measurement. The following velocity measurements were obtained: RIGHT ICA:  76 cm/sec CCA:  78 cm/sec SYSTOLIC ICA/CCA RATIO:  1.0 DIASTOLIC ICA/CCA RATIO:  1.6 ECA:  88 cm/sec LEFT ICA:  63 cm/sec CCA:  88 cm/sec SYSTOLIC ICA/CCA  RATIO:  0.7 DIASTOLIC ICA/CCA RATIO:  1.6 ECA:  96 cm/sec RIGHT CAROTID ARTERY: Small amount of plaque at the right carotid bulb without significant stenosis. External carotid artery is patent with normal waveform. Minimal plaque at the origin of the internal carotid artery. Normal waveforms and velocities in the internal carotid artery. RIGHT VERTEBRAL ARTERY: Antegrade flow and normal waveform in the right vertebral artery. LEFT CAROTID ARTERY: Minimal plaque at the left carotid bulb. External carotid artery is patent with normal waveform. Normal waveforms and velocities in the internal carotid artery. LEFT VERTEBRAL ARTERY: Antegrade flow and normal waveform in the left vertebral artery. IMPRESSION: Minimal atherosclerotic disease in the carotid arteries. No significant carotid  artery stenosis. Estimated degree of stenosis in the internal carotid arteries is less than 50% bilaterally. Patent vertebral arteries with antegrade flow. Electronically Signed   By: Markus Daft M.D.   On: 09/26/2016 09:51   US Aorta  Result Date: 09/21/2016 CLINICAL DATA:  Family history of abdominal aortic aneurysm EXAM: ULTRASOUND OF ABDOMINAL AORTA TECHNIQUE: Ultrasound examination of the abdominal aorta was performed to evaluate for abdominal aortic aneurysm. COMPARISON:  None. FINDINGS: Abdominal Aorta No aneurysm identified. Maximum Diameter: Proximal aorta measures 2.5 x 2.8 cm in diameter. Mid aorta measures 2.1 x 2 cm in diameter. Distal aorta measures 1.9 x 2 cm in diameter. Right common iliac artery measures 0.9 x 1 cm in diameter. Left common iliac artery measures 0.9 x 1 cm in diameter. IMPRESSION: No evidence of abdominal aortic aneurysm. Abdominal aorta measures 2.5 x 2.8 cm maximum diameter proximally. Electronically Signed   By: Lahoma Crocker M.D.   On: 09/21/2016 11:25   Mr Jodene Nam Head/brain [id number] Cm  Result Date: 09/26/2016 CLINICAL DATA:  Confusion and altered mental status over the last 2 days. EXAM: MRI HEAD WITHOUT  CONTRAST MRA HEAD WITHOUT CONTRAST TECHNIQUE: Multiplanar, multiecho pulse sequences of the brain and surrounding structures were obtained without intravenous contrast. Angiographic images of the head were obtained using MRA technique without contrast. COMPARISON:  CT 09/25/2016 FINDINGS: MRI HEAD FINDINGS Brain: There is scattered punctate acute infarctions scattered throughout all vascular territories of the anterior and posterior circulation. These are most extensive in the left MCA territory were there are dozens of small discrete infarctions. No large confluent infarction. The findings are consistent with embolic infarctions from the heart or ascending aorta. The brain does not show a background pattern of chronic disease. No evidence of mass lesion, hemorrhage, hydrocephalus or extra-axial collection. Vascular: Major vessels at the base of the brain show flow. Skull and upper cervical spine: Negative Sinuses/Orbits: Clear/normal Other: None MRA HEAD FINDINGS Both internal carotid arteries are widely patent into the brain. The anterior and middle cerebral vessels are patent without proximal stenosis, aneurysm or vascular malformation. There is probably an occluded M2 branch of the left middle cerebral artery. No posterior circulation large or medium vessel abnormality is seen. Incidental vertebral fenestration. 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 Most extensive in the region of involvement is within the left MCA territory where there appears to be a missing left M2 branch. No large confluent infarction however. No hemorrhage, swelling or mass effect. No pre-existing brain disease identified. Electronically Signed   By: Nelson Chimes M.D.   On: 09/26/2016 10:41   Ct Head Code Stroke Wo Contrast`  Result Date: 09/25/2016 CLINICAL DATA:  Code stroke.  Confusion and dysphagia  since 10 a.m. EXAM: CT HEAD WITHOUT CONTRAST TECHNIQUE: Contiguous axial images were obtained from the base of the skull through the vertex without intravenous contrast. COMPARISON:  None. FINDINGS: Brain: No evidence of acute infarction, hemorrhage, hydrocephalus, extra-axial collection or mass lesion/mass effect. Vascular: Atherosclerotic calcification. Skull: No acute or aggressive finding. Mild enlargement of biparietal foramina. Sinuses/Orbits: Negative Other: These results were called by telephone at the time of interpretation on 09/25/2016 at 2:05 pm to Dr. Reather Converse, who verbally acknowledged these results. ASPECTS [facility] Stroke Program Early CT Score) - Ganglionic level infarction (caudate, lentiform nuclei, internal capsule, insula, M1-M3 cortex): 7 - Supraganglionic infarction (M4-M6 cortex): 3 Total score (0-10 with 10 being normal): 10 IMPRESSION: No  acute finding. ASPECTS is 10. Electronically Signed   By: Monte Fantasia M.D.   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 No need for TEE as TTE good images and bubble study negative. Will arrange outpatient f/u with Dr Lovena Le at [facility] for possible loop recorder. Consider changing to Eliquis as alternative DOAC with less intracranial bleeding risk with presumed xarelto failure. Coumadin would be more cumbersome  HTN:  Well controlled.  Continue current medications and low sodium Dash type diet.    Chol: continue statin  CVA:  Will need rehab carotids ok Telemetry no afib Echo ok Swallowing study for diet  Signed: Jenkins Rouge 09/27/2016, 8:37 AM

## 2016-09-27 NOTE — Progress Notes (Signed)
[place] A. Donald Laughter, MD     [email or web address]          [name] is an 66 y.o. male.   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 We also should consider the possibility of vasculitis which could be a confounder. A sedimentation rate has been ordered and this was fine. A C-reactive protein is pending which is more sensitive however. I'll also do an ANA, RPR amd HIV.  Given the findings, I think we should consult cardiology for possible TEE. Also think we should switch his Xarelto to eliquis. Again the patient should have a loop recorder.    The patient appears improved today. His global aphasia has improved.   GENERAL: This is a pleasant obese man in no acute distress.  HEENT: Supple. Atraumatic normocephalic.   ABDOMEN: soft  EXTREMITIES: No edema   BACK: Normal.  SKIN: Normal by inspection.    MENTAL STATUS: He is awake and alert but disoriented. He is awake and alert. He continues to have difficulty with comprehension and the expression. He has significant word finding difficulties but today he was able to name 4 out of 5 objects with prompting. He continues to have severe impairment of comprehension and fluency.  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  MOTOR: Normal tone, bulk and strength; no pronator drift. There are no rest of the legs or upper extremities.  COORDINATION: There is no clear evidence of dysmetria, No rest tremor; no intention tremor; no postural tremor; no bradykinesia.  REFLEXES: Deep tendon reflexes are symmetrical and normal.  Babinski reflexes are flexor bilaterally.   SENSATION: Normal to light touch. However, the patient seemed to extinguish on double simultaneous simulation on the right and left leg and then on the right upper extremity which is somewhat confusing.         Blood pressure (!) 142/67, pulse 72, temperature 98.7 F (37.1 C), temperature source Oral, resp. rate 18, height _0  (1.854 m), weight 235 lb (106.6 kg), SpO2 98 %.  Past Medical History:  Diagnosis Date  . Depression   . DVT (deep venous thrombosis) ([place])   . GERD (gastroesophageal reflux disease)   . 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 Hyperlipidemia   . Hypertension   . PE (pulmonary thromboembolism) ([place])     Past Surgical History:  Procedure Laterality Date  . adnoidectomy    . APPENDECTOMY    . PROSTATE SURGERY    . TONSILLECTOMY AND ADENOIDECTOMY      Family History  Problem Relation Age of Onset  . COPD Mother   . Heart disease Mother   . AAA (abdominal aortic aneurysm) Mother   . Heart disease Father   . Depression Father   . Diabetes Father   . Heart disease Brother   . Early death Brother     heart attack  . Heart disease Maternal Grandmother   . Heart disease Maternal Grandfather   . Stroke Paternal Grandmother   . Heart disease Brother   . Diabetes Brother     Social History:  reports that he has never smoked. He has never used smokeless tobacco.  He reports that he drinks alcohol. He reports that he does not use drugs.  Allergies: No Known Allergies  Medications: Prior to Admission medications   Medication Sig Start Date End Date Taking? Authorizing Provider  amLODipine (NORVASC) 5 MG tablet Take 1 tablet (5 mg total) by mouth daily. 11/12/15  Yes Fransisca Kaufmann Dettinger, MD  atorvastatin (LIPITOR) 80 MG tablet Take 1 tablet (80 mg total) by mouth daily. Patient taking differently: Take 80 mg by mouth at bedtime.  11/12/15  Yes Fransisca Kaufmann Dettinger, MD  Coenzyme Q10-Vitamin E (QUNOL ULTRA  COQ10 PO) Take by mouth daily.   Yes Historical Provider, MD  Cyanocobalamin (VITAMIN B-12 PO) Take 1 tablet by mouth daily.   Yes Historical Provider, MD  escitalopram (LEXAPRO) 20 MG tablet Take 1 tablet (20 mg total) by mouth daily. 07/25/16  Yes Fransisca Kaufmann Dettinger, MD  Multiple Vitamin (MULTIVITAMIN WITH MINERALS) TABS tablet Take 1 tablet by mouth at bedtime.   Yes Historical Provider, MD  omeprazole (PRILOSEC) 20 MG capsule Take 1 capsule (20 mg total) by mouth daily. 11/12/15  Yes Fransisca Kaufmann Dettinger, MD  Polyvinyl Alcohol-Povidone (REFRESH OP) Place 1 drop into both eyes 2 (two) times daily as needed (dry eyes).   Yes Historical Provider, MD  Pseudoephedrine HCl (SUDAFED 12 HOUR PO) Take 1 tablet by mouth at bedtime as needed (sinus congestion).   Yes Historical Provider, MD  rivaroxaban (XARELTO) 20 MG TABS tablet Take 1 tablet (20 mg total) by mouth daily with supper. Patient taking differently: Take 15 mg by mouth 2 (two) times daily.  09/16/16  Yes Fransisca Kaufmann Dettinger, MD  sildenafil (VIAGRA) 100 MG tablet Take 0.5-1 tablets (50-100 mg total) by mouth daily as needed for erectile dysfunction. Patient taking differently: Take 50 mg by mouth daily as needed for erectile dysfunction.  07/27/16  Yes Mary-Margaret Hassell Done, FNP  TURMERIC PO Take 3 capsules by mouth daily.   Yes Historical Provider, MD  valsartan-hydrochlorothiazide (DIOVAN-HCT) 320-12.5 MG tablet Take 1 tablet by mouth daily. 11/12/15  Yes Fransisca Kaufmann Dettinger, MD    Scheduled Meds: . aspirin EC  81 mg Oral Daily  . atorvastatin  80 mg Oral Daily  . escitalopram  20 mg Oral Daily  . pantoprazole  40 mg Oral BID  . polyvinyl alcohol  1 drop Both Eyes BID  . rivaroxaban  15 mg Oral BID  . [START ON 10/05/2016] rivaroxaban  20 mg Oral Daily   Continuous Infusions: . sodium chloride 75 mL/hr at 09/27/16 0303   PRN Meds:.acetaminophen **OR** acetaminophen (TYLENOL) oral liquid 160 mg/5 mL **OR** acetaminophen, hydrALAZINE,  senna-docusate     Results for orders placed or performed during the hospital encounter of 09/25/16 (from the past 48 hour(s))  CBG monitoring, ED     Status: Abnormal   Collection Time: 09/25/16  1:41 PM  Result Value Ref Range   Glucose-Capillary 101 (H) 65 - 99 mg/dL  Protime-INR     Status: Abnormal   Collection Time: 09/25/16  1:42 PM  Result Value Ref Range   Prothrombin Time 24.2 (H) 11.4 - 15.2 seconds   INR 2.13   APTT     Status: None   Collection Time: 09/25/16  1:42 PM  Result Value Ref Range   aPTT 36 24 - 36 seconds  CBC     Status: Abnormal   Collection Time: 09/25/16  1:42 PM  Result Value Ref Range   WBC 6.9 4.0 - 10.5 K/uL   RBC 4.27  4.22 - 5.81 MIL/uL   Hemoglobin 12.9 (L) 13.0 - 17.0 g/dL   HCT 38.3 (L) 39.0 - 52.0 %   MCV 89.7 78.0 - 100.0 fL   MCH 30.2 26.0 - 34.0 pg   MCHC 33.7 30.0 - 36.0 g/dL   RDW 13.2 11.5 - 15.5 %   Platelets 206 150 - 400 K/uL  Differential     Status: None   Collection Time: 09/25/16  1:42 PM  Result Value Ref Range   Neutrophils Relative % 67 %   Neutro Abs 4.7 1.7 - 7.7 K/uL   Lymphocytes Relative 21 %   Lymphs Abs 1.4 0.7 - 4.0 K/uL   Monocytes Relative 10 %   Monocytes Absolute 0.7 0.1 - 1.0 K/uL   Eosinophils Relative 2 %   Eosinophils Absolute 0.2 0.0 - 0.7 K/uL   Basophils Relative 0 %   Basophils Absolute 0.0 0.0 - 0.1 K/uL  Comprehensive metabolic panel     Status: Abnormal   Collection Time: 09/25/16  1:42 PM  Result Value Ref Range   Sodium 134 (L) 135 - 145 mmol/L   Potassium 3.8 3.5 - 5.1 mmol/L   Chloride 99 (L) 101 - 111 mmol/L   CO2 29 22 - 32 mmol/L   Glucose, Bld 110 (H) 65 - 99 mg/dL   BUN 19 6 - 20 mg/dL   Creatinine, Ser 1.00 0.61 - 1.24 mg/dL   Calcium 10.2 8.9 - 10.3 mg/dL   Total Protein 7.2 6.5 - 8.1 g/dL   Albumin 4.0 3.5 - 5.0 g/dL   AST 26 15 - 41 U/L   ALT 25 17 - 63 U/L   Alkaline Phosphatase 106 38 - 126 U/L   Total Bilirubin 0.7 0.3 - 1.2 mg/dL   GFR calc non Af Amer >60 >60  mL/min   GFR calc Af Amer >60 >60 mL/min    Comment: (NOTE) The eGFR has been calculated using the CKD EPI equation. This calculation has not been validated in all clinical situations. eGFR's persistently <60 mL/min signify possible Chronic Kidney Disease.    Anion gap 6 5 - 15  I-stat troponin, ED     Status: Abnormal   Collection Time: 09/25/16  2:19 PM  Result Value Ref Range   Troponin i, poc 1.01 (HH) 0.00 - 0.08 ng/mL   Comment NOTIFIED PHYSICIAN    Comment 3            Comment: Due to the release kinetics of cTnI, a negative result within the first hours of the onset of symptoms does not rule out myocardial infarction with certainty. If myocardial infarction is still suspected, repeat the test at appropriate intervals.   I-Stat Troponin, ED (not at [facility])     Status: Abnormal   Collection Time: 09/25/16  3:23 PM  Result Value Ref Range   Troponin i, poc 1.06 (HH) 0.00 - 0.08 ng/mL   Comment NOTIFIED PHYSICIAN    Comment 3            Comment: Due to the release kinetics of cTnI, a negative result within the first hours of the onset of symptoms does not rule out myocardial infarction with certainty. If myocardial infarction is still suspected, repeat the test at appropriate intervals.   Troponin I (q 6hr x 3)     Status: Abnormal   Collection Time: 09/25/16  9:41 PM  Result Value Ref Range   Troponin I 1.23 (HH) <0.03 ng/mL    Comment: CRITICAL RESULT  CALLED TO, READ BACK BY AND VERIFIED WITH: STOCUM,B. AT 2305 ON 09/25/2016 BY EVA   Troponin I (q 6hr x 3)     Status: Abnormal   Collection Time: 09/26/16  4:17 AM  Result Value Ref Range   Troponin I 1.29 (HH) <0.03 ng/mL    Comment: CRITICAL RESULT CALLED TO, READ BACK BY AND VERIFIED WITH: STOCUM,B AT 6:55AM ON 09/26/16 BY FESTERMAN,C   Hemoglobin A1c     Status: None   Collection Time: 09/26/16  4:17 AM  Result Value Ref Range   Hgb A1c MFr Bld 5.3 4.8 - 5.6 %    Comment: (NOTE)         Pre-diabetes: 5.7 -  6.4         Diabetes: >6.4         Glycemic control for adults with diabetes: <7.0    Mean Plasma Glucose 105 mg/dL    Comment: (NOTE) Performed At: [facility] [place], [place] [id number] Lindon Romp MD WU:[id number]   Lipid panel     Status: Abnormal   Collection Time: 09/26/16  4:17 AM  Result Value Ref Range   Cholesterol 95 0 - 200 mg/dL   Triglycerides 86 <150 mg/dL   HDL 29 (L) >40 mg/dL   Total CHOL/HDL Ratio 3.3 RATIO   VLDL 17 0 - 40 mg/dL   LDL Cholesterol 49 0 - 99 mg/dL    Comment:        Total Cholesterol/HDL:CHD Risk Coronary Heart Disease Risk Table                     Men   Women  1/2 Average Risk   3.4   3.3  Average Risk       5.0   4.4  2 X Average Risk   9.6   7.1  3 X Average Risk  23.4   11.0        Use the calculated Patient Ratio above and the CHD Risk Table to determine the patient's CHD Risk.        ATP III CLASSIFICATION (LDL):  <100     mg/dL   Optimal  100-129  mg/dL   Near or Above                    Optimal  130-159  mg/dL   Borderline  160-189  mg/dL   High  >190     mg/dL   Very High   Urinalysis, Routine w reflex microscopic     Status: Abnormal   Collection Time: 09/27/16 12:35 AM  Result Value Ref Range   Color, Urine YELLOW YELLOW   APPearance CLEAR CLEAR   Specific Gravity, Urine 1.019 1.005 - 1.030   pH 5.0 5.0 - 8.0   Glucose, UA NEGATIVE NEGATIVE mg/dL   Hgb urine dipstick MODERATE (A) NEGATIVE   Bilirubin Urine NEGATIVE NEGATIVE   Ketones, ur NEGATIVE NEGATIVE mg/dL   Protein, ur NEGATIVE NEGATIVE mg/dL   Nitrite NEGATIVE NEGATIVE   Leukocytes, UA NEGATIVE NEGATIVE   RBC / HPF 0-5 0 - 5 RBC/hpf   WBC, UA 0-5 0 - 5 WBC/hpf   Bacteria, UA RARE (A) NONE SEEN   Mucous PRESENT   CBC     Status: Abnormal   Collection Time: 09/27/16  4:40 AM  Result Value Ref Range   WBC 6.5 4.0 - 10.5 K/uL   RBC 3.85 (L) 4.22 - 5.81 MIL/uL  Hemoglobin 11.7 (L) 13.0 - 17.0 g/dL   HCT 34.4 (L) 39.0 - 52.0  %   MCV 89.4 78.0 - 100.0 fL   MCH 30.4 26.0 - 34.0 pg   MCHC 34.0 30.0 - 36.0 g/dL   RDW 13.1 11.5 - 15.5 %   Platelets 171 150 - 400 K/uL  Basic metabolic panel     Status: Abnormal   Collection Time: 09/27/16  4:40 AM  Result Value Ref Range   Sodium 138 135 - 145 mmol/L   Potassium 3.6 3.5 - 5.1 mmol/L   Chloride 105 101 - 111 mmol/L   CO2 27 22 - 32 mmol/L   Glucose, Bld 109 (H) 65 - 99 mg/dL   BUN 17 6 - 20 mg/dL   Creatinine, Ser 0.86 0.61 - 1.24 mg/dL   Calcium 9.2 8.9 - 10.3 mg/dL   GFR calc non Af Amer >60 >60 mL/min   GFR calc Af Amer >60 >60 mL/min    Comment: (NOTE) The eGFR has been calculated using the CKD EPI equation. This calculation has not been validated in all clinical situations. eGFR's persistently <60 mL/min signify possible Chronic Kidney Disease.    Anion gap 6 5 - 15  Troponin I     Status: Abnormal   Collection Time: 09/27/16  4:40 AM  Result Value Ref Range   Troponin I 0.57 (HH) <0.03 ng/mL    Comment: CRITICAL VALUE NOTED.  VALUE IS CONSISTENT WITH PREVIOUSLY REPORTED AND CALLED VALUE.  Sedimentation rate     Status: None   Collection Time: 09/27/16  4:40 AM  Result Value Ref Range   Sed Rate 13 0 - 16 mm/hr    Studies/Results:  TTE - Left ventricle: The cavity size was normal. Wall thickness was   normal. Systolic function was normal. The estimated ejection   fraction was in the range of 60% to 65%. Wall motion was normal;   there were no regional wall motion abnormalities. Left   ventricular diastolic function parameters were normal. - Aortic valve: Calcified non coronary cusp. There was mild   regurgitation. Valve area (VTI): 2.7 cm^2. Valve area (Vmax):   2.66 cm^2. Valve area (Vmean): 2.6 cm^2. - Mitral valve: Calcified annulus. Mildly thickened leaflets .   There was mild regurgitation. - Left atrium: The atrium was mildly dilated. - Atrial septum: No defect or patent foramen ovale was identified.   Echo contrast study showed no  right-to-left atrial level shunt,   at baseline or with provocation.    CAROTID DOPPLERS Minimal atherosclerotic disease in the carotid arteries. No significant carotid artery stenosis. Estimated degree of stenosis in the internal carotid arteries is less than 50% bilaterally.  Patent vertebral arteries with antegrade flow.        BRAIN MRI/MRA FINDINGS: MRI HEAD FINDINGS  Brain: There is scattered punctate acute infarctions scattered throughout all vascular territories of the anterior and posterior circulation. These are most extensive in the left MCA territory were there are dozens of small discrete infarctions. No large confluent infarction. The findings are consistent with embolic infarctions from the heart or ascending aorta. The brain does not show a background pattern of chronic disease. No evidence of mass lesion, hemorrhage, hydrocephalus or extra-axial collection.  Vascular: Major vessels at the base of the brain show flow.  Skull and upper cervical spine: Negative  Sinuses/Orbits: Clear/normal  Other: None  MRA HEAD FINDINGS  Both internal carotid arteries are widely patent into the brain. The anterior and middle cerebral  vessels are patent without proximal stenosis, aneurysm or vascular malformation. There is probably an occluded M2 branch of the left middle cerebral artery. No posterior circulation large or medium vessel abnormality is seen. Incidental vertebral fenestration.  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 Most extensive in the region of involvement is within the left MCA territory where there appears to be a missing left M2 branch. No large confluent infarction however. No hemorrhage, swelling or mass effect.  No pre-existing brain disease identified.       The brain MRI is reviewed in  person. There are multiple scattered bilateral increased signal seen on DWI involving the cerebrum and cerebellum. Most of these are scattered small and moderate size lesions concentrated mostly in the left temporal parietal areas. The left occipital region is also involved.   Santiana Glidden A. [name], M.D.  Diplomate, Tax adviser of Psychiatry and Neurology ( Neurology). 09/27/2016, 7:40 AM

## 2016-09-28 ENCOUNTER — Inpatient Hospital Stay (HOSPITAL_COMMUNITY): Payer: Medicare Other

## 2016-09-28 ENCOUNTER — Encounter: Payer: Medicare Other | Admitting: Internal Medicine

## 2016-09-28 DIAGNOSIS — I749 Embolism and thrombosis of unspecified artery: Secondary | ICD-10-CM

## 2016-09-28 DIAGNOSIS — R591 Generalized enlarged lymph nodes: Secondary | ICD-10-CM

## 2016-09-28 LAB — HIV ANTIBODY (ROUTINE TESTING W REFLEX): HIV Screen 4th Generation wRfx: NONREACTIVE

## 2016-09-28 LAB — HOMOCYSTEINE: HOMOCYSTEINE-NORM: 6.2 umol/L (ref 0.0–15.0)

## 2016-09-28 LAB — RPR: RPR Ser Ql: NONREACTIVE

## 2016-09-28 LAB — FANA STAINING PATTERNS: Speckled Pattern: 1:1280 {titer}

## 2016-09-28 LAB — ANTINUCLEAR ANTIBODIES, IFA: ANA Ab, IFA: POSITIVE — AB

## 2016-09-28 MED ORDER — IOPAMIDOL (ISOVUE-300) INJECTION 61%
100.0000 mL | Freq: Once | INTRAVENOUS | Status: AC | PRN
  Administered 2016-09-28: 100 mL via INTRAVENOUS

## 2016-09-28 MED ORDER — IOPAMIDOL (ISOVUE-300) INJECTION 61%
INTRAVENOUS | Status: AC
  Administered 2016-09-28: 30 mL
  Filled 2016-09-28: qty 30

## 2016-09-28 NOTE — Discharge Instructions (Signed)
Information on my medicine - ELIQUIS (apixaban)  This medication education was reviewed with me or my healthcare representative as part of my discharge preparation.  Why was Eliquis prescribed for you? Eliquis was prescribed to treat blood clots that may have been found in the veins of your legs (deep vein thrombosis) or in your lungs (pulmonary embolism) and to reduce the risk of them occurring again.  What do You need to know about Eliquis ? The dose is 5mg  taken TWICE daily.  The Doctor has switched you from Xarelto to Eliquis.  Eliquis may be taken with or without food.   Try to take the dose about the same time in the morning and in the evening. If you have difficulty swallowing the tablet whole please discuss with your pharmacist how to take the medication safely.  Take Eliquis exactly as prescribed and DO NOT stop taking Eliquis without talking to the doctor who prescribed the medication.  Stopping may increase your risk of developing a new blood clot.  Refill your prescription before you run out.  After discharge, you should have regular check-up appointments with your healthcare provider that is prescribing your Eliquis.    What do you do if you miss a dose? If a dose of ELIQUIS is not taken at the scheduled time, take it as soon as possible on the same day and twice-daily administration should be resumed. The dose should not be doubled to make up for a missed dose.  Important Safety Information A possible side effect of Eliquis is bleeding. You should call your healthcare provider right away if you experience any of the following: ? Bleeding from an injury or your nose that does not stop. ? Unusual colored urine (red or dark brown) or unusual colored stools (red or black). ? Unusual bruising for unknown reasons. ? A serious fall or if you hit your head (even if there is no bleeding).  Some medicines may interact with Eliquis and might increase your risk of bleeding or  clotting while on Eliquis. To help avoid this, consult your healthcare provider or pharmacist prior to using any new prescription or non-prescription medications, including herbals, vitamins, non-steroidal anti-inflammatory drugs (NSAIDs) and supplements.  This website has more information on Eliquis (apixaban): http://www.eliquis.com/eliquis/home

## 2016-09-28 NOTE — Care Management Important Message (Signed)
Important Message  Patient Details  Name: [name] MRN: [id number] Date of Birth: Jan 04, 1951   Medicare Important Message Given:  Yes    E[place]aranza Krider, Chauncey Reading, RN 09/28/2016, 4:28 PM

## 2016-09-28 NOTE — Progress Notes (Addendum)
PROGRESS NOTE    Keno Tuckerman  [id number] DOB: October 30, 1950 DOA: 09/25/2016 PCP: Fransisca Kaufmann Dettinger, MD    Brief Narrative:  66 y/o ? 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 TURP in [place] ~2010 Bipolar, GERD, Probable OSA. Premature CAD-Stress test EF 56%, low risk study 10/23/15  Admit from home am 09/25/16 with slow speech and mild confusion-could not tell "where his room was". Having some word-finding difficulty. Was shuffling in gait, less talkative than usual. more slow to speak acc to wife aware of where he is but much slower to repsond than normal  CT head neg. MRI brain confirmed multiple areas of infarct-? Embolic source  CT of Abd/ Pelvis with contrast showing Abdominal lymphadenopathy in retroperitoneum, porta hepatis, gastrohepatic and gastrosplenic ligaments, and inferior mediastinum. Differential diagnosis includes metastatic disease and lymphoma. Mildly enlarged prostate, with focal contrast enhancement in the right anterior mid gland which may be due to prostatitis or prostate carcinoma. No pelvic lymphadenopathy identified.   Assessment & Plan:   Active Problems:   Stroke ([place])  EmbolicCVA - CT head neg - Switched to Eliquis - Not candidate on admit TPA - Neurology consulted-appreciate input -  Note that CT angiogram of aortic arch did not show aortic source that there was significant lymphadenopathy in the lower abdominal viscera  - CT abd/ pelvis done today- diffuse lymphadenopathy - echo did not show right-to-left shunting -vasculitis/dementia labs have been ordered including homocystine sedimentation rate CRP B12 ANA HIV RPR  Elevated troponin without injury pattern on EKG -rpt I-stat troponin is + -this could be a marker of occult malignancy - EKG benign - no cp -no current further work-up   Recent LLE DVt/ bilateral PE 09/11/15 -continue Elliquis> xarelto   Chronic diastolic dysfunction -based on echo 1/22-no acute  symptoms  GERD -protonix 40 bid   Htn-hold amlodipine and Valsartan HCTZ - slowly resuming amlodipine 5 daily  Depression-cont lexapro   DVT prophylaxis: Eliquis Code Status: Full code Family Communication: wife is bedside Disposition Plan: will likely discharge home in the next 24-48 hours   Consultants:   Cardiology  Neurology  Procedures:   TTE  Antimicrobials:   none    Subjective: Patient drinking contrast for CT Abd/ Pelvis.  Patient denies any pain but is having significant speech difficulties.  Ambulating better per his wife. Wife is asking many questions about results of CT Abdomen/ Pelvis.  Objective: Vitals:   09/28/16 0730 09/28/16 1130 09/28/16 1526 09/28/16 1530  BP: 131/62 120/65 133/71 135/70  Pulse: 67 70 70 72  Resp: 16  18 18   Temp: 97.6 F (36.4 C) 97.2 F (36.2 C) 97.8 F (36.6 C) 97.8 F (36.6 C)  TempSrc: Oral Oral Oral Oral  SpO2: 96% 97% 97% 98%  Weight:      Height:        Intake/Output Summary (Last 24 hours) at 09/28/16 1726 Last data filed at 09/28/16 1500  Gross per 24 hour  Intake           3342.5 ml  Output             1400 ml  Net           1942.5 ml   Filed Weights   09/25/16 1351  Weight: 106.6 kg (235 lb)    Examination:  General exam: Appears calm and comfortable  Respiratory system: Clear to auscultation. Respiratory effort normal. Cardiovascular system: S1 & S2 heard, RRR. No JVD, murmurs, rubs,  gallops or clicks. No pedal edema. Gastrointestinal system: Abdomen is nondistended, soft and nontender. No organomegaly or masses felt. Hypoactive bowel sounds heard. Central nervous system: Alert could not assess orientation due to speech difficulty. No focal neurological deficits aside from speech Extremities: Symmetric 4 x 5 power. Skin: No rashes, lesions or ulcers Psychiatry: Mood & affect appropriate.     Data Reviewed: I have personally reviewed following labs and imaging studies  CBC:  Recent  Labs Lab 09/25/16 1342 09/27/16 0440  WBC 6.9 6.5  NEUTROABS 4.7  --   HGB 12.9* 11.7*  HCT 38.3* 34.4*  MCV 89.7 89.4  PLT 206 [id number]   Basic Metabolic Panel:  Recent Labs Lab 09/25/16 1342 09/27/16 0440  NA 134* 138  K 3.8 3.6  CL 99* 105  CO2 29 27  GLUCOSE 110* 109*  BUN 19 17  CREATININE 1.00 0.86  CALCIUM 10.2 9.2   GFR: Estimated Creatinine Clearance: 106.8 mL/min (by C-G formula based on SCr of 0.86 mg/dL). Liver Function Tests:  Recent Labs Lab 09/25/16 1342  AST 26  ALT 25  ALKPHOS 106  BILITOT 0.7  PROT 7.2  ALBUMIN 4.0   No results for input(s): LIPASE, AMYLASE in the last 168 hours. No results for input(s): AMMONIA in the last 168 hours. Coagulation Profile:  Recent Labs Lab 09/25/16 1342  INR 2.13   Cardiac Enzymes:  Recent Labs Lab 09/25/16 2141 09/26/16 0417 09/27/16 0440  TROPONINI 1.23* 1.29* 0.57*   BNP (last 3 results) No results for input(s): PROBNP in the last 8760 hours. HbA1C:  Recent Labs  09/26/16 0417  HGBA1C 5.3   CBG:  Recent Labs Lab 09/25/16 1341  GLUCAP 101*   Lipid Profile:  Recent Labs  09/26/16 0417  CHOL 95  HDL 29*  LDLCALC 49  TRIG 86  CHOLHDL 3.3   Thyroid Function Tests: No results for input(s): TSH, T4TOTAL, FREET4, T3FREE, THYROIDAB in the last 72 hours. Anemia Panel:  Recent Labs  09/27/16 1351  VITAMINB12 1,344*   Sepsis Labs: No results for input(s): PROCALCITON, LATICACIDVEN in the last 168 hours.  No results found for this or any previous visit (from the past 240 hour(s)).       Radiology Studies: Ct Abdomen Pelvis W Contrast  Result Date: 09/28/2016 CLINICAL DATA:  Abdominal lymphadenopathy. EXAM: CT ABDOMEN AND PELVIS WITH CONTRAST TECHNIQUE: Multidetector CT imaging of the abdomen and pelvis was performed using the standard protocol following bolus administration of intravenous contrast. CONTRAST:  158mL ISOVUE-300 IOPAMIDOL (ISOVUE-300) INJECTION 61% COMPARISON:   Chest CTA on 09/27/2016 FINDINGS: Lower Chest: Tiny left pleural effusion and left basilar atelectasis. Mild lymphadenopathy in the inferior mediastinum along the posterior aspect of the distal esophagus. Hepatobiliary:  No masses identified. Gallbladder is unremarkable. Pancreas:  No mass or inflammatory changes. Spleen: Within normal limits in size and appearance. Adrenals/Urinary Tract: No masses identified. No evidence of hydronephrosis. Stomach/Bowel: No evidence of obstruction, inflammatory process or abnormal fluid collections. Vascular/Lymphatic: Lymphadenopathy is seen in the gastrohepatic and gastrosplenic ligaments and porta hepatis, with index lymph node in the porta hepatis measure 2.7 cm on image 29/2. Mild retroperitoneal lymphadenopathy is also seen in the aorta caval and left paraaortic spaces, with index lymph node in the portacaval space are measuring 1.7 cm on image 41/2. No pathologically enlarged lymph nodes identified within the pelvis. Aortic atherosclerosis.  No abdominal aortic aneurysm. Reproductive: Mildly enlarged prostate, with focal contrast enhancement in right anterior mid gland, which may be due to prostatitis  or prostate carcinoma. Normal appearance of seminal vesicles. Other:  None. Musculoskeletal:  No suspicious bone lesions identified. IMPRESSION: Abdominal lymphadenopathy in retroperitoneum, porta hepatis, gastrohepatic and gastrosplenic ligaments, and inferior mediastinum. Differential diagnosis includes metastatic disease and lymphoma. Mildly enlarged prostate, with focal contrast enhancement in the right anterior mid gland which may be due to prostatitis or prostate carcinoma. No pelvic lymphadenopathy identified. Electronically Signed   By: Earle Gell M.D.   On: 09/28/2016 15:53   Ct Angio Chest Aorta W/cm &/or Wo/cm  Result Date: 09/27/2016 CLINICAL DATA:  New CVA. Questionable aortic dilation on transthoracic echo. EXAM: CT ANGIOGRAPHY CHEST WITH CONTRAST TECHNIQUE:  Multidetector CT imaging of the chest was performed using the standard protocol during bolus administration of intravenous contrast. Multiplanar CT image reconstructions and MIPs were obtained to evaluate the vascular anatomy. CONTRAST:  100 cc Isovue 370 intravenously. COMPARISON:  None. FINDINGS: Cardiovascular: Satisfactory opacification of the pulmonary arteries to the segmental level. Filling defects within the pulmonary arterial branch to the left lower lobe and segmental pulmonary arterial branch in the left lung base likely represents residual pulmonary emboli. The remaining of the pulmonary arteries are normally opacified. Normal heart size. No pericardial effusion. The ascending thoracic aorta is mildly dilated measuring 4.4 cm in diameter. Mediastinum/Nodes: No enlarged mediastinal, hilar, or axillary lymph nodes. Thyroid gland, trachea, and esophagus demonstrate no significant findings. Small collection of lymph nodes versus paraesophageal varices is seen in the lower thorax. Lungs/Pleura: Persistent left lower lobe airspace consolidation versus atelectasis or hypoperfusion changes. Upper Abdomen: Soft tissue masses seen within the gastrohepatic ligament, porta hepaticus and in the left upper quadrant along the splenic artery. Musculoskeletal: No chest wall abnormality. No acute or significant osseous findings. Review of the MIP images confirms the above findings. 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 Recommend followup by ultrasound in 3 years. This recommendation follows ACR consensus guidelines: White Paper of the ACR Incidental Findings Committee II on Vascular Findings. J Am Coll Radiol 2013; [phone] Residual chronic embolic disease in the left lower lobar pulmonary artery and segmental basilar left lower lobe pulmonary artery. Left lower lobe airspace consolidation, atelectasis or hypoperfusion changes. Nodular left  pleural thickening. This may represent loculated pleural effusion, however pleural metastatic disease cannot be excluded. Soft tissue masses in gastrohepatic ligament, porta hepaticus and retroperitoneum of the upper abdomen, which may represent lymphadenopathy. Further evaluation with abdomen and pelvis CT with contrast is recommended. Periesophageal lymph nodes versus varices. These results were called by telephone at the time of interpretation on 09/27/2016 at 11:30 am to Dr. Nita Sells , who verbally acknowledged these results. Electronically Signed   By: Fidela Salisbury M.D.   On: 09/27/2016 11:40        Scheduled Meds: . amLODipine  5 mg Oral Daily  . apixaban  5 mg Oral BID  . aspirin EC  81 mg Oral Daily  . atorvastatin  80 mg Oral Daily  . escitalopram  20 mg Oral Daily  . pantoprazole  40 mg Oral BID  . polyvinyl alcohol  1 drop Both Eyes BID   Continuous Infusions: . sodium chloride 75 mL/hr at 09/28/16 1349     LOS: 3 days    Time spent: 30 minutes    Loretha Stapler, MD [facility] Pager [phone]  If 7PM-7AM, please contact night-coverage [email or web address] Password [facility] 09/28/2016, 5:26 PM

## 2016-09-28 NOTE — Care Management Note (Addendum)
Case Management Note  Patient Details  Name: [name] MRN: [id number] Date of Birth: 04-Jun-1951  Subjective/Objective:    Patient adm with CVA. Recommended for OP PT/OT/SLP. CM spoke with wife and patient. Both agreeable with OP services. Patient lives In [place], closest facility that offers all three services is [facility]. Electronic Referral ordered.  Patient started on Eliquis this admission. CM gave 30 day free coupon.              Action/Plan: No other CM needs at this time. Will follow for evolving needs.    Expected Discharge Date:      09/29/2016           Expected Discharge Plan:  Home/Self Care (with OP PT/OT/SLP)  In-House Referral:     Discharge planning Services  CM Consult  Post Acute Care Choice:    Choice offered to:  Patient  DME Arranged:    DME Agency:     HH Arranged:    [place] Agency:     Status of Service:  In process, will continue to follow  If discussed at Long Length of Stay Meetings, dates discussed:    Additional Comments:  Catelin Manthe, Chauncey Reading, RN 09/28/2016, 12:08 PM

## 2016-09-28 NOTE — Progress Notes (Signed)
[facility] A. [name], MD     [email or web address]          [name] is an 66 y.o. male.   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 The workup does not suggest a vasculitis at this time.   Switched his Xarelto to eliquis. Again the patient should have a loop recorder.   FU with me 4 weeks        GENERAL: This is a pleasant obese man in no acute distress.  HEENT: Supple. Atraumatic normocephalic.   ABDOMEN: soft  EXTREMITIES: No edema   BACK: Normal.  SKIN: Normal by inspection.    MENTAL STATUS: The patient is somewhat less fluent in his bedside naming testing and fluency. He was only able to name 3/5 objects with prompting today. Again he continues to have marked impairment with the following see and comprehension. Today he seemed to have significant perseveration.  CRANIAL NERVES: Pupils are equal, round and reactive to light and accommodation; extra ocular movements are full, there is no significant nystagmus; He has a mild flattening of the nasolabial fold on the left side. The right homonymous hemianopia symptoms improve and today he seems to have more of a neglect involving the right visual field; tongue is midline; uvula is midline; shoulder elevation is normal.  MOTOR: Normal tone, bulk and strength; no pronator drift. There are no rest of the legs or upper extremities.  COORDINATION: There is no clear evidence of dysmetria, No rest tremor; no intention tremor; no postural tremor; no bradykinesia.       Blood pressure 131/62, pulse 67, temperature 97.6 F (36.4 C), temperature source Oral, resp. rate 16, height 6' 1"  (1.854 m), weight 235 lb (106.6 kg), SpO2 96 %.  Past Medical History:  Diagnosis Date  . Depression   . DVT (deep venous thrombosis) ([place])   . GERD (gastroesophageal reflux disease)   . 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 Hyperlipidemia   . Hypertension   . PE  (pulmonary thromboembolism) ([place])     Past Surgical History:  Procedure Laterality Date  . adnoidectomy    . APPENDECTOMY    . PROSTATE SURGERY    . TONSILLECTOMY AND ADENOIDECTOMY      Family History  Problem Relation Age of Onset  . COPD Mother   . Heart disease Mother   . AAA (abdominal aortic aneurysm) Mother   . Heart disease Father   . Depression Father   . Diabetes Father   . Heart disease Brother   . Early death Brother     heart attack  . Heart disease Maternal Grandmother   . Heart disease Maternal Grandfather   . Stroke Paternal Grandmother   . Heart disease Brother   . Diabetes Brother     Social History:  reports that he has never smoked. He has never used smokeless tobacco. He reports that he drinks alcohol. He reports that he does not use drugs.  Allergies: No Known Allergies  Medications: Prior to Admission medications   Medication Sig Start Date End Date Taking? Authorizing Provider  amLODipine (NORVASC) 5 MG tablet Take 1 tablet (5 mg total) by mouth daily. 11/12/15  Yes Fransisca Kaufmann Dettinger, MD  atorvastatin (LIPITOR) 80 MG tablet Take 1 tablet (80 mg total) by mouth daily. Patient taking differently: Take 80 mg by mouth at bedtime.  11/12/15  Yes Fransisca Kaufmann Dettinger, MD  Coenzyme Q10-Vitamin E (QUNOL ULTRA COQ10 PO) Take by  mouth daily.   Yes Historical Provider, MD  Cyanocobalamin (VITAMIN B-12 PO) Take 1 tablet by mouth daily.   Yes Historical Provider, MD  escitalopram (LEXAPRO) 20 MG tablet Take 1 tablet (20 mg total) by mouth daily. 07/25/16  Yes Fransisca Kaufmann Dettinger, MD  Multiple Vitamin (MULTIVITAMIN WITH MINERALS) TABS tablet Take 1 tablet by mouth at bedtime.   Yes Historical Provider, MD  omeprazole (PRILOSEC) 20 MG capsule Take 1 capsule (20 mg total) by mouth daily. 11/12/15  Yes Fransisca Kaufmann Dettinger, MD  Polyvinyl Alcohol-Povidone (REFRESH OP) Place 1 drop into both eyes 2 (two) times daily as needed (dry eyes).   Yes Historical Provider, MD    Pseudoephedrine HCl (SUDAFED 12 HOUR PO) Take 1 tablet by mouth at bedtime as needed (sinus congestion).   Yes Historical Provider, MD  rivaroxaban (XARELTO) 20 MG TABS tablet Take 1 tablet (20 mg total) by mouth daily with supper. Patient taking differently: Take 15 mg by mouth 2 (two) times daily.  09/16/16  Yes Fransisca Kaufmann Dettinger, MD  sildenafil (VIAGRA) 100 MG tablet Take 0.5-1 tablets (50-100 mg total) by mouth daily as needed for erectile dysfunction. Patient taking differently: Take 50 mg by mouth daily as needed for erectile dysfunction.  07/27/16  Yes Mary-Margaret Hassell Done, FNP  TURMERIC PO Take 3 capsules by mouth daily.   Yes Historical Provider, MD  valsartan-hydrochlorothiazide (DIOVAN-HCT) 320-12.5 MG tablet Take 1 tablet by mouth daily. 11/12/15  Yes Fransisca Kaufmann Dettinger, MD    Scheduled Meds: . amLODipine  5 mg Oral Daily  . apixaban  5 mg Oral BID  . aspirin EC  81 mg Oral Daily  . atorvastatin  80 mg Oral Daily  . escitalopram  20 mg Oral Daily  . pantoprazole  40 mg Oral BID  . polyvinyl alcohol  1 drop Both Eyes BID   Continuous Infusions: . sodium chloride 75 mL/hr at 09/27/16 2359   PRN Meds:.acetaminophen **OR** acetaminophen (TYLENOL) oral liquid 160 mg/5 mL **OR** acetaminophen, hydrALAZINE, senna-docusate     Results for orders placed or performed during the hospital encounter of 09/25/16 (from the past 48 hour(s))  Urinalysis, Routine w reflex microscopic     Status: Abnormal   Collection Time: 09/27/16 12:35 AM  Result Value Ref Range   Color, Urine YELLOW YELLOW   APPearance CLEAR CLEAR   Specific Gravity, Urine 1.019 1.005 - 1.030   pH 5.0 5.0 - 8.0   Glucose, UA NEGATIVE NEGATIVE mg/dL   Hgb urine dipstick MODERATE (A) NEGATIVE   Bilirubin Urine NEGATIVE NEGATIVE   Ketones, ur NEGATIVE NEGATIVE mg/dL   Protein, ur NEGATIVE NEGATIVE mg/dL   Nitrite NEGATIVE NEGATIVE   Leukocytes, UA NEGATIVE NEGATIVE   RBC / HPF 0-5 0 - 5 RBC/hpf   WBC, UA 0-5 0 - 5  WBC/hpf   Bacteria, UA RARE (A) NONE SEEN   Mucous PRESENT   CBC     Status: Abnormal   Collection Time: 09/27/16  4:40 AM  Result Value Ref Range   WBC 6.5 4.0 - 10.5 K/uL   RBC 3.85 (L) 4.22 - 5.81 MIL/uL   Hemoglobin 11.7 (L) 13.0 - 17.0 g/dL   HCT 34.4 (L) 39.0 - 52.0 %   MCV 89.4 78.0 - 100.0 fL   MCH 30.4 26.0 - 34.0 pg   MCHC 34.0 30.0 - 36.0 g/dL   RDW 13.1 11.5 - 15.5 %   Platelets 171 150 - 400 K/uL  Basic metabolic panel  Status: Abnormal   Collection Time: 09/27/16  4:40 AM  Result Value Ref Range   Sodium 138 135 - 145 mmol/L   Potassium 3.6 3.5 - 5.1 mmol/L   Chloride 105 101 - 111 mmol/L   CO2 27 22 - 32 mmol/L   Glucose, Bld 109 (H) 65 - 99 mg/dL   BUN 17 6 - 20 mg/dL   Creatinine, Ser 0.86 0.61 - 1.24 mg/dL   Calcium 9.2 8.9 - 10.3 mg/dL   GFR calc non Af Amer >60 >60 mL/min   GFR calc Af Amer >60 >60 mL/min    Comment: (NOTE) The eGFR has been calculated using the CKD EPI equation. This calculation has not been validated in all clinical situations. eGFR's persistently <60 mL/min signify possible Chronic Kidney Disease.    Anion gap 6 5 - 15  Troponin I     Status: Abnormal   Collection Time: 09/27/16  4:40 AM  Result Value Ref Range   Troponin I 0.57 (HH) <0.03 ng/mL    Comment: CRITICAL VALUE NOTED.  VALUE IS CONSISTENT WITH PREVIOUSLY REPORTED AND CALLED VALUE.  Sedimentation rate     Status: None   Collection Time: 09/27/16  4:40 AM  Result Value Ref Range   Sed Rate 13 0 - 16 mm/hr  C-reactive protein     Status: None   Collection Time: 09/27/16  4:40 AM  Result Value Ref Range   CRP 0.9 <1.0 mg/dL    Comment: Performed at [facility] [address], [place], [place] [address]  RPR     Status: None   Collection Time: 09/27/16  1:39 PM  Result Value Ref Range   RPR Ser Ql Non Reactive Non Reactive    Comment: (NOTE) Performed At: [facility] [address] [place], [place] [id number] Lindon Romp MD [id number]     HIV antibody (routine testing) (NOT for [facility])     Status: None   Collection Time: 09/27/16  1:39 PM  Result Value Ref Range   HIV Screen 4th Generation wRfx Non Reactive Non Reactive    Comment: (NOTE) Performed At: [facility] [place], [place] [id number] Lindon Romp MD [id number]   Vitamin B12     Status: Abnormal   Collection Time: 09/27/16  1:51 PM  Result Value Ref Range   Vitamin B-12 1,344 (H) 180 - 914 pg/mL    Comment: (NOTE) This assay is not validated for testing neonatal or myeloproliferative syndrome specimens for Vitamin B12 levels. Performed at [facility], [place] [address] [place], [place] [id number]     Studies/Results:  TTE - Left ventricle: The cavity size was normal. Wall thickness was   normal. Systolic function was normal. The estimated ejection   fraction was in the range of 60% to 65%. Wall motion was normal;   there were no regional wall motion abnormalities. Left   ventricular diastolic function parameters were normal. - Aortic valve: Calcified non coronary cusp. There was mild   regurgitation. Valve area (VTI): 2.7 cm^2. Valve area (Vmax):   2.66 cm^2. Valve area (Vmean): 2.6 cm^2. - Mitral valve: Calcified annulus. Mildly thickened leaflets .   There was mild regurgitation. - Left atrium: The atrium was mildly dilated. - Atrial septum: No defect or patent foramen ovale was identified.   Echo contrast study showed no right-to-left atrial level shunt,   at baseline or with provocation.    CAROTID DOPPLERS Minimal atherosclerotic disease in the  carotid arteries. No significant carotid artery stenosis. Estimated degree of stenosis in the internal carotid arteries is less than 50% bilaterally.  Patent vertebral arteries with antegrade flow.        BRAIN MRI/MRA FINDINGS: MRI HEAD FINDINGS  Brain: There is scattered punctate acute infarctions scattered throughout all vascular territories of the  anterior and posterior circulation. These are most extensive in the left MCA territory were there are dozens of small discrete infarctions. No large confluent infarction. The findings are consistent with embolic infarctions from the heart or ascending aorta. The brain does not show a background pattern of chronic disease. No evidence of mass lesion, hemorrhage, hydrocephalus or extra-axial collection.  Vascular: Major vessels at the base of the brain show flow.  Skull and upper cervical spine: Negative  Sinuses/Orbits: Clear/normal  Other: None  MRA HEAD FINDINGS  Both internal carotid arteries are widely patent into the brain. The anterior and middle cerebral vessels are patent without proximal stenosis, aneurysm or vascular malformation. There is probably an occluded M2 branch of the left middle cerebral artery. No posterior circulation large or medium vessel abnormality is seen. Incidental vertebral fenestration.  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 Most extensive in the region of involvement is within the left MCA territory where there appears to be a missing left M2 branch. No large confluent infarction however. No hemorrhage, swelling or mass effect.  No pre-existing brain disease identified.       The brain MRI is reviewed in person. There are multiple scattered bilateral increased signal seen on DWI involving the cerebrum and cerebellum. Most of these are scattered small and moderate size lesions concentrated mostly in the left temporal parietal areas. The left occipital region is also involved.   Paisyn Guercio A. [name], M.D.  Diplomate, Tax adviser of Psychiatry and Neurology ( Neurology). 09/28/2016, 8:41 AM

## 2016-09-29 DIAGNOSIS — R590 Localized enlarged lymph nodes: Secondary | ICD-10-CM

## 2016-09-29 DIAGNOSIS — I824Y9 Acute embolism and thrombosis of unspecified deep veins of unspecified proximal lower extremity: Secondary | ICD-10-CM

## 2016-09-29 LAB — PSA: PSA: 1.69 ng/mL (ref 0.00–4.00)

## 2016-09-29 MED ORDER — BISACODYL 10 MG RE SUPP: 10.0000 mg | Freq: Once | RECTAL | Status: DC

## 2016-09-29 MED ORDER — APIXABAN 5 MG PO TABS: 5.0000 mg | ORAL_TABLET | Freq: Two times a day (BID) | ORAL | 1 refills | Status: DC

## 2016-09-29 NOTE — Progress Notes (Signed)
Patient is to be discharged home and in stable condition. IV and telemetry removed. Discharge instructions discussed with wife and patient, both verbalize understanding. Dicussed the importance and implications of new medication regimen and follow-up appointments. Patient and wife verbalize understanding and have no further questions or concerns at this time. Patient will be escorted out via wheelchair by staff.  Celestia Khat, RN

## 2016-09-29 NOTE — Progress Notes (Signed)
Speech Language Pathology Treatment: Cognitive-Linquistic  Patient Details Name: [name] MRN: [id number] DOB: 12/30/1950 Today's Date: 09/29/2016 Time: 1120-1210 SLP Time Calculation (min) (ACUTE ONLY): 50 min  Assessment / Plan / Recommendation Clinical Impression  Pt seen in room for expressive and receptive speech/language therapy. Pt was received sitting up in his chair in room with newspaper in front of him. Pt demonstrates improved verbal fluency and auditory comprehension compared to previous days. He is now speaking in some full sentences, but continues to present with dysnomia. Pt continues to present with decreased attention to his right as evidenced by inability to locate several items on his tray on his right side. Pt asked to complete clock drawing this date and he struggled significantly (making a complete circle, dividing the clock into four quadrants, and placing the numbers "12" and then "11" only). Pt's wife was not here for session, however SLP provided contact information should she have further questions. Recommend outpatient SLP services to increase independence with communication.   HPI HPI: Mr. Matisyahu Fewkes is a 66 yo male who presented to [facility] yesterday with sudden onset confusion and aphasia.       SLP Plan   (Discharging home today; rec outpatient SLP)     Recommendations   Outpatient SLP                Plan:  (Discharging home today; rec outpatient SLP)       Thank you,  Genene Churn, [place]                 [place] 09/29/2016, 7:08 PM

## 2016-09-29 NOTE — Progress Notes (Signed)
PT Cancellation Note  Patient Details Name: [name] MRN: [id number] DOB: 1951-06-16   Cancelled Treatment:    Reason Eval/Treat Not Completed: Other (comment) (Pt declined PT today stating, "I'm going home today and don't wish to participate".  )  Attempted PT session today, pt sitting in char and friendly.  Pt declined therapy today stated his wife told him they were going home today, did not wish to participate.  Pt left in chair with call bell with reach.    [address], LPTA; [place]  Aldona Lento 09/29/2016, 3:14 PM

## 2016-09-29 NOTE — Discharge Summary (Signed)
Physician Discharge Summary  [name] [id number] DOB: 1951-07-06 DOA: 09/25/2016  PCP: Fransisca Kaufmann Dettinger, MD  Admit date: 09/25/2016 Discharge date: 09/29/2016  Admitted From: Home Disposition:  Home   Recommendations for Outpatient Follow-up:  1. Follow up with PCP in 1-2 weeks 2. Please obtain BMP/CBC in one week 3. Please follow up on the following pending results:  Home Health:No Equipment/Devices: None  Discharge Condition: Stable CODE STATUS:Full code  Diet recommendation: Heart Healthy  Brief/Interim Summary: 66 y/o ? 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 TURP in [place] ~2010 Bipolar, GERD, Probable OSA. Premature CAD-Stress test EF 56%, low risk study 10/23/15  Admit from home am 09/25/16 with slow speech and mild confusion-could not tell "where his room was". Having some word-finding difficulty. Was shuffling in gait, less talkative than usual. more slow to speak acc to wife aware of where he is but much slower to repsond than normal  CT head neg. MRI brain confirmed multiple areas of infarct-? Embolic source  CT of Abd/ Pelvis with contrast showing Abdominal lymphadenopathy in retroperitoneum, porta hepatis, gastrohepatic and gastrosplenic ligaments, and inferior mediastinum. Differential diagnosis includes metastatic disease and lymphoma. Mildly enlarged prostate, with focal contrast enhancement in the right anterior mid gland which may be due to prostatitis or prostate carcinoma. No pelvic lymphadenopathy identified.  Patient was set up with appointment at [facility] on 2/12.  He was instructed to follow up with cardiology at previously scheduled appointment and to follow up with Dr. [name] of Neurology within 4 weeks.  Outpatient PT/OT/SLP was ordered.  Discharge Diagnoses:  Active Problems:   Stroke [facility])    Discharge Instructions  Discharge Instructions    Ambulatory referral to Physical Therapy    Complete by:  As  directed    OT and SLP rehab also   Ambulatory referral to Physical Therapy    Complete by:  As directed    OT and SLP services also   Call MD for:  difficulty breathing, headache or visual disturbances    Complete by:  As directed    Call MD for:  extreme fatigue    Complete by:  As directed    Call MD for:  hives    Complete by:  As directed    Call MD for:  persistant dizziness or light-headedness    Complete by:  As directed    Call MD for:  persistant nausea and vomiting    Complete by:  As directed    Call MD for:  severe uncontrolled pain    Complete by:  As directed    Call MD for:  temperature >100.4    Complete by:  As directed    Diet - low sodium heart healthy    Complete by:  As directed    Driving Restrictions    Complete by:  As directed    Cannot drive   Increase activity slowly    Complete by:  As directed      Allergies as of 09/29/2016   No Known Allergies     Medication List    STOP taking these medications   rivaroxaban 20 MG Tabs tablet Commonly known as:  XARELTO   sildenafil 100 MG tablet Commonly known as:  VIAGRA   valsartan-hydrochlorothiazide 320-12.5 MG tablet Commonly known as:  DIOVAN-HCT     TAKE these medications   amLODipine 5 MG tablet Commonly known as:  NORVASC Take 1 tablet (5 mg total) by mouth daily.  apixaban 5 MG Tabs tablet Commonly known as:  ELIQUIS Take 1 tablet (5 mg total) by mouth 2 (two) times daily.   atorvastatin 80 MG tablet Commonly known as:  LIPITOR Take 1 tablet (80 mg total) by mouth daily. What changed:  when to take this   escitalopram 20 MG tablet Commonly known as:  LEXAPRO Take 1 tablet (20 mg total) by mouth daily.   multivitamin with minerals Tabs tablet Take 1 tablet by mouth at bedtime.   omeprazole 20 MG capsule Commonly known as:  PRILOSEC Take 1 capsule (20 mg total) by mouth daily.   QUNOL ULTRA COQ10 PO Take by mouth daily.   REFRESH OP Place 1 drop into both eyes 2 (two)  times daily as needed (dry eyes).   SUDAFED 12 HOUR PO Take 1 tablet by mouth at bedtime as needed (sinus congestion).   TURMERIC PO Take 3 capsules by mouth daily.   VITAMIN B-12 PO Take 1 tablet by mouth daily.      Follow-up Information    Cristopher Peru, MD. Schedule an appointment as soon as possible for a visit on 10/06/2016.   Specialty:  Cardiology Why:  9:30 Contact information: [place] [address] [phone]        [place]. Go to.   Specialty:  Oncology Why:  Appointment on February 12,2018 at 2:20pm Contact information: [id number] [place]       Fransisca Kaufmann Dettinger, MD. Schedule an appointment as soon as possible for a visit in 2 week(s).   Specialties:  Family Medicine, Cardiology Contact information: [place] [address] [phone]        [facility], Trey Sailors, MD. Schedule an appointment as soon as possible for a visit in 4 week(s).   Specialty:  Neurology Contact information: [address] [place] [address] [phone]          No Known Allergies  Consultations:  Cardiology  Neurology  PT/OT  SLP  CM   Procedures/Studies: Dg Chest 2 View  Result Date: 09/10/2016 CLINICAL DATA:  Right foot pain and swelling EXAM: CHEST  2 VIEW COMPARISON:  None. FINDINGS: There is consolidation at the posterior left lung base. Subsegmental atelectasis at the right base. Small pleural effusions are suspected. Upper normal heart size. No pneumothorax. IMPRESSION: Left lower lobe pneumonia. Followup PA and lateral chest X-ray is recommended in 3-4 weeks following trial of antibiotic therapy to ensure resolution and exclude underlying malignancy. Electronically Signed   By: Marybelle Killings M.D.   On: 09/10/2016 15:25   Ct Angio Chest Pe W And/or Wo Contrast  Result Date: 09/10/2016 CLINICAL DATA:  Short of breath, concern for pulmonary  embolism. The swelling. EXAM: CT ANGIOGRAPHY CHEST WITH CONTRAST TECHNIQUE: Multidetector CT imaging of the chest was performed using the standard protocol during bolus administration of intravenous contrast. Multiplanar CT image reconstructions and MIPs were obtained to evaluate the vascular anatomy. CONTRAST:  80 mL Isovue COMPARISON:  Indications FINDINGS: Cardiovascular: Filling defect within the proximal LEFT lower lobe pulmonary artery which is elongated tubular and extends into the subsegmental branches consist with acute pulmonary embolism. Additional filling defect within the lingular pulmonary artery. Within the segmental branches of the RIGHT lower lobe there are additional filling defects. Filling defect within the RIGHT upper lobe pulmonary artery. Overall clot burden is moderate to severe. No evidence of RIGHT ventricular strain with the RIGHT ventricle to LEFT ventricle  ratio less than 1. Mediastinum/Nodes: No axillary supraclavicular adenopathy. No mediastinal adenopathy. Lungs/Pleura: Peribronchial thickening in the LEFT lower lobe. No clear evidence of infarction. No pneumonia. Small LEFT effusion. Upper Abdomen: Limited view of the liver, kidneys, pancreas are unremarkable. Normal adrenal glands. Musculoskeletal: No aggressive osseous lesion. Review of the MIP images confirms the above findings. IMPRESSION: 1. Multi lobar pulmonary emboli involving the LEFT lower lobe pulmonary arteries, RIGHT lower lobe pulmonary arteries, lingular pulmonary artery and RIGHT upper lobe pulmonary artery. 2. Overall clot burden is moderate to severe. 3. No evidence of RIGHT ventricular strain with the RIGHT ventricle ratio to LEFT ventricular ratio less than 1. 4. LEFT basilar atelectasis versus less likely infarction. Small LEFT effusion. Critical Value/emergent results were called by telephone at the time of interpretation on 09/10/2016 at 6:58 pm to Dr. Dorie Rank , who verbally acknowledged these results.  Electronically Signed   By: Suzy Bouchard M.D.   On: 09/10/2016 18:59   Mr Brain Wo Contrast  Result Date: 09/26/2016 CLINICAL DATA:  Confusion and altered mental status over the last 2 days. EXAM: MRI HEAD WITHOUT CONTRAST MRA HEAD WITHOUT CONTRAST TECHNIQUE: Multiplanar, multiecho pulse sequences of the brain and surrounding structures were obtained without intravenous contrast. Angiographic images of the head were obtained using MRA technique without contrast. COMPARISON:  CT 09/25/2016 FINDINGS: MRI HEAD FINDINGS Brain: There is scattered punctate acute infarctions scattered throughout all vascular territories of the anterior and posterior circulation. These are most extensive in the left MCA territory were there are dozens of small discrete infarctions. No large confluent infarction. The findings are consistent with embolic infarctions from the heart or ascending aorta. The brain does not show a background pattern of chronic disease. No evidence of mass lesion, hemorrhage, hydrocephalus or extra-axial collection. Vascular: Major vessels at the base of the brain show flow. Skull and upper cervical spine: Negative Sinuses/Orbits: Clear/normal Other: None MRA HEAD FINDINGS Both internal carotid arteries are widely patent into the brain. The anterior and middle cerebral vessels are patent without proximal stenosis, aneurysm or vascular malformation. There is probably an occluded M2 branch of the left middle cerebral artery. No posterior circulation large or medium vessel abnormality is seen. Incidental vertebral fenestration. 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 Most extensive in the region of involvement is within the left MCA territory where there appears to be a missing left M2 branch. No large confluent infarction however. No hemorrhage, swelling or mass effect. No  pre-existing brain disease identified. Electronically Signed   By: Nelson Chimes M.D.   On: 09/26/2016 10:41   Ct Abdomen Pelvis W Contrast  Result Date: 09/28/2016 CLINICAL DATA:  Abdominal lymphadenopathy. EXAM: CT ABDOMEN AND PELVIS WITH CONTRAST TECHNIQUE: Multidetector CT imaging of the abdomen and pelvis was performed using the standard protocol following bolus administration of intravenous contrast. CONTRAST:  122mL ISOVUE-300 IOPAMIDOL (ISOVUE-300) INJECTION 61% COMPARISON:  Chest CTA on 09/27/2016 FINDINGS: Lower Chest: Tiny left pleural effusion and left basilar atelectasis. Mild lymphadenopathy in the inferior mediastinum along the posterior aspect of the distal esophagus. Hepatobiliary:  No masses identified. Gallbladder is unremarkable. Pancreas:  No mass or inflammatory changes. Spleen: Within normal limits in size and appearance. Adrenals/Urinary Tract: No masses identified. No evidence of hydronephrosis. Stomach/Bowel: No evidence of obstruction, inflammatory process or abnormal fluid collections. Vascular/Lymphatic: Lymphadenopathy is seen in the gastrohepatic and gastrosplenic ligaments and porta hepatis, with index lymph node  in the porta hepatis measure 2.7 cm on image 29/2. Mild retroperitoneal lymphadenopathy is also seen in the aorta caval and left paraaortic spaces, with index lymph node in the portacaval space are measuring 1.7 cm on image 41/2. No pathologically enlarged lymph nodes identified within the pelvis. Aortic atherosclerosis.  No abdominal aortic aneurysm. Reproductive: Mildly enlarged prostate, with focal contrast enhancement in right anterior mid gland, which may be due to prostatitis or prostate carcinoma. Normal appearance of seminal vesicles. Other:  None. Musculoskeletal:  No suspicious bone lesions identified. IMPRESSION: Abdominal lymphadenopathy in retroperitoneum, porta hepatis, gastrohepatic and gastrosplenic ligaments, and inferior mediastinum. Differential diagnosis  includes metastatic disease and lymphoma. Mildly enlarged prostate, with focal contrast enhancement in the right anterior mid gland which may be due to prostatitis or prostate carcinoma. No pelvic lymphadenopathy identified. Electronically Signed   By: Earle Gell M.D.   On: 09/28/2016 15:53   US Carotid Bilateral (at [facility] And [facility] Only)  Result Date: 09/26/2016 CLINICAL DATA:  CVA. EXAM: BILATERAL CAROTID DUPLEX ULTRASOUND TECHNIQUE: Pearline Cables scale imaging, color Doppler and duplex ultrasound were performed of bilateral carotid and vertebral arteries in the neck. COMPARISON:  None. FINDINGS: Criteria: Quantification of carotid stenosis is based on velocity parameters that correlate the residual internal carotid diameter with NASCET-based stenosis levels, using the diameter of the distal internal carotid lumen as the denominator for stenosis measurement. The following velocity measurements were obtained: RIGHT ICA:  76 cm/sec CCA:  78 cm/sec SYSTOLIC ICA/CCA RATIO:  1.0 DIASTOLIC ICA/CCA RATIO:  1.6 ECA:  88 cm/sec LEFT ICA:  63 cm/sec CCA:  88 cm/sec SYSTOLIC ICA/CCA RATIO:  0.7 DIASTOLIC ICA/CCA RATIO:  1.6 ECA:  96 cm/sec RIGHT CAROTID ARTERY: Small amount of plaque at the right carotid bulb without significant stenosis. External carotid artery is patent with normal waveform. Minimal plaque at the origin of the internal carotid artery. Normal waveforms and velocities in the internal carotid artery. RIGHT VERTEBRAL ARTERY: Antegrade flow and normal waveform in the right vertebral artery. LEFT CAROTID ARTERY: Minimal plaque at the left carotid bulb. External carotid artery is patent with normal waveform. Normal waveforms and velocities in the internal carotid artery. LEFT VERTEBRAL ARTERY: Antegrade flow and normal waveform in the left vertebral artery. IMPRESSION: Minimal atherosclerotic disease in the carotid arteries. No significant carotid artery stenosis. Estimated degree of stenosis in the internal carotid  arteries is less than 50% bilaterally. Patent vertebral arteries with antegrade flow. Electronically Signed   By: Markus Daft M.D.   On: 09/26/2016 09:51   US Aorta  Result Date: 09/21/2016 CLINICAL DATA:  Family history of abdominal aortic aneurysm EXAM: ULTRASOUND OF ABDOMINAL AORTA TECHNIQUE: Ultrasound examination of the abdominal aorta was performed to evaluate for abdominal aortic aneurysm. COMPARISON:  None. FINDINGS: Abdominal Aorta No aneurysm identified. Maximum Diameter: Proximal aorta measures 2.5 x 2.8 cm in diameter. Mid aorta measures 2.1 x 2 cm in diameter. Distal aorta measures 1.9 x 2 cm in diameter. Right common iliac artery measures 0.9 x 1 cm in diameter. Left common iliac artery measures 0.9 x 1 cm in diameter. IMPRESSION: No evidence of abdominal aortic aneurysm. Abdominal aorta measures 2.5 x 2.8 cm maximum diameter proximally. Electronically Signed   By: Lahoma Crocker M.D.   On: 09/21/2016 11:25   Mr Jodene Nam Head/brain [id number] Cm  Result Date: 09/26/2016 CLINICAL DATA:  Confusion and altered mental status over the last 2 days. EXAM: MRI HEAD WITHOUT CONTRAST MRA HEAD WITHOUT CONTRAST TECHNIQUE: Multiplanar, multiecho pulse sequences of the  brain and surrounding structures were obtained without intravenous contrast. Angiographic images of the head were obtained using MRA technique without contrast. COMPARISON:  CT 09/25/2016 FINDINGS: MRI HEAD FINDINGS Brain: There is scattered punctate acute infarctions scattered throughout all vascular territories of the anterior and posterior circulation. These are most extensive in the left MCA territory were there are dozens of small discrete infarctions. No large confluent infarction. The findings are consistent with embolic infarctions from the heart or ascending aorta. The brain does not show a background pattern of chronic disease. No evidence of mass lesion, hemorrhage, hydrocephalus or extra-axial collection. Vascular: Major vessels at the base of the  brain show flow. Skull and upper cervical spine: Negative Sinuses/Orbits: Clear/normal Other: None MRA HEAD FINDINGS Both internal carotid arteries are widely patent into the brain. The anterior and middle cerebral vessels are patent without proximal stenosis, aneurysm or vascular malformation. There is probably an occluded M2 branch of the left middle cerebral artery. No posterior circulation large or medium vessel abnormality is seen. Incidental vertebral fenestration. 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 Most extensive in the region of involvement is within the left MCA territory where there appears to be a missing left M2 branch. No large confluent infarction however. No hemorrhage, swelling or mass effect. No pre-existing brain disease identified. Electronically Signed   By: Nelson Chimes M.D.   On: 09/26/2016 10:41   Ct Angio Chest Aorta W/cm &/or Wo/cm  Result Date: 09/27/2016 CLINICAL DATA:  New CVA. Questionable aortic dilation on transthoracic echo. EXAM: CT ANGIOGRAPHY CHEST WITH CONTRAST TECHNIQUE: Multidetector CT imaging of the chest was performed using the standard protocol during bolus administration of intravenous contrast. Multiplanar CT image reconstructions and MIPs were obtained to evaluate the vascular anatomy. CONTRAST:  100 cc Isovue 370 intravenously. COMPARISON:  None. FINDINGS: Cardiovascular: Satisfactory opacification of the pulmonary arteries to the segmental level. Filling defects within the pulmonary arterial branch to the left lower lobe and segmental pulmonary arterial branch in the left lung base likely represents residual pulmonary emboli. The remaining of the pulmonary arteries are normally opacified. Normal heart size. No pericardial effusion. The ascending thoracic aorta is mildly dilated measuring 4.4 cm in diameter. Mediastinum/Nodes: No  enlarged mediastinal, hilar, or axillary lymph nodes. Thyroid gland, trachea, and esophagus demonstrate no significant findings. Small collection of lymph nodes versus paraesophageal varices is seen in the lower thorax. Lungs/Pleura: Persistent left lower lobe airspace consolidation versus atelectasis or hypoperfusion changes. Upper Abdomen: Soft tissue masses seen within the gastrohepatic ligament, porta hepaticus and in the left upper quadrant along the splenic artery. Musculoskeletal: No chest wall abnormality. No acute or significant osseous findings. Review of the MIP images confirms the above findings. 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 Recommend followup by ultrasound in 3 years. This recommendation follows ACR consensus guidelines: White Paper of the ACR Incidental Findings Committee II on Vascular Findings. 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 Left lower lobe airspace consolidation, atelectasis or hypoperfusion changes. Nodular left pleural thickening. This may represent loculated pleural effusion, however pleural metastatic disease cannot be excluded. Soft tissue masses in gastrohepatic ligament, porta hepaticus and retroperitoneum of the upper abdomen, which may represent lymphadenopathy. Further evaluation with abdomen and pelvis CT with contrast is recommended. Periesophageal  lymph nodes versus varices. These results were called by telephone at the time of interpretation on 09/27/2016 at 11:30 am to Dr. Nita Sells , who verbally acknowledged these results. Electronically Signed   By: Fidela Salisbury M.D.   On: 09/27/2016 11:40   Ct Head Code Stroke Wo Contrast`  Result Date: 09/25/2016 CLINICAL DATA:  Code stroke.  Confusion and dysphagia since 10 a.m. EXAM: CT HEAD WITHOUT CONTRAST  TECHNIQUE: Contiguous axial images were obtained from the base of the skull through the vertex without intravenous contrast. COMPARISON:  None. FINDINGS: Brain: No evidence of acute infarction, hemorrhage, hydrocephalus, extra-axial collection or mass lesion/mass effect. Vascular: Atherosclerotic calcification. Skull: No acute or aggressive finding. Mild enlargement of biparietal foramina. Sinuses/Orbits: Negative Other: These results were called by telephone at the time of interpretation on 09/25/2016 at 2:05 pm to Dr. Reather Converse, who verbally acknowledged these results. ASPECTS [facility] Stroke Program Early CT Score) - Ganglionic level infarction (caudate, lentiform nuclei, internal capsule, insula, M1-M3 cortex): 7 - Supraganglionic infarction (M4-M6 cortex): 3 Total score (0-10 with 10 being normal): 10 IMPRESSION: No acute finding. ASPECTS is 10. Electronically Signed   By: Monte Fantasia M.D.   On: 09/25/2016 14:06      Subjective: Discussed CT findings with patient and wife.  Patient asked appropriate questions.  Says he feels well.  Slept well.  Interested in outpatient therapies.   Discharge Exam: Vitals:   09/29/16 0125 09/29/16 0525  BP: 131/65 133/74  Pulse: 75 65  Resp: 18 20  Temp: 97.9 F (36.6 C) 97.9 F (36.6 C)   Vitals:   09/28/16 1930 09/28/16 2125 09/29/16 0125 09/29/16 0525  BP: 133/68 133/62 131/65 133/74  Pulse:  84 75 65  Resp:  20 18 20   Temp:  98 F (36.7 C) 97.9 F (36.6 C) 97.9 F (36.6 C)  TempSrc:  Oral Oral Oral  SpO2:  97% 98% 97%  Weight:      Height:        General: Pt is alert, awake, not in acute distress, still some word finding difficulties Cardiovascular: RRR, S1/S2 +, no rubs, no gallops Respiratory: CTA bilaterally, no wheezing, no rhonchi Abdominal: Soft, NT, ND, bowel sounds + Extremities: no edema, no cyanosis    The results of significant diagnostics from this hospitalization (including imaging, microbiology, ancillary and laboratory)  are listed below for reference.     Microbiology: No results found for this or any previous visit (from the past 240 hour(s)).   Labs: BNP (last 3 results) No results for input(s): BNP in the last 8760 hours. Basic Metabolic Panel:  Recent Labs Lab 09/25/16 1342 09/27/16 0440  NA 134* 138  K 3.8 3.6  CL 99* 105  CO2 29 27  GLUCOSE 110* 109*  BUN 19 17  CREATININE 1.00 0.86  CALCIUM 10.2 9.2   Liver Function Tests:  Recent Labs Lab 09/25/16 1342  AST 26  ALT 25  ALKPHOS 106  BILITOT 0.7  PROT 7.2  ALBUMIN 4.0   No results for input(s): LIPASE, AMYLASE in the last 168 hours. No results for input(s): AMMONIA in the last 168 hours. CBC:  Recent Labs Lab 09/25/16 1342 09/27/16 0440  WBC 6.9 6.5  NEUTROABS 4.7  --   HGB 12.9* 11.7*  HCT 38.3* 34.4*  MCV 89.7 89.4  PLT 206 171   Cardiac Enzymes:  Recent Labs Lab 09/25/16 2141 09/26/16 0417 09/27/16 0440  TROPONINI 1.23* 1.29* 0.57*   BNP: Invalid input(s): POCBNP CBG:  Recent Labs Lab 09/25/16 1341  GLUCAP 101*   D-Dimer No results for input(s): DDIMER in the last 72 hours. Hgb A1c No results for input(s): HGBA1C in the last 72 hours. Lipid Profile No results for input(s): CHOL, HDL, LDLCALC, TRIG, CHOLHDL, LDLDIRECT in the last 72 hours. Thyroid function studies No results for input(s): TSH, T4TOTAL, T3FREE, THYROIDAB in the last 72 hours.  Invalid input(s): FREET3 Anemia work up  Recent Labs  09/27/16 1351  VITAMINB12 1,344*   Urinalysis    Component Value Date/Time   COLORURINE YELLOW 09/27/2016 0035   APPEARANCEUR CLEAR 09/27/2016 0035   LABSPEC 1.019 09/27/2016 0035   PHURINE 5.0 09/27/2016 0035   GLUCOSEU NEGATIVE 09/27/2016 0035   HGBUR MODERATE (A) 09/27/2016 0035   BILIRUBINUR NEGATIVE 09/27/2016 0035   KETONESUR NEGATIVE 09/27/2016 0035   PROTEINUR NEGATIVE 09/27/2016 0035   NITRITE NEGATIVE 09/27/2016 0035   LEUKOCYTESUR NEGATIVE 09/27/2016 0035   Sepsis  Labs Invalid input(s): PROCALCITONIN,  WBC,  LACTICIDVEN Microbiology No results found for this or any previous visit (from the past 240 hour(s)).   Time coordinating discharge: 35 minutes  SIGNED:   Loretha Stapler, MD  [facility] 09/29/2016, 2:46 PM Pager [phone] If 7PM-7AM, please contact night-coverage [email or web address] Password [email or web address]

## 2016-09-29 NOTE — Progress Notes (Signed)
Pt wanting something to have BM. Dr. Adair Patter aware.

## 2016-09-29 NOTE — Plan of Care (Signed)
Problem: Self-Care: Goal: Ability to participate in self-care as condition permits will improve Outcome: Progressing Pt up to bathroom independently

## 2016-09-30 ENCOUNTER — Telehealth (HOSPITAL_COMMUNITY): Payer: Self-pay

## 2016-09-30 NOTE — Telephone Encounter (Signed)
l/m confriming to wife that all three had been scheduled together. NF 09/30/16

## 2016-10-03 ENCOUNTER — Telehealth: Payer: Self-pay | Admitting: *Deleted

## 2016-10-03 ENCOUNTER — Encounter (HOSPITAL_COMMUNITY): Payer: Medicare Other

## 2016-10-03 ENCOUNTER — Encounter (HOSPITAL_COMMUNITY): Payer: Medicare Other | Attending: Oncology | Admitting: Oncology

## 2016-10-03 ENCOUNTER — Encounter (HOSPITAL_COMMUNITY): Payer: Self-pay

## 2016-10-03 VITALS — BP 131/69 | HR 76 | Temp 97.7°F | Resp 18 | Wt 245.0 lb

## 2016-10-03 DIAGNOSIS — R590 Localized enlarged lymph nodes: Secondary | ICD-10-CM

## 2016-10-03 DIAGNOSIS — Z8673 Personal history of transient ischemic attack (TIA), and cerebral infarction without residual deficits: Secondary | ICD-10-CM | POA: Insufficient documentation

## 2016-10-03 DIAGNOSIS — Z7901 Long term (current) use of anticoagulants: Secondary | ICD-10-CM | POA: Insufficient documentation

## 2016-10-03 DIAGNOSIS — I2699 Other pulmonary embolism without acute cor pulmonale: Secondary | ICD-10-CM | POA: Insufficient documentation

## 2016-10-03 DIAGNOSIS — E785 Hyperlipidemia, unspecified: Secondary | ICD-10-CM | POA: Insufficient documentation

## 2016-10-03 DIAGNOSIS — I10 Essential (primary) hypertension: Secondary | ICD-10-CM | POA: Diagnosis not present

## 2016-10-03 DIAGNOSIS — F329 Major depressive disorder, single episode, unspecified: Secondary | ICD-10-CM | POA: Diagnosis not present

## 2016-10-03 DIAGNOSIS — Z79899 Other long term (current) drug therapy: Secondary | ICD-10-CM | POA: Diagnosis not present

## 2016-10-03 DIAGNOSIS — K219 Gastro-esophageal reflux disease without esophagitis: Secondary | ICD-10-CM | POA: Insufficient documentation

## 2016-10-03 LAB — CBC WITH DIFFERENTIAL/PLATELET
BASOS PCT: 1 %
Basophils Absolute: 0 10*3/uL (ref 0.0–0.1)
EOS ABS: 0.2 10*3/uL (ref 0.0–0.7)
Eosinophils Relative: 4 %
HEMATOCRIT: 35.2 % — AB (ref 39.0–52.0)
HEMOGLOBIN: 12 g/dL — AB (ref 13.0–17.0)
LYMPHS ABS: 1.4 10*3/uL (ref 0.7–4.0)
Lymphocytes Relative: 23 %
MCH: 30.5 pg (ref 26.0–34.0)
MCHC: 34.1 g/dL (ref 30.0–36.0)
MCV: 89.6 fL (ref 78.0–100.0)
MONOS PCT: 8 %
Monocytes Absolute: 0.5 10*3/uL (ref 0.1–1.0)
NEUTROS PCT: 64 %
Neutro Abs: 4 10*3/uL (ref 1.7–7.7)
Platelets: 154 10*3/uL (ref 150–400)
RBC: 3.93 MIL/uL — AB (ref 4.22–5.81)
RDW: 13.7 % (ref 11.5–15.5)
WBC: 6.2 10*3/uL (ref 4.0–10.5)

## 2016-10-03 LAB — COMPREHENSIVE METABOLIC PANEL
ALBUMIN: 3.8 g/dL (ref 3.5–5.0)
ALK PHOS: 104 U/L (ref 38–126)
ALT: 21 U/L (ref 17–63)
AST: 22 U/L (ref 15–41)
Anion gap: 8 (ref 5–15)
BUN: 18 mg/dL (ref 6–20)
CALCIUM: 9.7 mg/dL (ref 8.9–10.3)
CO2: 28 mmol/L (ref 22–32)
CREATININE: 1.07 mg/dL (ref 0.61–1.24)
Chloride: 100 mmol/L — ABNORMAL LOW (ref 101–111)
GFR calc Af Amer: >60 mL/min (ref >60)
GFR calc non Af Amer: >60 mL/min (ref >60)
GLUCOSE: 134 mg/dL — AB (ref 65–99)
Potassium: 3.6 mmol/L (ref 3.5–5.1)
SODIUM: 136 mmol/L (ref 135–145)
Total Bilirubin: 0.8 mg/dL (ref 0.3–1.2)
Total Protein: 6.6 g/dL (ref 6.5–8.1)

## 2016-10-03 LAB — LACTATE DEHYDROGENASE: LDH: 278 U/L — ABNORMAL HIGH (ref 98–192)

## 2016-10-03 NOTE — Telephone Encounter (Signed)
Call Completed and Appointment Scheduled: 10/12/2016 with Dr Dettinger   DISCHARGE INFORMATION Date of Discharge:09/29/2016  Discharge Facility: [facility]  Principal Discharge Diagnosis: CVA  Patient and/or caregiver is knowledgeable of his/her condition(s) and treatment: Yes  MEDICATION RECONCILIATION Current medication list reviewed with patient:Yes  Outpatient Encounter Prescriptions as of 10/03/2016  Medication Sig  . amLODipine (NORVASC) 5 MG tablet Take 1 tablet (5 mg total) by mouth daily.  [name] Kitchen apixaban (ELIQUIS) 5 MG TABS tablet Take 1 tablet (5 mg total) by mouth 2 (two) times daily.  [name] Kitchen atorvastatin (LIPITOR) 80 MG tablet Take 1 tablet (80 mg total) by mouth daily. (Patient taking differently: Take 80 mg by mouth at bedtime. )  . escitalopram (LEXAPRO) 20 MG tablet Take 1 tablet (20 mg total) by mouth daily.  [name] Kitchen omeprazole (PRILOSEC) 20 MG capsule Take 1 capsule (20 mg total) by mouth daily.   No facility-administered encounter medications on file as of 10/03/2016.     Discharge Medications reviewed and reconciled with current medications.yes  Patient is able to obtain needed medications:Yes  ACTIVITIES OF DAILY LIVING  Is the patient able to perform his/her own ADLs: Yes.    Patient is receiving home health services: No.  PATIENT EDUCATION Questions/Concerns Discussed: inablitiy to get in touch with PCP

## 2016-10-03 NOTE — Progress Notes (Signed)
[facility] NOTE  Patient Care Team: Worthy Rancher, MD as PCP - General (Family Medicine)  CHIEF COMPLAINTS/PURPOSE OF CONSULTATION:  Abdominal Lymphoma    No history exists.  PE/DVT  HISTORY OF PRESENTING ILLNESS:  [name] 66 y.o. 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   He was sent to the emergency department on 09/11/2015 and found to have scattered pulmonary emboli and bilateral DVTs. He was having shortness of breath and right lower extremity swelling and left lower chest pain. After the hospitalization he was started on Xarelto 15 mg twice a day and given a starter pack to transition him over the next few weeks on to Xarelto 20 mg daily.   He was admitted to the hospital again from 09/25/2016 - 09/29/2016 for a stroke. His Xarelto was stopped and he has started Eliquis. A CT Abdomen Pelvis done on 09/28/2016 showed Abdominal lymphadenopathy in retroperitoneum, porta hepatis, gastrohepatic and gastrosplenic ligaments, and inferior mediastinum. He has not had a biopsy since they are too deep to do a core needle biopsy.   He has been doing okay. He has been having a hard time with speech, so his wife does most of the talking. He can't recall words. There is some mild right upper extremity weakness, but he is able to do daily activities. Denies abdominal pain, dysuria, loss of appetite, night sweats, or any other concerns. He denies any B symptoms. ECOG 1.  MEDICAL HISTORY:  Past Medical History:  Diagnosis Date  . Depression   . DVT (deep venous thrombosis) ([place])   . GERD (gastroesophageal reflux disease)   . 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 Hyperlipidemia   . Hypertension   . PE (pulmonary thromboembolism) ([place])     SURGICAL HISTORY: Past Surgical History:  Procedure Laterality Date  . adnoidectomy    . APPENDECTOMY    . PROSTATE SURGERY    . TONSILLECTOMY AND ADENOIDECTOMY      SOCIAL HISTORY: Social  History   Social History  . Marital status: Married    Spouse name: N/A  . Number of children: N/A  . Years of education: N/A   Occupational History  . Not on file.   Social History Main Topics  . Smoking status: Never Smoker  . Smokeless tobacco: Never Used  . Alcohol use 0.0 oz/week     Comment: occasional  . Drug use: No  . Sexual activity: Yes    Birth control/ protection: Post-menopausal     Comment: Married for 16 years   Other Topics Concern  . Not on file   Social History Narrative   Epworth Sleepiness Scale = 12 (as of 09/09/2015)    FAMILY HISTORY: Family History  Problem Relation Age of Onset  . COPD Mother   . Heart disease Mother   . AAA (abdominal aortic aneurysm) Mother   . Heart disease Father   . Depression Father   . Diabetes Father   . Heart disease Brother   . Early death Brother     heart attack  . Heart disease Maternal Grandmother   . Heart disease Maternal Grandfather   . Stroke Paternal Grandmother   . Heart disease Brother   . Diabetes Brother     ALLERGIES:  has No Known Allergies.  MEDICATIONS:  Current Outpatient Prescriptions  Medication Sig Dispense Refill  . amLODipine (NORVASC) 5 MG tablet Take 1 tablet (5 mg total) by mouth daily.  90 tablet 3  . apixaban (ELIQUIS) 5 MG TABS tablet Take 1 tablet (5 mg total) by mouth 2 (two) times daily. 60 tablet 1  . atorvastatin (LIPITOR) 80 MG tablet Take 1 tablet (80 mg total) by mouth daily. (Patient taking differently: Take 80 mg by mouth at bedtime. ) 90 tablet 3  . Coenzyme Q10-Vitamin E (QUNOL ULTRA COQ10 PO) Take by mouth daily.    . Cyanocobalamin (VITAMIN B-12 PO) Take 1 tablet by mouth daily.    [name] Kitchen escitalopram (LEXAPRO) 20 MG tablet Take 1 tablet (20 mg total) by mouth daily. 90 tablet 2  . Multiple Vitamin (MULTIVITAMIN WITH MINERALS) TABS tablet Take 1 tablet by mouth at bedtime.    [name] Kitchen omeprazole (PRILOSEC) 20 MG capsule Take 1 capsule (20 mg total) by mouth daily. 90 capsule 3    . Polyvinyl Alcohol-Povidone (REFRESH OP) Place 1 drop into both eyes 2 (two) times daily as needed (dry eyes).    . Pseudoephedrine HCl (SUDAFED 12 HOUR PO) Take 1 tablet by mouth at bedtime as needed (sinus congestion).    . TURMERIC PO Take 3 capsules by mouth daily.     No current facility-administered medications for this visit.     Review of Systems  Constitutional: Negative.        Good appetite.  No night sweats  HENT: Negative.   Eyes: Negative.   Respiratory: Negative.   Cardiovascular: Negative.   Gastrointestinal: Negative.  Negative for abdominal pain.  Genitourinary: Negative.  Negative for dysuria.  Musculoskeletal: Negative.   Skin: Negative.   Neurological: Positive for speech change. Negative for focal weakness and weakness.       Stroke 1 week ago  Endo/Heme/Allergies: Negative.   Psychiatric/Behavioral: The patient is nervous/anxious.   All other systems reviewed and are negative. 14 point ROS was done and is otherwise as detailed above or in HPI  PHYSICAL EXAMINATION: ECOG PERFORMANCE STATUS: 1 - Symptomatic but completely ambulatory  Vitals:   10/03/16 1426  BP: 131/69  Pulse: 76  Resp: 18  Temp: 97.7 F (36.5 C)   Filed Weights   10/03/16 1426  Weight: 245 lb (111.1 kg)   Physical Exam  Constitutional: He is oriented to person, place, and time and well-developed, well-nourished, and in no distress.  HENT:  Head: Normocephalic and atraumatic.  Mouth/Throat: Oropharynx is clear and moist.  Eyes: Conjunctivae and EOM are normal. Pupils are equal, round, and reactive to light.  Neck: Normal range of motion. Neck supple.  Cardiovascular: Normal rate, regular rhythm and normal heart sounds.   Pulmonary/Chest: Effort normal and breath sounds normal. No respiratory distress. He has no wheezes.  Abdominal: Soft. Bowel sounds are normal. He exhibits no distension. There is no tenderness. There is no rebound.  Musculoskeletal: Normal range of motion.  He exhibits no edema or tenderness.  Lymphadenopathy:    He has no cervical adenopathy.    He has no axillary adenopathy.  Neurological: He is alert and oriented to person, place, and time. Gait normal.  Speech difficulties  Skin: Skin is warm and dry. No rash noted. No erythema.  Psychiatric: Affect and judgment normal.  Nursing note and vitals reviewed.  LABORATORY DATA:  I have reviewed the data as listed Lab Results  Component Value Date   WBC 6.5 09/27/2016   HGB 11.7 (L) 09/27/2016   HCT 34.4 (L) 09/27/2016   MCV 89.4 09/27/2016   PLT 171 09/27/2016   CMP     Component Value Date/Time  NA 138 09/27/2016 0440   NA 139 07/25/2016 0850   K 3.6 09/27/2016 0440   CL 105 09/27/2016 0440   CO2 27 09/27/2016 0440   GLUCOSE 109 (H) 09/27/2016 0440   BUN 17 09/27/2016 0440   BUN 11 07/25/2016 0850   CREATININE 0.86 09/27/2016 0440   CREATININE 0.84 09/09/2015 1440   CALCIUM 9.2 09/27/2016 0440   PROT 7.2 09/25/2016 1342   PROT 6.9 07/25/2016 0850   ALBUMIN 4.0 09/25/2016 1342   ALBUMIN 4.3 07/25/2016 0850   AST 26 09/25/2016 1342   ALT 25 09/25/2016 1342   ALKPHOS 106 09/25/2016 1342   BILITOT 0.7 09/25/2016 1342   BILITOT 0.6 07/25/2016 0850   GFRNONAA >60 09/27/2016 0440   GFRAA >60 09/27/2016 0440    RADIOGRAPHIC STUDIES: I have personally reviewed the radiological images as listed and agreed with the findings in the report.  CT Abdomen Pelvis 09/28/2016 IMPRESSION:  Abdominal lymphadenopathy in retroperitoneum, porta hepatis, gastrohepatic and gastrosplenic ligaments, and inferior mediastinum. Differential diagnosis includes metastatic disease and lymphoma.  Mildly enlarged prostate, with focal contrast enhancement in the right anterior mid gland which may be due to prostatitis or prostate carcinoma. No pelvic lymphadenopathy identified.  CT Angio Chest Aorta 09/27/2016 IMPRESSION: Mild diffuse fusiform dilation of the ascending thoracic aorta measuring  4.4 cm in maximum diameter. Recommend followup by ultrasound in 3 years. This recommendation follows ACR consensus guidelines: White Paper of the ACR Incidental Findings Committee II on Vascular Findings. 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  Left lower lobe airspace consolidation, atelectasis or hypoperfusion changes. Nodular left pleural thickening. This may represent loculated pleural effusion, however pleural metastatic disease cannot be excluded.  Soft tissue masses in gastrohepatic ligament, porta hepaticus and retroperitoneum of the upper abdomen, which may represent lymphadenopathy. Further evaluation with abdomen and pelvis CT with contrast is recommended.  Periesophageal lymph nodes versus varices.  MRI Brain w/o Contrast 09/26/2016 IMPRESSION: Multiple embolic infarctions scattered throughout the cerebellum and both cerebral hemispheres consistent with embolic disease from the heart or ascending aorta. Most extensive in the region of involvement is within the left MCA territory where there appears to be a missing left M2 branch. No large confluent infarction however. No hemorrhage, swelling or mass effect.  No pre-existing brain disease identified.  US Aorta 09/21/2016 IMPRESSION: No evidence of abdominal aortic aneurysm. Abdominal aorta measures 2.5 x 2.8 cm maximum diameter proximally.  CT Angio Chest 09/10/2016 IMPRESSION: 1. Multi lobar pulmonary emboli involving the LEFT lower lobe pulmonary arteries, RIGHT lower lobe pulmonary arteries, lingular pulmonary artery and RIGHT upper lobe pulmonary artery. 2. Overall clot burden is moderate to severe. 3. No evidence of RIGHT ventricular strain with the RIGHT ventricle ratio to LEFT ventricular ratio less than 1. 4. LEFT basilar atelectasis versus less likely infarction. Small LEFT  effusion. Critical Value/emergent results were called by telephone at the time of interpretation on 09/10/2016 at 6:58 pm to Dr. Dorie Rank , who verbally acknowledged these results.  ASSESSMENT & PLAN:   1. Diffuse abdominal lymphadnopathy- r/o lymphoma vs. Metastatic malignancy. No biopsies performed yet. 2. 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 Currently on eliquis 3. 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   I will order a stat PET-CT scan for staging. CBC, CMP, LDH.  Patient's case is very  complicated. I have discussed patient's case with Dr. Arnoldo Morale, who would like to see the PET scan results before he proceeds with surgery. I will re-discuss patient's case with Dr. Arnoldo Morale once his PET scan is back. He will need to get clearance from his neurologist before surgery, since he will need to be cleared by anesthesia as well. He doesn't meet with the neurologist until March. We will try to move this appointment up sooner.   Once we have a surgical date set, he will need to be bridged with lovenox. Lovenox injection 1mg /kg every 12 hours will need to be given for the 7 days before surgery. He would like it to be sent to [facility], not mail order.   He will return for follow up in 1 week to review PET scan results.   ORDERS PLACED FOR THIS ENCOUNTER: No orders of the defined types were placed in this encounter.   MEDICATIONS PRESCRIBED THIS ENCOUNTER: No orders of the defined types were placed in this encounter.  All questions were answered. The patient knows to call the clinic with any problems, questions or concerns.  This document serves as a record of services personally performed by Twana First, MD. It was created on her behalf by Martinique Casey, a trained medical scribe. The creation of this record is based on the scribe's personal observations and the provider's statements to them. This document has been  checked and approved by the attending provider.  I have reviewed the above documentation for accuracy and completeness and I agree with the above.  This note was electronically signed.    Martinique M Casey  10/03/2016 2:40 PM

## 2016-10-03 NOTE — Patient Instructions (Addendum)
[place] at [facility] Discharge Instructions  RECOMMENDATIONS MADE BY THE CONSULTANT AND ANY TEST RESULTS WILL BE SENT TO YOUR REFERRING PHYSICIAN.  You were seen today by Dr. Barron Schmid PET scan Lab work today Follow up in 1 week  See Amy up front for appointments   Thank you for choosing [place] at [facility] to provide your oncology and hematology care.  To afford each patient quality time with our provider, please arrive at least 15 minutes before your scheduled appointment time.    If you have a lab appointment with the [facility] please come in thru the  Main Entrance and check in at the main information desk  You need to re-schedule your appointment should you arrive 10 or more minutes late.  We strive to give you quality time with our providers, and arriving late affects you and other patients whose appointments are after yours.  Also, if you no show three or more times for appointments you may be dismissed from the clinic at the providers discretion.     Again, thank you for choosing [facility].  Our hope is that these requests will decrease the amount of time that you wait before being seen by our physicians.       _____________________________________________________________  Should you have questions after your visit to [facility], please contact our office at [phone] between the hours of 8:30 a.m. and 4:30 p.m.  Voicemails left after 4:30 p.m. will not be returned until the following business day.  For prescription refill requests, have your pharmacy contact our office.       Resources For Cancer Patients and their Caregivers ? American Cancer Society: Can assist with transportation, wigs, general needs, runs Look Good Feel Better.        418 001 6455 ? Cancer Care: Provides financial assistance, online support groups, medication/co-pay assistance.  1-800-813-HOPE  727-551-4230) ? [place] Assists [place] cancer patients and their families through emotional , educational and financial support.  332-828-2694 ? [place] DSS Where to apply for food stamps, Medicaid and utility assistance. 986 485 8752 ? [employer]: Transportation to medical appointments. 719-700-8282 ? Social Security Administration: May apply for disability if have a Stage IV cancer. 517-135-1601 484-643-5903 ? [employer], Disability and Transit Services: Assists with nutrition, care and transit needs. [place] Support Programs: @10RELATIVEDAYS @ > Cancer Support Group  2nd Tuesday of the month 1pm-2pm, Journey Room  > Creative Journey  3rd Tuesday of the month 1130am-1pm, Journey Room  > Look Good Feel Better  1st Wednesday of the month 10am-12 noon, Journey Room (Call [place] to register [phone])

## 2016-10-07 ENCOUNTER — Emergency Department (HOSPITAL_COMMUNITY)
Admission: EM | Admit: 2016-10-07 | Discharge: 2016-10-07 | Disposition: A | Discharge status: Home / Self Care | Payer: Medicare Other | Attending: Emergency Medicine | Admitting: Emergency Medicine

## 2016-10-07 ENCOUNTER — Telehealth: Payer: Self-pay | Admitting: Family Medicine

## 2016-10-07 ENCOUNTER — Encounter (HOSPITAL_COMMUNITY): Payer: Self-pay | Admitting: Emergency Medicine

## 2016-10-07 ENCOUNTER — Emergency Department (HOSPITAL_COMMUNITY): Payer: Medicare Other

## 2016-10-07 DIAGNOSIS — M79605 Pain in left leg: Secondary | ICD-10-CM | POA: Diagnosis not present

## 2016-10-07 DIAGNOSIS — I10 Essential (primary) hypertension: Secondary | ICD-10-CM | POA: Insufficient documentation

## 2016-10-07 DIAGNOSIS — R1030 Lower abdominal pain, unspecified: Secondary | ICD-10-CM | POA: Diagnosis not present

## 2016-10-07 DIAGNOSIS — Z79899 Other long term (current) drug therapy: Secondary | ICD-10-CM | POA: Diagnosis not present

## 2016-10-07 HISTORY — DX: Cerebral infarction, unspecified: I63.9

## 2016-10-07 MED ORDER — TRAMADOL HCL 50 MG PO TABS: 50.0000 mg | ORAL_TABLET | Freq: Four times a day (QID) | ORAL | 0 refills | Status: DC | PRN

## 2016-10-07 NOTE — Discharge Instructions (Signed)
Take the Ultram for pain if Tylenol does not help. Keep her leg elevated. Follow-up Tuesday as planned with your oncologist

## 2016-10-07 NOTE — ED Triage Notes (Signed)
Pt reports left groin pain x2 weeks when he was admitted to hospital for stroke.  Pt has hx of blood clots.  PT alert and oriented at this time.

## 2016-10-07 NOTE — ED Provider Notes (Signed)
[facility] DEPT Provider Note   CSN: [id number] Arrival date & time: 10/07/16  0930  By signing my name below, I, Higinio Plan, attest that this documentation has been prepared under the direction and in the presence of Milton Ferguson, MD . Electronically Signed: Higinio Plan, Scribe. 10/07/2016. 11:02 AM.  History   Chief Complaint Chief Complaint  Patient presents with  . Leg Pain   The history is provided by the spouse. No language interpreter was used.  Leg Pain   This is a new problem. The current episode started more than 1 week ago. The problem occurs every several days. The problem has been gradually worsening. The pain is present in the left upper leg. The pain is at a severity of 6/10. The pain is moderate. There has been no history of extremity trauma.   HPI Comments: Bluford Kubes is a 66 y.o. male with PMHx of DVT, PE, HTN, and stroke, who presents to the Emergency Department complaining of gradually worsening, intermittent, left thigh pain that began ~2 weeks ago and occurs "every other day." Per wife, pt was seen in the ED on 09/10/16 for chest pain and leg swelling in which he was diagnosed with a DVT and PE and admitted to [facility] for 4 days. She states pt was found to have 25 blood clots at [facility] and was discharged with Xarelto. She notes pt then visited [facility] ED on 09/25/16 for a Code Stroke in which he was admitted to [facility] where he was diagnosed with abdominal lymphoma. Pt's wife reports he visited his oncologist, Dr. Talbert Cage, on 10/03/16 and is scheduled to return in 1 week to review his PET scan results. Per wife, pt was "doing better" initially after his stroke" but has been declining throughout the week and was unable to express his pain to her until today. She reports she called Dr. Talbert Cage this morning about pt's current pain who advised pt to visit the ED. She notes pt's mother also experienced multiple blood clots and died on an aneurysm at the age of 23.    Past Medical History:  Diagnosis Date  . Depression   . DVT (deep venous thrombosis) ([place])   . GERD (gastroesophageal reflux disease)   . 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 Hyperlipidemia   . Hypertension   . PE (pulmonary thromboembolism) ([place])   . Stroke [facility])     Patient Active Problem List   Diagnosis Date Noted  . Lymphadenopathy, abdominal 10/03/2016  . Stroke ([place]) 09/25/2016  . Pulmonary emboli ([place]) 09/10/2016  . Acute DVT (deep venous thrombosis) ([place]) 09/10/2016  . Hyponatremia 09/10/2016  . Normocytic anemia 09/10/2016  . Acute pulmonary embolism ([place]) 09/10/2016  . Essential hypertension, benign 06/10/2015  . Depression 06/10/2015  . Hyperlipidemia LDL goal <130 06/10/2015  . GERD (gastroesophageal reflux disease) 06/10/2015    Past Surgical History:  Procedure Laterality Date  . adnoidectomy    . APPENDECTOMY    . PROSTATE SURGERY    . TONSILLECTOMY AND ADENOIDECTOMY      Home Medications    Prior to Admission medications   Medication Sig Start Date End Date Taking? Authorizing Provider  amLODipine (NORVASC) 5 MG tablet Take 1 tablet (5 mg total) by mouth daily. 11/12/15   Fransisca Kaufmann Dettinger, MD  apixaban (ELIQUIS) 5 MG TABS tablet Take 1 tablet (5 mg total) by mouth 2 (two) times daily. 09/29/16   Eber Jones, MD  atorvastatin (LIPITOR) 80  MG tablet Take 1 tablet (80 mg total) by mouth daily. Patient taking differently: Take 80 mg by mouth at bedtime.  11/12/15   Fransisca Kaufmann Dettinger, MD  escitalopram (LEXAPRO) 20 MG tablet Take 1 tablet (20 mg total) by mouth daily. 07/25/16   Fransisca Kaufmann Dettinger, MD  omeprazole (PRILOSEC) 20 MG capsule Take 1 capsule (20 mg total) by mouth daily. 11/12/15   Fransisca Kaufmann Dettinger, MD    Family History Family History  Problem Relation Age of Onset  . COPD Mother   . Heart disease Mother   . AAA (abdominal aortic aneurysm) Mother   . Heart disease Father   . Depression Father   . Diabetes Father   . Heart  disease Brother   . Early death Brother     heart attack  . Heart disease Maternal Grandmother   . Heart disease Maternal Grandfather   . Stroke Paternal Grandmother   . Heart disease Brother   . Diabetes Brother     Social History Social History  Substance Use Topics  . Smoking status: Never Smoker  . Smokeless tobacco: Never Used  . Alcohol use 0.0 oz/week     Comment: occasional   Allergies   Patient has no known allergies.  Review of Systems Review of Systems  Constitutional: Negative for appetite change and fatigue.  HENT: Negative for congestion, ear discharge and sinus pressure.   Eyes: Negative for discharge.  Respiratory: Negative for cough.   Cardiovascular: Negative for chest pain.  Gastrointestinal: Negative for abdominal pain and diarrhea.  Genitourinary: Negative for frequency and hematuria.  Musculoskeletal: Positive for arthralgias. Negative for back pain.  Skin: Negative for rash.  Neurological: Negative for seizures and headaches.  Psychiatric/Behavioral: Negative for hallucinations.   Physical Exam Updated Vital Signs BP 120/86 (BP Location: Left Arm)   Pulse 81   Temp 98 F (36.7 C) (Oral)   Resp 18   Ht 6\' 1"  (1.854 m)   Wt 235 lb (106.6 kg)   SpO2 94%   BMI 31.00 kg/m   Physical Exam  Constitutional: He is oriented to person, place, and time. He appears well-developed.  HENT:  Head: Normocephalic.  Eyes: Conjunctivae and EOM are normal. No scleral icterus.  Neck: Neck supple. No thyromegaly present.  Cardiovascular: Normal rate and regular rhythm.  Exam reveals no gallop and no friction rub.   No murmur heard. Pulmonary/Chest: No stridor. He has no wheezes. He has no rales. He exhibits no tenderness.  Abdominal: He exhibits no distension. There is no tenderness. There is no rebound.  Musculoskeletal: Normal range of motion. He exhibits no edema.  Tenderness in left thigh.   Lymphadenopathy:    He has no cervical adenopathy.    Neurological: He is oriented to person, place, and time. He exhibits normal muscle tone. Coordination normal.  Expressive aphasia and mild weakness in his right leg.   Skin: No rash noted. No erythema.  Psychiatric: He has a normal mood and affect. His behavior is normal.   ED Treatments / Results  DIAGNOSTIC STUDIES:  Oxygen Saturation is 94% on RA, normal by my interpretation.    COORDINATION OF CARE:  10:50 AM Discussed treatment plan with pt at bedside and pt agreed to plan.  Labs (all labs ordered are listed, but only abnormal results are displayed) Labs Reviewed  BASIC METABOLIC PANEL  CBC    EKG  EKG Interpretation None       Radiology No results found.  Procedures Procedures (including  critical care time)  Medications Ordered in ED Medications - No data to display  Initial Impression / Assessment and Plan / ED Course  I have reviewed the triage vital signs and the nursing notes.  Pertinent labs & imaging results that were available during my care of the patient were reviewed by me and considered in my medical decision making (see chart for details).    Patient with left thigh pain. Ultrasound did not show any blood clots. Patient is told to take Tylenol for pain and also given prescription of Ultram he will follow-up with his doctor Tuesday for recheck  Final Clinical Impressions(s) / ED Diagnoses   Final diagnoses:  None    New Prescriptions New Prescriptions   No medications on file  The chart was scribed for me under my direct supervision.  I personally performed the history, physical, and medical decision making and all procedures in the evaluation of this patient.Milton Ferguson, MD 10/07/16 1351

## 2016-10-10 ENCOUNTER — Encounter (HOSPITAL_COMMUNITY)
Admission: RE | Admit: 2016-10-10 | Discharge: 2016-10-10 | Disposition: A | Discharge status: Home / Self Care | Payer: Medicare Other | Source: Ambulatory Visit | Attending: Oncology | Admitting: Oncology

## 2016-10-10 ENCOUNTER — Ambulatory Visit (HOSPITAL_COMMUNITY): Payer: Medicare Other

## 2016-10-10 DIAGNOSIS — I2699 Other pulmonary embolism without acute cor pulmonale: Secondary | ICD-10-CM | POA: Insufficient documentation

## 2016-10-10 DIAGNOSIS — R599 Enlarged lymph nodes, unspecified: Secondary | ICD-10-CM | POA: Diagnosis not present

## 2016-10-10 DIAGNOSIS — R590 Localized enlarged lymph nodes: Secondary | ICD-10-CM

## 2016-10-10 LAB — GLUCOSE, CAPILLARY: GLUCOSE-CAPILLARY: 106 mg/dL — AB (ref 65–99)

## 2016-10-10 MED ORDER — FLUDEOXYGLUCOSE F - 18 (FDG) INJECTION
11.6400 | Freq: Once | INTRAVENOUS | Status: AC | PRN
  Administered 2016-10-10: 11.64 via INTRAVENOUS

## 2016-10-11 ENCOUNTER — Other Ambulatory Visit: Payer: Self-pay

## 2016-10-11 ENCOUNTER — Encounter (HOSPITAL_COMMUNITY): Payer: Self-pay | Admitting: Emergency Medicine

## 2016-10-11 ENCOUNTER — Inpatient Hospital Stay (HOSPITAL_COMMUNITY)
Admission: EM | Admit: 2016-10-11 | Discharge: 2016-10-17 | DRG: 246 | Disposition: A | Discharge status: Home / Self Care | Payer: Medicare Other | Attending: Interventional Cardiology | Admitting: Interventional Cardiology

## 2016-10-11 ENCOUNTER — Telehealth (HOSPITAL_COMMUNITY): Payer: Self-pay | Admitting: Oncology

## 2016-10-11 ENCOUNTER — Encounter (HOSPITAL_COMMUNITY)
Admission: EM | Disposition: A | Discharge status: Home / Self Care | Payer: Self-pay | Source: Home / Self Care | Attending: Interventional Cardiology

## 2016-10-11 ENCOUNTER — Emergency Department (HOSPITAL_COMMUNITY): Payer: Medicare Other

## 2016-10-11 ENCOUNTER — Ambulatory Visit (HOSPITAL_COMMUNITY): Payer: Medicare Other

## 2016-10-11 DIAGNOSIS — C779 Secondary and unspecified malignant neoplasm of lymph node, unspecified: Secondary | ICD-10-CM | POA: Diagnosis present

## 2016-10-11 DIAGNOSIS — I4510 Unspecified right bundle-branch block: Secondary | ICD-10-CM | POA: Diagnosis present

## 2016-10-11 DIAGNOSIS — I2510 Atherosclerotic heart disease of native coronary artery without angina pectoris: Secondary | ICD-10-CM | POA: Diagnosis not present

## 2016-10-11 DIAGNOSIS — F319 Bipolar disorder, unspecified: Secondary | ICD-10-CM | POA: Diagnosis present

## 2016-10-11 DIAGNOSIS — Z86711 Personal history of pulmonary embolism: Secondary | ICD-10-CM | POA: Diagnosis not present

## 2016-10-11 DIAGNOSIS — I5041 Acute combined systolic (congestive) and diastolic (congestive) heart failure: Secondary | ICD-10-CM | POA: Diagnosis not present

## 2016-10-11 DIAGNOSIS — I2699 Other pulmonary embolism without acute cor pulmonale: Secondary | ICD-10-CM | POA: Diagnosis not present

## 2016-10-11 DIAGNOSIS — I509 Heart failure, unspecified: Secondary | ICD-10-CM | POA: Diagnosis not present

## 2016-10-11 DIAGNOSIS — Z7901 Long term (current) use of anticoagulants: Secondary | ICD-10-CM | POA: Diagnosis not present

## 2016-10-11 DIAGNOSIS — E785 Hyperlipidemia, unspecified: Secondary | ICD-10-CM | POA: Diagnosis present

## 2016-10-11 DIAGNOSIS — Z955 Presence of coronary angioplasty implant and graft: Secondary | ICD-10-CM

## 2016-10-11 DIAGNOSIS — D649 Anemia, unspecified: Secondary | ICD-10-CM | POA: Diagnosis present

## 2016-10-11 DIAGNOSIS — G4733 Obstructive sleep apnea (adult) (pediatric): Secondary | ICD-10-CM | POA: Diagnosis present

## 2016-10-11 DIAGNOSIS — I10 Essential (primary) hypertension: Secondary | ICD-10-CM | POA: Diagnosis not present

## 2016-10-11 DIAGNOSIS — R591 Generalized enlarged lymph nodes: Secondary | ICD-10-CM | POA: Diagnosis not present

## 2016-10-11 DIAGNOSIS — E669 Obesity, unspecified: Secondary | ICD-10-CM | POA: Diagnosis present

## 2016-10-11 DIAGNOSIS — I110 Hypertensive heart disease with heart failure: Secondary | ICD-10-CM | POA: Diagnosis present

## 2016-10-11 DIAGNOSIS — I2102 ST elevation (STEMI) myocardial infarction involving left anterior descending coronary artery: Secondary | ICD-10-CM | POA: Diagnosis not present

## 2016-10-11 DIAGNOSIS — G4734 Idiopathic sleep related nonobstructive alveolar hypoventilation: Secondary | ICD-10-CM

## 2016-10-11 DIAGNOSIS — R0789 Other chest pain: Secondary | ICD-10-CM | POA: Diagnosis not present

## 2016-10-11 DIAGNOSIS — D6859 Other primary thrombophilia: Secondary | ICD-10-CM | POA: Diagnosis not present

## 2016-10-11 DIAGNOSIS — F329 Major depressive disorder, single episode, unspecified: Secondary | ICD-10-CM | POA: Diagnosis present

## 2016-10-11 DIAGNOSIS — I255 Ischemic cardiomyopathy: Secondary | ICD-10-CM | POA: Diagnosis present

## 2016-10-11 DIAGNOSIS — C8590 Non-Hodgkin lymphoma, unspecified, unspecified site: Secondary | ICD-10-CM | POA: Diagnosis present

## 2016-10-11 DIAGNOSIS — C762 Malignant neoplasm of abdomen: Secondary | ICD-10-CM

## 2016-10-11 DIAGNOSIS — Z9861 Coronary angioplasty status: Secondary | ICD-10-CM

## 2016-10-11 DIAGNOSIS — K319 Disease of stomach and duodenum, unspecified: Secondary | ICD-10-CM | POA: Diagnosis not present

## 2016-10-11 DIAGNOSIS — I2109 ST elevation (STEMI) myocardial infarction involving other coronary artery of anterior wall: Secondary | ICD-10-CM | POA: Diagnosis not present

## 2016-10-11 DIAGNOSIS — K219 Gastro-esophageal reflux disease without esophagitis: Secondary | ICD-10-CM | POA: Diagnosis present

## 2016-10-11 DIAGNOSIS — N400 Enlarged prostate without lower urinary tract symptoms: Secondary | ICD-10-CM | POA: Diagnosis present

## 2016-10-11 DIAGNOSIS — I213 ST elevation (STEMI) myocardial infarction of unspecified site: Secondary | ICD-10-CM | POA: Diagnosis not present

## 2016-10-11 DIAGNOSIS — D696 Thrombocytopenia, unspecified: Secondary | ICD-10-CM | POA: Diagnosis present

## 2016-10-11 DIAGNOSIS — Z8673 Personal history of transient ischemic attack (TIA), and cerebral infarction without residual deficits: Secondary | ICD-10-CM

## 2016-10-11 DIAGNOSIS — Z6831 Body mass index (BMI) 31.0-31.9, adult: Secondary | ICD-10-CM

## 2016-10-11 DIAGNOSIS — I5021 Acute systolic (congestive) heart failure: Secondary | ICD-10-CM | POA: Diagnosis present

## 2016-10-11 DIAGNOSIS — Z86718 Personal history of other venous thrombosis and embolism: Secondary | ICD-10-CM

## 2016-10-11 DIAGNOSIS — I2101 ST elevation (STEMI) myocardial infarction involving left main coronary artery: Secondary | ICD-10-CM | POA: Diagnosis not present

## 2016-10-11 DIAGNOSIS — F32A Depression, unspecified: Secondary | ICD-10-CM | POA: Diagnosis present

## 2016-10-11 DIAGNOSIS — R079 Chest pain, unspecified: Secondary | ICD-10-CM | POA: Diagnosis not present

## 2016-10-11 DIAGNOSIS — I824Y3 Acute embolism and thrombosis of unspecified deep veins of proximal lower extremity, bilateral: Secondary | ICD-10-CM | POA: Diagnosis not present

## 2016-10-11 DIAGNOSIS — I63133 Cerebral infarction due to embolism of bilateral carotid arteries: Secondary | ICD-10-CM

## 2016-10-11 DIAGNOSIS — Z79899 Other long term (current) drug therapy: Secondary | ICD-10-CM

## 2016-10-11 HISTORY — DX: Chronic systolic (congestive) heart failure: I50.22

## 2016-10-11 HISTORY — PX: LEFT HEART CATH AND CORONARY ANGIOGRAPHY: CATH118249

## 2016-10-11 HISTORY — DX: Generalized enlarged lymph nodes: R59.1

## 2016-10-11 HISTORY — DX: Atherosclerotic heart disease of native coronary artery without angina pectoris: I25.10

## 2016-10-11 HISTORY — DX: Other symptoms and signs involving the nervous system: R29.818

## 2016-10-11 HISTORY — PX: CORONARY STENT INTERVENTION: CATH118234

## 2016-10-11 HISTORY — DX: Aortic ectasia, unspecified site: I77.819

## 2016-10-11 HISTORY — DX: Essential (primary) hypertension: I10

## 2016-10-11 HISTORY — DX: Ischemic cardiomyopathy: I25.5

## 2016-10-11 HISTORY — DX: Other primary thrombophilia: D68.59

## 2016-10-11 HISTORY — DX: Hypo-osmolality and hyponatremia: E87.1

## 2016-10-11 HISTORY — DX: Personal history of transient ischemic attack (TIA), and cerebral infarction without residual deficits: Z86.73

## 2016-10-11 HISTORY — DX: Anemia, unspecified: D64.9

## 2016-10-11 LAB — COMPREHENSIVE METABOLIC PANEL
ALT: 27 U/L (ref 17–63)
ANION GAP: 9 (ref 5–15)
AST: 29 U/L (ref 15–41)
Albumin: 3.9 g/dL (ref 3.5–5.0)
Alkaline Phosphatase: 103 U/L (ref 38–126)
BUN: 18 mg/dL (ref 6–20)
CHLORIDE: 106 mmol/L (ref 101–111)
CO2: 24 mmol/L (ref 22–32)
Calcium: 9.7 mg/dL (ref 8.9–10.3)
Creatinine, Ser: 1.08 mg/dL (ref 0.61–1.24)
Glucose, Bld: 142 mg/dL — ABNORMAL HIGH (ref 65–99)
POTASSIUM: 3.4 mmol/L — AB (ref 3.5–5.1)
SODIUM: 139 mmol/L (ref 135–145)
Total Bilirubin: 1.2 mg/dL (ref 0.3–1.2)
Total Protein: 6.9 g/dL (ref 6.5–8.1)

## 2016-10-11 LAB — CBC
HEMATOCRIT: 37.8 % — AB (ref 39.0–52.0)
Hemoglobin: 12.8 g/dL — ABNORMAL LOW (ref 13.0–17.0)
MCH: 30 pg (ref 26.0–34.0)
MCHC: 33.9 g/dL (ref 30.0–36.0)
MCV: 88.7 fL (ref 78.0–100.0)
PLATELETS: 89 10*3/uL — AB (ref 150–400)
RBC: 4.26 MIL/uL (ref 4.22–5.81)
RDW: 14.1 % (ref 11.5–15.5)
WBC: 8.6 10*3/uL (ref 4.0–10.5)

## 2016-10-11 LAB — DIFFERENTIAL
BASOS ABS: 0 10*3/uL (ref 0.0–0.1)
BASOS PCT: 0 %
EOS ABS: 0.3 10*3/uL (ref 0.0–0.7)
Eosinophils Relative: 3 %
Lymphocytes Relative: 25 %
Lymphs Abs: 2.2 10*3/uL (ref 0.7–4.0)
MONOS PCT: 6 %
Monocytes Absolute: 0.5 10*3/uL (ref 0.1–1.0)
NEUTROS ABS: 5.7 10*3/uL (ref 1.7–7.7)
NEUTROS PCT: 66 %

## 2016-10-11 LAB — TROPONIN I
TROPONIN I: 0.26 ng/mL — AB (ref <0.03)
Troponin I: >65 ng/mL (ref <0.03)
Troponin I: >65 ng/mL (ref <0.03)
Troponin I: >65 ng/mL (ref <0.03)

## 2016-10-11 LAB — MRSA PCR SCREENING: MRSA by PCR: NEGATIVE

## 2016-10-11 LAB — LIPID PANEL
CHOL/HDL RATIO: 3.5 ratio
CHOLESTEROL: 131 mg/dL (ref 0–200)
HDL: 37 mg/dL — ABNORMAL LOW (ref >40)
LDL Cholesterol: 68 mg/dL (ref 0–99)
TRIGLYCERIDES: 131 mg/dL (ref <150)
VLDL: 26 mg/dL (ref 0–40)

## 2016-10-11 LAB — PROTIME-INR
INR: 1.46
PROTHROMBIN TIME: 17.9 s — AB (ref 11.4–15.2)

## 2016-10-11 LAB — POCT ACTIVATED CLOTTING TIME
ACTIVATED CLOTTING TIME: 334 s
Activated Clotting Time: 263 seconds
Activated Clotting Time: 279 seconds

## 2016-10-11 LAB — APTT: APTT: 32 s (ref 24–36)

## 2016-10-11 SURGERY — LEFT HEART CATH AND CORONARY ANGIOGRAPHY
Anesthesia: LOCAL

## 2016-10-11 MED ORDER — LISINOPRIL 2.5 MG PO TABS
2.5000 mg | ORAL_TABLET | Freq: Every day | ORAL | Status: DC
  Administered 2016-10-12 – 2016-10-16 (×5): 2.5 mg via ORAL
  Filled 2016-10-11 (×5): qty 1

## 2016-10-11 MED ORDER — ACETAMINOPHEN 325 MG PO TABS
650.0000 mg | ORAL_TABLET | ORAL | Status: DC | PRN
  Administered 2016-10-14: 650 mg via ORAL
  Filled 2016-10-11: qty 2

## 2016-10-11 MED ORDER — NITROGLYCERIN 1 MG/10 ML FOR IR/CATH LAB
INTRA_ARTERIAL | Status: DC | PRN
  Administered 2016-10-11: 200 ug via INTRACORONARY

## 2016-10-11 MED ORDER — HEPARIN (PORCINE) IN NACL 2-0.9 UNIT/ML-% IJ SOLN
INTRAMUSCULAR | Status: AC
  Filled 2016-10-11: qty 1000

## 2016-10-11 MED ORDER — HEPARIN SODIUM (PORCINE) 1000 UNIT/ML IJ SOLN
INTRAMUSCULAR | Status: AC
  Filled 2016-10-11: qty 1

## 2016-10-11 MED ORDER — SODIUM CHLORIDE 0.9 % IV SOLN
INTRAVENOUS | Status: AC
  Administered 2016-10-11: 07:00:00 via INTRAVENOUS

## 2016-10-11 MED ORDER — CLOPIDOGREL BISULFATE 300 MG PO TABS
ORAL_TABLET | ORAL | Status: AC
  Filled 2016-10-11: qty 2

## 2016-10-11 MED ORDER — ASPIRIN 81 MG PO CHEW
81.0000 mg | CHEWABLE_TABLET | Freq: Every day | ORAL | Status: DC
  Administered 2016-10-12 – 2016-10-17 (×6): 81 mg via ORAL
  Filled 2016-10-11 (×6): qty 1

## 2016-10-11 MED ORDER — LABETALOL HCL 5 MG/ML IV SOLN: 10.0000 mg | INTRAVENOUS | Status: AC | PRN

## 2016-10-11 MED ORDER — ONDANSETRON HCL 4 MG/2ML IJ SOLN
INTRAMUSCULAR | Status: AC
  Filled 2016-10-11: qty 2

## 2016-10-11 MED ORDER — VERAPAMIL HCL 2.5 MG/ML IV SOLN
INTRAVENOUS | Status: DC | PRN
  Administered 2016-10-11: 10 mL via INTRA_ARTERIAL

## 2016-10-11 MED ORDER — LIDOCAINE HCL (PF) 1 % IJ SOLN
INTRAMUSCULAR | Status: AC
  Filled 2016-10-11: qty 30

## 2016-10-11 MED ORDER — NITROGLYCERIN IN D5W 200-5 MCG/ML-% IV SOLN: 10.0000 ug/min | INTRAVENOUS | Status: AC

## 2016-10-11 MED ORDER — SODIUM CHLORIDE 0.9 % IV SOLN
10.0000 mL/h | INTRAVENOUS | Status: DC
  Administered 2016-10-11: 10 mL/h via INTRAVENOUS

## 2016-10-11 MED ORDER — IOPAMIDOL (ISOVUE-370) INJECTION 76%
INTRAVENOUS | Status: DC | PRN
  Administered 2016-10-11: 245 mL via INTRA_ARTERIAL

## 2016-10-11 MED ORDER — FENTANYL CITRATE (PF) 100 MCG/2ML IJ SOLN
INTRAMUSCULAR | Status: AC
  Filled 2016-10-11: qty 2

## 2016-10-11 MED ORDER — TIROFIBAN (AGGRASTAT) BOLUS VIA INFUSION
INTRAVENOUS | Status: DC | PRN
  Administered 2016-10-11: 2665 ug via INTRAVENOUS

## 2016-10-11 MED ORDER — TIROFIBAN HCL IN NACL 5-0.9 MG/100ML-% IV SOLN
INTRAVENOUS | Status: AC
  Filled 2016-10-11: qty 100

## 2016-10-11 MED ORDER — NITROGLYCERIN 0.4 MG SL SUBL
SUBLINGUAL_TABLET | SUBLINGUAL | Status: AC
  Administered 2016-10-11: 0.4 mg
  Filled 2016-10-11: qty 1

## 2016-10-11 MED ORDER — SODIUM CHLORIDE 0.9 % IV SOLN: 250.0000 mL | INTRAVENOUS | Status: DC | PRN

## 2016-10-11 MED ORDER — NITROGLYCERIN 1 MG/10 ML FOR IR/CATH LAB
INTRA_ARTERIAL | Status: AC
  Filled 2016-10-11: qty 10

## 2016-10-11 MED ORDER — HEPARIN SODIUM (PORCINE) 1000 UNIT/ML IJ SOLN
INTRAMUSCULAR | Status: DC | PRN
  Administered 2016-10-11: 2500 [IU] via INTRAVENOUS
  Administered 2016-10-11: 4000 [IU] via INTRAVENOUS
  Administered 2016-10-11: 5000 [IU] via INTRAVENOUS
  Administered 2016-10-11: 2500 [IU] via INTRAVENOUS

## 2016-10-11 MED ORDER — NITROGLYCERIN IN D5W 200-5 MCG/ML-% IV SOLN
10.0000 ug/min | INTRAVENOUS | Status: AC
  Administered 2016-10-11: 10 ug/min via INTRAVENOUS
  Filled 2016-10-11: qty 250

## 2016-10-11 MED ORDER — FAMOTIDINE IN NACL 20-0.9 MG/50ML-% IV SOLN
INTRAVENOUS | Status: DC | PRN
  Administered 2016-10-11: 20 mg via INTRAVENOUS

## 2016-10-11 MED ORDER — FAMOTIDINE IN NACL 20-0.9 MG/50ML-% IV SOLN
INTRAVENOUS | Status: AC
  Filled 2016-10-11: qty 50

## 2016-10-11 MED ORDER — FENTANYL CITRATE (PF) 100 MCG/2ML IJ SOLN
INTRAMUSCULAR | Status: DC | PRN
  Administered 2016-10-11 (×3): 50 ug via INTRAVENOUS

## 2016-10-11 MED ORDER — IOPAMIDOL (ISOVUE-370) INJECTION 76%
INTRAVENOUS | Status: AC
  Filled 2016-10-11: qty 100

## 2016-10-11 MED ORDER — ASPIRIN 81 MG PO CHEW
324.0000 mg | CHEWABLE_TABLET | Freq: Once | ORAL | Status: AC
  Administered 2016-10-11: 324 mg via ORAL
  Filled 2016-10-11: qty 4

## 2016-10-11 MED ORDER — FUROSEMIDE 10 MG/ML IJ SOLN
20.0000 mg | Freq: Once | INTRAMUSCULAR | Status: AC
  Administered 2016-10-11: 20 mg via INTRAVENOUS
  Filled 2016-10-11: qty 2

## 2016-10-11 MED ORDER — APIXABAN 5 MG PO TABS
5.0000 mg | ORAL_TABLET | Freq: Two times a day (BID) | ORAL | Status: DC
  Administered 2016-10-11 – 2016-10-12 (×2): 5 mg via ORAL
  Filled 2016-10-11 (×2): qty 1

## 2016-10-11 MED ORDER — SODIUM CHLORIDE 0.9% FLUSH
3.0000 mL | INTRAVENOUS | Status: DC | PRN
  Administered 2016-10-12: 3 mL via INTRAVENOUS
  Filled 2016-10-11: qty 3

## 2016-10-11 MED ORDER — MORPHINE SULFATE (PF) 2 MG/ML IV SOLN: 2.0000 mg | INTRAVENOUS | Status: DC | PRN

## 2016-10-11 MED ORDER — POTASSIUM CHLORIDE CRYS ER 20 MEQ PO TBCR
40.0000 meq | EXTENDED_RELEASE_TABLET | Freq: Once | ORAL | Status: AC
  Administered 2016-10-11: 40 meq via ORAL
  Filled 2016-10-11: qty 2

## 2016-10-11 MED ORDER — LIDOCAINE HCL (PF) 1 % IJ SOLN
INTRAMUSCULAR | Status: DC | PRN
  Administered 2016-10-11: 2 mL

## 2016-10-11 MED ORDER — ESCITALOPRAM OXALATE 20 MG PO TABS
20.0000 mg | ORAL_TABLET | Freq: Every day | ORAL | Status: DC
  Administered 2016-10-11 – 2016-10-17 (×7): 20 mg via ORAL
  Filled 2016-10-11 (×5): qty 2
  Filled 2016-10-11: qty 1
  Filled 2016-10-11: qty 2

## 2016-10-11 MED ORDER — CLOPIDOGREL BISULFATE 300 MG PO TABS
ORAL_TABLET | ORAL | Status: DC | PRN
  Administered 2016-10-11: 600 mg via ORAL

## 2016-10-11 MED ORDER — CLOPIDOGREL BISULFATE 75 MG PO TABS
75.0000 mg | ORAL_TABLET | Freq: Every day | ORAL | Status: DC
  Administered 2016-10-12 – 2016-10-17 (×6): 75 mg via ORAL
  Filled 2016-10-11 (×6): qty 1

## 2016-10-11 MED ORDER — ATORVASTATIN CALCIUM 80 MG PO TABS
80.0000 mg | ORAL_TABLET | Freq: Every day | ORAL | Status: DC
  Administered 2016-10-11 – 2016-10-17 (×7): 80 mg via ORAL
  Filled 2016-10-11 (×7): qty 1

## 2016-10-11 MED ORDER — CARVEDILOL 3.125 MG PO TABS
3.1250 mg | ORAL_TABLET | Freq: Two times a day (BID) | ORAL | Status: DC
  Administered 2016-10-11 – 2016-10-17 (×13): 3.125 mg via ORAL
  Filled 2016-10-11 (×13): qty 1

## 2016-10-11 MED ORDER — SODIUM CHLORIDE 0.9% FLUSH
3.0000 mL | Freq: Two times a day (BID) | INTRAVENOUS | Status: DC
  Administered 2016-10-11 – 2016-10-13 (×5): 3 mL via INTRAVENOUS

## 2016-10-11 MED ORDER — IOPAMIDOL (ISOVUE-370) INJECTION 76%
INTRAVENOUS | Status: AC
  Filled 2016-10-11: qty 125

## 2016-10-11 MED ORDER — HYDRALAZINE HCL 20 MG/ML IJ SOLN: 5.0000 mg | INTRAMUSCULAR | Status: AC | PRN

## 2016-10-11 MED ORDER — HEPARIN (PORCINE) IN NACL 2-0.9 UNIT/ML-% IJ SOLN
INTRAMUSCULAR | Status: DC | PRN
  Administered 2016-10-11: 1000 mL

## 2016-10-11 MED ORDER — VERAPAMIL HCL 2.5 MG/ML IV SOLN
INTRAVENOUS | Status: AC
  Filled 2016-10-11: qty 2

## 2016-10-11 MED ORDER — ONDANSETRON HCL 4 MG/2ML IJ SOLN
INTRAMUSCULAR | Status: DC | PRN
  Administered 2016-10-11: 4 mg via INTRAVENOUS

## 2016-10-11 MED ORDER — FUROSEMIDE 10 MG/ML IJ SOLN
INTRAMUSCULAR | Status: AC
  Filled 2016-10-11: qty 2

## 2016-10-11 MED ORDER — OXYCODONE-ACETAMINOPHEN 5-325 MG PO TABS
1.0000 | ORAL_TABLET | ORAL | Status: DC | PRN
  Administered 2016-10-15: 1 via ORAL
  Filled 2016-10-11: qty 1

## 2016-10-11 MED ORDER — ONDANSETRON HCL 4 MG/2ML IJ SOLN
4.0000 mg | Freq: Four times a day (QID) | INTRAMUSCULAR | Status: DC | PRN
  Administered 2016-10-11 – 2016-10-12 (×2): 4 mg via INTRAVENOUS
  Filled 2016-10-11 (×2): qty 2

## 2016-10-11 MED ORDER — FUROSEMIDE 10 MG/ML IJ SOLN
20.0000 mg | Freq: Once | INTRAMUSCULAR | Status: AC
  Administered 2016-10-11: 20 mg via INTRAVENOUS

## 2016-10-11 SURGICAL SUPPLY — 25 items
BALLN EMERGE MR 3.0X12 (BALLOONS) ×2
BALLN EUPHORA RX 2.5X15 (BALLOONS) ×2
BALLN MOZEC 2.50X14 (BALLOONS) ×2
BALLN ~~LOC~~ MOZEC 3.5X13 (BALLOONS) ×2
BALLOON EMERGE MR 3.0X12 (BALLOONS) ×1 IMPLANT
BALLOON EUPHORA RX 2.5X15 (BALLOONS) ×1 IMPLANT
BALLOON MOZEC 2.50X14 (BALLOONS) ×1 IMPLANT
BALLOON ~~LOC~~ MOZEC 3.5X13 (BALLOONS) ×1 IMPLANT
CATH EXTRAC PRONTO 5.5F 138CM (CATHETERS) ×2 IMPLANT
CATH INFINITI 5 FR JL3.5 (CATHETERS) ×2 IMPLANT
CATH INFINITI JR4 5F (CATHETERS) ×2 IMPLANT
CATH LAUNCHER 5F RADR (CATHETERS) ×1 IMPLANT
CATH VISTA GUIDE 6FR XBLAD3.0 (CATHETERS) ×2 IMPLANT
CATHETER LAUNCHER 5F RADR (CATHETERS) ×2
DEVICE RAD COMP TR BAND LRG (VASCULAR PRODUCTS) ×2 IMPLANT
GLIDESHEATH SLEND A-KIT 6F 22G (SHEATH) ×2 IMPLANT
GUIDEWIRE INQWIRE 1.5J.035X260 (WIRE) ×1 IMPLANT
INQWIRE 1.5J .035X260CM (WIRE) ×2
KIT ENCORE 26 ADVANTAGE (KITS) ×2 IMPLANT
KIT HEART LEFT (KITS) ×2 IMPLANT
PACK CARDIAC CATHETERIZATION (CUSTOM PROCEDURE TRAY) ×2 IMPLANT
STENT SYNERGY DES 3X16 (Permanent Stent) ×2 IMPLANT
TRANSDUCER W/STOPCOCK (MISCELLANEOUS) ×2 IMPLANT
TUBING CIL FLEX 10 FLL-RA (TUBING) ×2 IMPLANT
WIRE ASAHI PROWATER 180CM (WIRE) ×2 IMPLANT

## 2016-10-11 NOTE — H&P (Signed)
Griff Badley is a 66 y.o. male  Admit Date: 10/11/2016 Referring Physician: [facility], emergency room Primary Cardiologist: Linard Millers (new) Chief complaint / reason for admission: Acute anterior ST elevation MI  HPI: Complicated 66 year old gentleman with acute pulmonary embolism 03/47/4259, multiple embolic cerebral emboli 56/38/7564, hypertension, hyperlipidemia, gastroesophageal reflux, history of depression/bipolar disorder, and abdominal lymphadenopathy being evaluated by Dr. Twana First, at the [place]. Lymphoma or metastatic malignancy is an ongoing consideration.  At 2 AM the patient awakened with severe chest pain. 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 EKG at the emergency room at [facility] demonstrated ST elevation in V4 56, II, III, and aVF. Incomplete right bundle branch block was noted. STEMI was activated. No prior history of such chest discomfort.  The patient was met in the emergency room ambulance bay and escorted to the cath lab. He was having ongoing significant chest discomfort without dyspnea, nausea, or diaphoresis. Some speech difficulty was noted presumed secondary to prior embolic CVA.  PMH:    Past Medical History:  Diagnosis Date  . Depression   . DVT (deep venous thrombosis) ([place])   . GERD (gastroesophageal reflux disease)   . 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 Hyperlipidemia   . Hypertension   . PE (pulmonary thromboembolism) ([place])   . Stroke [facility])     PSH:    Past Surgical History:  Procedure Laterality Date  . adnoidectomy    . APPENDECTOMY    . PROSTATE SURGERY    . TONSILLECTOMY AND ADENOIDECTOMY     ALLERGIES:   Patient has no known allergies. Prior to Admit Meds:   Prescriptions Prior to Admission  Medication Sig Dispense Refill Last Dose  . amLODipine (NORVASC) 5 MG tablet Take 1 tablet (5 mg total) by mouth daily. 90 tablet 3 10/07/2016 at  Unknown time  . apixaban (ELIQUIS) 5 MG TABS tablet Take 1 tablet (5 mg total) by mouth 2 (two) times daily. 60 tablet 1 10/07/2016 at 0700  . atorvastatin (LIPITOR) 80 MG tablet Take 1 tablet (80 mg total) by mouth daily. (Patient taking differently: Take 80 mg by mouth at bedtime. ) 90 tablet 3 10/06/2016 at Unknown time  . escitalopram (LEXAPRO) 20 MG tablet Take 1 tablet (20 mg total) by mouth daily. 90 tablet 2 10/07/2016 at Unknown time  . omeprazole (PRILOSEC) 20 MG capsule Take 1 capsule (20 mg total) by mouth daily. 90 capsule 3 10/07/2016 at Unknown time  . Cyanocobalamin (VITAMIN B-12 PO) Take 1 tablet by mouth once.   10/06/2016 at Unknown time  . traMADol (ULTRAM) 50 MG tablet Take 1 tablet (50 mg total) by mouth every 6 (six) hours as needed. 20 tablet 0   . TURMERIC PO Take 1 tablet by mouth daily as needed (joint pain).   10/07/2016 at Unknown time   Family HX:    Family History  Problem Relation Age of Onset  . COPD Mother   . Heart disease Mother   . AAA (abdominal aortic aneurysm) Mother   . Heart disease Father   . Depression Father   . Diabetes Father   . Heart disease Brother   . Early death Brother     heart attack  . Heart disease Maternal Grandmother   . Heart disease Maternal Grandfather   . Stroke Paternal Grandmother   . Heart disease Brother   . Diabetes Brother  Social HX:    Social History   Social History  . Marital status: Married    Spouse name: N/A  . Number of children: N/A  . Years of education: N/A   Occupational History  . Not on file.   Social History Main Topics  . Smoking status: Never Smoker  . Smokeless tobacco: Never Used  . Alcohol use 0.0 oz/week     Comment: occasional  . Drug use: No  . Sexual activity: Yes    Birth control/ protection: Post-menopausal     Comment: Married for 16 years   Other Topics Concern  . Not on file   Social History Narrative   Epworth Sleepiness Scale = 12 (as of 09/09/2015)     ROS Severe  pain in the chest. Lower back discomfort. Frustration with speech difficulty related to relatively recent CVA. All other systems are negative.  Physical Exam: Blood pressure 130/85, pulse 83, temperature 97.7 F (36.5 C), temperature source Oral, resp. rate 18, height _0  (1.854 m), weight 235 lb (106.6 kg), SpO2 (!) 88 %.    Obese, pale, in obvious discomfort during examination which occurred in transit to the cath lab from the ambulance bay. HEENT exam reveals no evidence of jaundice. No significant pallor is noted. Neck exam reveals bilaterally symmetric carotid upstroke. JVD is difficult to assess. Chest is clear to auscultation anterior upper and lower segments. Basilar crackles are heard in the posterior lung exam. Cardiac exam reveals distant heart sounds. No rub. No obvious murmur. Abdomen is obese but nontender. No bruits are heard. Extremities reveal no edema. Posterior tibial and dorsalis pedis pulses are felt bilaterally. Pulses are better in the right lower extremity than the left lower extremity. Neurological exam reveals slight reduction in strength on the left side. There is difficulty with speech. The patient is able to move both arms and both legs. No obvious facial droop is noted.  Labs: Lab Results  Component Value Date   WBC 8.6 10/11/2016   HGB 12.8 (L) 10/11/2016   HCT 37.8 (L) 10/11/2016   MCV 88.7 10/11/2016   PLT 89 (L) 10/11/2016    Recent Labs Lab 10/11/16 0340  NA 139  K 3.4*  CL 106  CO2 24  BUN 18  CREATININE 1.08  CALCIUM 9.7  PROT 6.9  BILITOT 1.2  ALKPHOS 103  ALT 27  AST 29  GLUCOSE 142*   Lab Results  Component Value Date   TROPONINI 0.26 ([place]) 10/11/2016      Radiology:  Nm Pet Image Initial (pi) Skull Base To Thigh  Result Date: 10/10/2016 CLINICAL DATA:  Initial treatment strategy for abdominal lymphadenopathy. EXAM: NUCLEAR MEDICINE PET SKULL BASE TO THIGH TECHNIQUE: 11.6 mCi F-18 FDG was injected intravenously. Full-ring PET  imaging was performed from the skull base to thigh after the radiotracer. CT data was obtained and used for attenuation correction and anatomic localization. FASTING BLOOD GLUCOSE:  Value: 106 mg/dl COMPARISON:  CT 09/28/2016 FINDINGS: NECK No hypermetabolic lymph nodes in the neck. CHEST Hypermetabolic nodule along the distal esophagus with SUV max equal 6.4 just above the GE junction. No additional hypermetabolic mediastinal adenopathy. No suspicious pulmonary nodules. There are bilateral small pleural effusions. ABDOMEN/PELVIS There are 2 foci of metabolic activity within which appear to be within the wall of the stomach along the posterior aspect of the greater curvature. These discrete nodules are intense with SUV max equal 12.9. Discrete lesions are not evident on the CT portion exam. No clear lesion  on comparison CT. Adjacent to the stomach within the gastrosplenic ligament there 2 rounded necrotic lymph nodes with SUV max equal 11.5 and measuring 2.2 cm There is hypermetabolic lymph node in the gastrohepatic ligament which is similar with SUV max equal 13.5 measuring 2.3 cm. There are enlarged hypermetabolic porta hepatis lymph nodes which are similar. Several upper abdominal retroperitoneal lymph nodes along the aorta and IVC intense metabolic activity (SUV max equal 12.1). No hypermetabolic lymph nodes in the pelvis. SKELETON No focal hypermetabolic activity to suggest skeletal metastasis. IMPRESSION: 1. Hypermetabolic gastrohepatic ligament lymph nodes, gastrosplenic lymph nodes, porta hepatis lymph nodes and distal paraesophageal lymph nodes consistent with metastatic lymph nodes. 2. Two foci within the wall of the stomach along the posterior aspect of the greater curvature. 3. Differential for above findings include gastric carcinoma, esophageal carcinoma, gastrointestinal stromal tumor, or lymphoma. 4. Recommend upper endoscopy / EUS with sampling of the perigastric lymph nodes and potentially gastric  wall lesions if identifiable. These results will be called to the ordering clinician or representative by the Radiologist Assistant, and communication documented in the PACS or zVision Dashboard. Electronically Signed   By: Suzy Bouchard M.D.   On: 10/10/2016 17:55   Dg Chest Port 1 View  Result Date: 10/11/2016 CLINICAL DATA:  Chest pain EXAM: PORTABLE CHEST 1 VIEW COMPARISON:  Chest CT 09/27/2016 FINDINGS: There is diffuse pulmonary edema. Cardiomediastinal silhouette is normal. No large pneumothorax or pleural effusion. IMPRESSION: Moderate pulmonary edema. Electronically Signed   By: Ulyses Jarred M.D.   On: 10/11/2016 04:09    EKG:  Normal sinus rhythm with significant ST elevation V4 through V6, II, III, and aVF compatible with anteroapical infarction with inferior extension. This finding is new when compared to the tracing performed on 09/25/2016.  ASSESSMENT:  1. Acute anteroapical ST elevation myocardial infarction in a patient with recent vascular thrombotic problems including multiple embolic CVA and DVT with pulmonary embolism. 2. Abdominal lymphadenopathy. Rule out lymphoma or other malignancy (question metastatic) with hypercoagulable state. 3. Embolic CVA 46/65/9935 4. DVT and pulmonary embolus 09/10/16 5. Anticoagulation therapy with Eliquis, last dose 7 PM 10/10/16  Plan:  This is a higher than usual risk patient due to the obvious acute anterior myocardial infarction complicated by active anticoagulation, recent embolic CVAs, and recent pulmonary emboli.  Despite these issues, this anterior infarction must be treated with recanalization and stenting of possible to improve outcome and preserve LV function. Under emergency circumstances, I described to the patient the elevated risk of proceeding with PCI and he excepted the procedure and potential complications including recurrent CVA or intracranial bleeding, pulmonary embolism, hemorrhage, death, worsening of infarction, limb  ischemia, and other potentially less severe complications.  Anticoagulation strategy will be a challenge. We will use heparin acutely in the cath lab. If stenting is performed Plavix will be the antiplatelet agent of choice.  May need workup for hypercoagulability. Has been recently seen in the oncology clinic by Dr. Talbert Cage  Critically ill patient with anterior STEMI and multiple complicating issues. Critical care time 40 minutes. Time was spent in face-to-face management as well as cord [place] of care.   Illene Labrador, III, M.D. 6:33 AM, 10/11/2016

## 2016-10-11 NOTE — Progress Notes (Signed)
CRITICAL VALUE ALERT  Critical value received: Troponin >65  Date of notification:  10/11/16  Time of notification:  0805  Critical value read back:Yes.    Nurse who received alert:  Blase Mess  MD notified (1st page):  Cardiology PA Strader  Time of first page:  0806  MD notified (2nd page):  Time of second page:  Responding MD:  Bernerd Pho  Time MD responded:  [phone]

## 2016-10-11 NOTE — ED Provider Notes (Signed)
[facility] DEPT Provider Note   CSN: [id number] Arrival date & time: 10/11/16  [id number]     History   Chief Complaint Chief Complaint  Patient presents with  . Chest Pain    HPI [name] is a 66 y.o. male.  LEVEL 5 CAVEAT DUE TO ACUITY OF CONDITION  The history is provided by the spouse and the patient. The history is limited by the condition of the patient.  Chest Pain   This is a new problem. The current episode started 1 to 2 hours ago. The problem occurs constantly. The problem has been rapidly worsening. The pain is present in the substernal region. The pain is at a severity of 10/10. The pain is severe. Quality: crushing. The pain does not radiate. Associated symptoms include diaphoresis and shortness of breath. Pertinent negatives include no syncope. He has tried nothing for the symptoms.  Patient woke up with crushing chest pain, shortness of breath and diaphoresis He went to bed feeling well He has h/o recent stroke and also recent h/o PE He takes eliquis daily and has not missed a dose  No new weakness reported He has baseline expressive aphasia from recent stroke  Past Medical History:  Diagnosis Date  . Depression   . DVT (deep venous thrombosis) ([place])   . GERD (gastroesophageal reflux disease)   . 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 Hyperlipidemia   . Hypertension   . PE (pulmonary thromboembolism) ([place])   . Stroke [facility])     Patient Active Problem List   Diagnosis Date Noted  . Lymphadenopathy, abdominal 10/03/2016  . Stroke ([place]) 09/25/2016  . Pulmonary emboli ([place]) 09/10/2016  . Acute DVT (deep venous thrombosis) ([place]) 09/10/2016  . Hyponatremia 09/10/2016  . Normocytic anemia 09/10/2016  . Acute pulmonary embolism ([place]) 09/10/2016  . Essential hypertension, benign 06/10/2015  . Depression 06/10/2015  . Hyperlipidemia LDL goal <130 06/10/2015  . GERD (gastroesophageal reflux disease) 06/10/2015    Past Surgical History:  Procedure  Laterality Date  . adnoidectomy    . APPENDECTOMY    . PROSTATE SURGERY    . TONSILLECTOMY AND ADENOIDECTOMY         Home Medications    Prior to Admission medications   Medication Sig Start Date End Date Taking? Authorizing Provider  amLODipine (NORVASC) 5 MG tablet Take 1 tablet (5 mg total) by mouth daily. 11/12/15  Yes Fransisca Kaufmann Dettinger, MD  apixaban (ELIQUIS) 5 MG TABS tablet Take 1 tablet (5 mg total) by mouth 2 (two) times daily. 09/29/16  Yes Eber Jones, MD  atorvastatin (LIPITOR) 80 MG tablet Take 1 tablet (80 mg total) by mouth daily. Patient taking differently: Take 80 mg by mouth at bedtime.  11/12/15  Yes Fransisca Kaufmann Dettinger, MD  escitalopram (LEXAPRO) 20 MG tablet Take 1 tablet (20 mg total) by mouth daily. 07/25/16  Yes Fransisca Kaufmann Dettinger, MD  omeprazole (PRILOSEC) 20 MG capsule Take 1 capsule (20 mg total) by mouth daily. 11/12/15  Yes Fransisca Kaufmann Dettinger, MD  Cyanocobalamin (VITAMIN B-12 PO) Take 1 tablet by mouth once.    Historical Provider, MD  traMADol (ULTRAM) 50 MG tablet Take 1 tablet (50 mg total) by mouth every 6 (six) hours as needed. 10/07/16   Milton Ferguson, MD  TURMERIC PO Take 1 tablet by mouth daily as needed (joint pain).    Historical Provider, MD    Family History Family History  Problem Relation Age of Onset  . COPD  Mother   . Heart disease Mother   . AAA (abdominal aortic aneurysm) Mother   . Heart disease Father   . Depression Father   . Diabetes Father   . Heart disease Brother   . Early death Brother     heart attack  . Heart disease Maternal Grandmother   . Heart disease Maternal Grandfather   . Stroke Paternal Grandmother   . Heart disease Brother   . Diabetes Brother     Social History Social History  Substance Use Topics  . Smoking status: Never Smoker  . Smokeless tobacco: Never Used  . Alcohol use 0.0 oz/week     Comment: occasional     Allergies   Patient has no known allergies.   Review of Systems Review of  Systems  Unable to perform ROS: Acuity of condition  Constitutional: Positive for diaphoresis.  Respiratory: Positive for shortness of breath.   Cardiovascular: Positive for chest pain. Negative for syncope.     Physical Exam Updated Vital Signs BP 143/84   Pulse 90   Temp 97.7 F (36.5 C) (Oral)   Resp 14   Ht 6\' 1"  (1.854 m)   Wt 106.6 kg   SpO2 97%   BMI 31.00 kg/m   Physical Exam CONSTITUTIONAL: elderly, ill appearing, diaphoretic HEAD: Normocephalic/atraumatic EYES: EOMI ENMT: Mucous membranes moist NECK: supple no meningeal signs SPINE/BACK:entire spine nontender CV: S1/S2 noted, no murmurs/rubs/gallops noted LUNGS: crackles bilaterally, no apparent distress ABDOMEN: soft, nontender GU:no cva tenderness NEURO: Pt is awake/alert/appropriate, moves all extremitiesx4.  No facial droop. Mild expressive aphasia noted  EXTREMITIES: pulses normal/equalx4, full ROM SKIN: warm, color normal, diaphoresis PSYCH: anxious ED Treatments / Results  Labs (all labs ordered are listed, but only abnormal results are displayed) Labs Reviewed  CBC  DIFFERENTIAL  PROTIME-INR  APTT  COMPREHENSIVE METABOLIC PANEL  TROPONIN I  LIPID PANEL    EKG  EKG Interpretation  Date/Time:  Tuesday October 11 2016 03:36:01 EST Ventricular Rate:  94 PR Interval:    QRS Duration: 132 QT Interval:  367 QTC Calculation: 459 R Axis:   -37 Text Interpretation:  Sinus rhythm Left atrial enlargement Right bundle branch block Inferior infarct, acute Probable anterolateral infarct, acute ** ** ACUTE MI / STEMI ** ** Confirmed by Christy Gentles  MD, Kymberlie Brazeau ([id number]) on 10/11/2016 3:53:29 AM       Radiology Nm Pet Image Initial (pi) Skull Base To Thigh  Result Date: 10/10/2016 CLINICAL DATA:  Initial treatment strategy for abdominal lymphadenopathy. EXAM: NUCLEAR MEDICINE PET SKULL BASE TO THIGH TECHNIQUE: 11.6 mCi F-18 FDG was injected intravenously. Full-ring PET imaging was performed from the skull  base to thigh after the radiotracer. CT data was obtained and used for attenuation correction and anatomic localization. FASTING BLOOD GLUCOSE:  Value: 106 mg/dl COMPARISON:  CT 09/28/2016 FINDINGS: NECK No hypermetabolic lymph nodes in the neck. CHEST Hypermetabolic nodule along the distal esophagus with SUV max equal 6.4 just above the GE junction. No additional hypermetabolic mediastinal adenopathy. No suspicious pulmonary nodules. There are bilateral small pleural effusions. ABDOMEN/PELVIS There are 2 foci of metabolic activity within which appear to be within the wall of the stomach along the posterior aspect of the greater curvature. These discrete nodules are intense with SUV max equal 12.9. Discrete lesions are not evident on the CT portion exam. No clear lesion on comparison CT. Adjacent to the stomach within the gastrosplenic ligament there 2 rounded necrotic lymph nodes with SUV max equal 11.5 and measuring 2.2 cm  There is hypermetabolic lymph node in the gastrohepatic ligament which is similar with SUV max equal 13.5 measuring 2.3 cm. There are enlarged hypermetabolic porta hepatis lymph nodes which are similar. Several upper abdominal retroperitoneal lymph nodes along the aorta and IVC intense metabolic activity (SUV max equal 12.1). No hypermetabolic lymph nodes in the pelvis. SKELETON No focal hypermetabolic activity to suggest skeletal metastasis. IMPRESSION: 1. Hypermetabolic gastrohepatic ligament lymph nodes, gastrosplenic lymph nodes, porta hepatis lymph nodes and distal paraesophageal lymph nodes consistent with metastatic lymph nodes. 2. Two foci within the wall of the stomach along the posterior aspect of the greater curvature. 3. Differential for above findings include gastric carcinoma, esophageal carcinoma, gastrointestinal stromal tumor, or lymphoma. 4. Recommend upper endoscopy / EUS with sampling of the perigastric lymph nodes and potentially gastric wall lesions if identifiable. These  results will be called to the ordering clinician or representative by the Radiologist Assistant, and communication documented in the PACS or zVision Dashboard. Electronically Signed   By: Suzy Bouchard M.D.   On: 10/10/2016 17:55    Procedures Procedures  CRITICAL CARE Performed by: Sharyon Cable Total critical care time: 31 minutes Critical care time was exclusive of separately billable procedures and treating other patients. Critical care was necessary to treat or prevent imminent or life-threatening deterioration. Critical care was time spent personally by me on the following activities: development of treatment plan with patient and/or surrogate as well as nursing, discussions with consultants, evaluation of patient's response to treatment, examination of patient, obtaining history from patient or surrogate, ordering and performing treatments and interventions, ordering and review of laboratory studies, ordering and review of radiographic studies, pulse oximetry and re-evaluation of patient's condition. PATIENT WITH STEMI REQUIRING TRANSFER TO CARDIAC CENTER  Medications Ordered in ED Medications  0.9 %  sodium chloride infusion (10 mL/hr Intravenous New Bag/Given 10/11/16 0352)  aspirin chewable tablet 324 mg (324 mg Oral Given 10/11/16 0351)  nitroGLYCERIN (NITROSTAT) 0.4 MG SL tablet (0.4 mg  Given 10/11/16 0351)     Initial Impression / Assessment and Plan / ED Course  I have reviewed the triage vital signs and the nursing notes.      PT SEEN ON ARRIVAL HE IS HERE FOR CRUSHING CHEST PAIN/SOB/DIAPHORESIS HE HAS SIGNIFICANT ST ELEVATION ON EKG CONCERN FOR STEMI ASA/NTG GIVEN (HE HAD SOME PAIN RELIEF WITH NTG) DEFER HEPARIN AS HE IS ON ELIQUIS WIFE CONFIRMS STORY THAT HE WAS AT BASELINE/PAIN FREE AT BEDTIME (2200 ON 2/19)  I DISCUSSED CASE WITH DR Jeralene Peters [facility] HE ACCEPTS IN TRANSFER TO [place] FOR CARDIAC CATHETERIZATION   Final Clinical Impressions(s) / ED  Diagnoses   Final diagnoses:  ST elevation myocardial infarction (STEMI), unspecified artery [facility])    New Prescriptions New Prescriptions   No medications on file     Ripley Fraise, MD 10/11/16 0403

## 2016-10-11 NOTE — Consult Note (Signed)
[name] Kitchen    HEMATOLOGY/ONCOLOGY CONSULTATION NOTE  Date of Service: 10/11/2016  Patient Care Team: Fransisca Kaufmann Dettinger, MD as PCP - General (Family Medicine)  CHIEF COMPLAINTS/PURPOSE OF CONSULTATION:  Extensive b/l lower extremity DVT and b/l large volume pulmonary embolism Extensive embolic CVA's Abdominal LNadenopathy and stomach lesions concerning for malignancy Admitted with Acute STEMI s/p stenting.   HISTORY OF PRESENTING ILLNESS:   [name] is a wonderful 66 y.o. male with a very complicated medical presentation. He had apparently presented with b/l extensive DVT and pulmonary embolism on 09/10/2016 and was started on Xarelto with loading dose followed standard once daily therapeutic dosing.  He notes his breathing was improving and he was not on oxygen at home . He subsequently presented to the ED with confusion and AMS for 1-2 days and had imaging which showed Multiple embolic infarctions scattered throughout the cerebellum and both cerebral hemispheres consistent with embolic disease from the heart or ascending aorta. Most extensive in the region of involvement is within the left MCA territory where there appears to be a missing left M2 branch. No large confluent infarction however. No hemorrhage, swelling or mass effect.  Patient had a CT angiogram of Chest and Abd which upper abdominal lesions and then he had a dedicated CT abd which showed Abdominal lymphadenopathy in retroperitoneum, porta hepatis,gastrohepatic and gastrosplenic ligaments, and inferior mediastinum. Differential diagnosis includes metastatic disease and lymphoma. Mildly enlarged prostate, with focal contrast enhancement in the right anterior mid gland which may be due to prostatitis or prostate carcinoma. No pelvic lymphadenopathy identified. PSA levels WNL  His Xarelto was changed to Eliquis 5mg  po BID. Patient and his wife note that his CVA symptoms were improving and that his verbal recall was getting  better. He still notes issues with balance (probably from cerebellar involvement).  He was on Eliquis and the abdominal LNadenopathy was not easy to biopsy so he had a PET/CT to further evaluate the disease. PET/CT was done on 10/10/2016 and showed Two FDG avid foci within the wall of the stomach along the posterior aspect of the greater curvature. These are fairly FDG avid SUV 12.9. No overt discrete lesions on CT. Hypermetabolic gastrohepatic ligament lymph nodes, gastrosplenic lymph nodes, porta hepatis lymph nodes and distal paraesophageal lymph nodes consistent with metastatic lymph nodes.  Patient unfortunate presented this AM with an STEMI with troponin >65 and extensive antero-apical wall motion abnormality and some heart failure symptomatology. He has since had DES stenting of his mid LAD with a Synergy stent.  Patient is currently chest pain free and is recovering in the ICU.  I was consulted for further recommendation regarding his concerning picture of malignancy and recommendations regarding workup.    MEDICAL HISTORY:  Past Medical History:  Diagnosis Date  . Depression   . DVT (deep venous thrombosis) ([place])   . GERD (gastroesophageal reflux disease)   . 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 Hyperlipidemia   . Hypertension   . PE (pulmonary thromboembolism) ([place])   . Stroke [facility])     SURGICAL HISTORY: Past Surgical History:  Procedure Laterality Date  . adnoidectomy    . APPENDECTOMY    . CORONARY STENT INTERVENTION N/A 10/11/2016   Procedure: Coronary Stent Intervention;  Surgeon: Belva Crome, MD;  Location: [facility];  Service: Cardiovascular;  Laterality: N/A;  . LEFT HEART CATH AND CORONARY ANGIOGRAPHY N/A 10/11/2016   Procedure: Left Heart Cath and Coronary Angiography;  Surgeon: Belva Crome,  MD;  Location: [place] [facility];  Service: Cardiovascular;  Laterality: N/A;  . PROSTATE SURGERY    . TONSILLECTOMY AND ADENOIDECTOMY      SOCIAL  HISTORY: Social History   Social History  . Marital status: Married    Spouse name: N/A  . Number of children: N/A  . Years of education: N/A   Occupational History  . Not on file.   Social History Main Topics  . Smoking status: Never Smoker  . Smokeless tobacco: Never Used  . Alcohol use 0.0 oz/week     Comment: occasional  . Drug use: No  . Sexual activity: Yes    Birth control/ protection: Post-menopausal     Comment: Married for 16 years   Other Topics Concern  . Not on file   Social History Narrative   Epworth Sleepiness Scale = 12 (as of 09/09/2015)    FAMILY HISTORY: Family History  Problem Relation Age of Onset  . COPD Mother   . Heart disease Mother   . AAA (abdominal aortic aneurysm) Mother   . Heart disease Father   . Depression Father   . Diabetes Father   . Heart disease Brother   . Early death Brother     heart attack  . Heart disease Maternal Grandmother   . Heart disease Maternal Grandfather   . Stroke Paternal Grandmother   . Heart disease Brother   . Diabetes Brother     ALLERGIES:  has No Known Allergies.  MEDICATIONS:  Current Facility-Administered Medications  Medication Dose Route Frequency Provider Last Rate Last Dose  . 0.9 %  sodium chloride infusion  250 mL Intravenous PRN Belva Crome, MD      . acetaminophen (TYLENOL) tablet 650 mg  650 mg Oral Q4H PRN Belva Crome, MD      . apixaban Arne Cleveland) tablet 5 mg  5 mg Oral BID Belva Crome, MD      . aspirin chewable tablet 81 mg  81 mg Oral Daily Belva Crome, MD      . atorvastatin (LIPITOR) tablet 80 mg  80 mg Oral q1800 Belva Crome, MD      . carvedilol (COREG) tablet 3.125 mg  3.125 mg Oral BID WC Belva Crome, MD      . clopidogrel (PLAVIX) tablet 75 mg  75 mg Oral Q breakfast Belva Crome, MD      . escitalopram Loma Sousa) tablet 20 mg  20 mg Oral Daily Belva Crome, MD   20 mg at 10/11/16 0913  . [START ON 10/12/2016] lisinopril (PRINIVIL,ZESTRIL) tablet 2.5 mg  2.5 mg  Oral Daily Belva Crome, MD      . morphine 2 MG/ML injection 2 mg  2 mg Intravenous Q1H PRN Belva Crome, MD      . nitroGLYCERIN 50 mg in dextrose 5 % 250 mL (0.2 mg/mL) infusion  10-20 mcg/min Intravenous Titrated Belva Crome, MD 3 mL/hr at 10/11/16 0712 10 mcg/min at 10/11/16 [id number]  . ondansetron (ZOFRAN) injection 4 mg  4 mg Intravenous Q6H PRN Belva Crome, MD      . oxyCODONE-acetaminophen (PERCOCET/ROXICET) 5-325 MG per tablet 1-2 tablet  1-2 tablet Oral Q4H PRN Belva Crome, MD      . sodium chloride flush (NS) 0.9 % injection 3 mL  3 mL Intravenous Q12H Belva Crome, MD   3 mL at 10/11/16 0914  . sodium chloride flush (NS) 0.9 % injection 3  mL  3 mL Intravenous PRN Belva Crome, MD        REVIEW OF SYSTEMS:    10 Point review of Systems was done is negative except as noted above.  PHYSICAL EXAMINATION: ECOG PERFORMANCE STATUS: 2 - Symptomatic, <50% confined to bed  . Vitals:   10/11/16 1639 10/11/16 1700  BP:  130/78  Pulse:  89  Resp:  (!) 27  Temp: 98 F (36.7 C)    Filed Weights   10/11/16 0342  Weight: 235 lb (106.6 kg)   .Body mass index is 31 kg/m.  GENERAL:alert, in no acute distress and comfortable SKIN: skin color, texture, turgor are normal, no rashes or significant lesions EYES: normal, conjunctiva are pink and non-injected, sclera clear OROPHARYNX:no exudate, no erythema and lips, buccal mucosa, and tongue normal  NECK: supple, no JVD, thyroid normal size, non-tender, without nodularity LYMPH:  no palpable lymphadenopathy in the cervical, axillary or inguinal LUNGS: clear to auscultation with normal respiratory effort HEART: regular rate & rhythm,  no murmurs and b/l lower extremity edema 1-2+ ABDOMEN: abdomen soft, non-tender, normoactive bowel sounds  PSYCH: alert & oriented x 3 with fluent speech NEURO: no focal motor/sensory deficits  LABORATORY DATA:  I have reviewed the data as listed  . CBC Latest Ref Rng & Units 10/11/2016 10/03/2016  09/27/2016  WBC 4.0 - 10.5 K/uL 8.6 6.2 6.5  Hemoglobin 13.0 - 17.0 g/dL 12.8(L) 12.0(L) 11.7(L)  Hematocrit 39.0 - 52.0 % 37.8(L) 35.2(L) 34.4(L)  Platelets 150 - 400 K/uL 89(L) 154 171    . CMP Latest Ref Rng & Units 10/11/2016 10/03/2016 09/27/2016  Glucose 65 - 99 mg/dL 142(H) 134(H) 109(H)  BUN 6 - 20 mg/dL 18 18 17   Creatinine 0.61 - 1.24 mg/dL 1.08 1.07 0.86  Sodium 135 - 145 mmol/L 139 136 138  Potassium 3.5 - 5.1 mmol/L 3.4(L) 3.6 3.6  Chloride 101 - 111 mmol/L 106 100(L) 105  CO2 22 - 32 mmol/L 24 28 27   Calcium 8.9 - 10.3 mg/dL 9.7 9.7 9.2  Total Protein 6.5 - 8.1 g/dL 6.9 6.6 -  Total Bilirubin 0.3 - 1.2 mg/dL 1.2 0.8 -  Alkaline Phos 38 - 126 U/L 103 104 -  AST 15 - 41 U/L 29 22 -  ALT 17 - 63 U/L 27 21 -    RADIOGRAPHIC STUDIES: I have personally reviewed the radiological images as listed and agreed with the findings in the report. Mr Brain Wo Contrast  Result Date: 09/26/2016 CLINICAL DATA:  Confusion and altered mental status over the last 2 days. EXAM: MRI HEAD WITHOUT CONTRAST MRA HEAD WITHOUT CONTRAST TECHNIQUE: Multiplanar, multiecho pulse sequences of the brain and surrounding structures were obtained without intravenous contrast. Angiographic images of the head were obtained using MRA technique without contrast. COMPARISON:  CT 09/25/2016 FINDINGS: MRI HEAD FINDINGS Brain: There is scattered punctate acute infarctions scattered throughout all vascular territories of the anterior and posterior circulation. These are most extensive in the left MCA territory were there are dozens of small discrete infarctions. No large confluent infarction. The findings are consistent with embolic infarctions from the heart or ascending aorta. The brain does not show a background pattern of chronic disease. No evidence of mass lesion, hemorrhage, hydrocephalus or extra-axial collection. Vascular: Major vessels at the base of the brain show flow. Skull and upper cervical spine: Negative  Sinuses/Orbits: Clear/normal Other: None MRA HEAD FINDINGS Both internal carotid arteries are widely patent into the brain. The anterior and middle cerebral vessels  are patent without proximal stenosis, aneurysm or vascular malformation. There is probably an occluded M2 branch of the left middle cerebral artery. No posterior circulation large or medium vessel abnormality is seen. Incidental vertebral fenestration. 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 Most extensive in the region of involvement is within the left MCA territory where there appears to be a missing left M2 branch. No large confluent infarction however. No hemorrhage, swelling or mass effect. No pre-existing brain disease identified. Electronically Signed   By: Nelson Chimes M.D.   On: 09/26/2016 10:41   Ct Abdomen Pelvis W Contrast  Result Date: 09/28/2016 CLINICAL DATA:  Abdominal lymphadenopathy. EXAM: CT ABDOMEN AND PELVIS WITH CONTRAST TECHNIQUE: Multidetector CT imaging of the abdomen and pelvis was performed using the standard protocol following bolus administration of intravenous contrast. CONTRAST:  146mL ISOVUE-300 IOPAMIDOL (ISOVUE-300) INJECTION 61% COMPARISON:  Chest CTA on 09/27/2016 FINDINGS: Lower Chest: Tiny left pleural effusion and left basilar atelectasis. Mild lymphadenopathy in the inferior mediastinum along the posterior aspect of the distal esophagus. Hepatobiliary:  No masses identified. Gallbladder is unremarkable. Pancreas:  No mass or inflammatory changes. Spleen: Within normal limits in size and appearance. Adrenals/Urinary Tract: No masses identified. No evidence of hydronephrosis. Stomach/Bowel: No evidence of obstruction, inflammatory process or abnormal fluid collections. Vascular/Lymphatic: Lymphadenopathy is seen in the gastrohepatic and gastrosplenic ligaments and porta hepatis,  with index lymph node in the porta hepatis measure 2.7 cm on image 29/2. Mild retroperitoneal lymphadenopathy is also seen in the aorta caval and left paraaortic spaces, with index lymph node in the portacaval space are measuring 1.7 cm on image 41/2. No pathologically enlarged lymph nodes identified within the pelvis. Aortic atherosclerosis.  No abdominal aortic aneurysm. Reproductive: Mildly enlarged prostate, with focal contrast enhancement in right anterior mid gland, which may be due to prostatitis or prostate carcinoma. Normal appearance of seminal vesicles. Other:  None. Musculoskeletal:  No suspicious bone lesions identified. IMPRESSION: Abdominal lymphadenopathy in retroperitoneum, porta hepatis, gastrohepatic and gastrosplenic ligaments, and inferior mediastinum. Differential diagnosis includes metastatic disease and lymphoma. Mildly enlarged prostate, with focal contrast enhancement in the right anterior mid gland which may be due to prostatitis or prostate carcinoma. No pelvic lymphadenopathy identified. Electronically Signed   By: Earle Gell M.D.   On: 09/28/2016 15:53   US Carotid Bilateral (at [facility] Only)  Result Date: 09/26/2016 CLINICAL DATA:  CVA. EXAM: BILATERAL CAROTID DUPLEX ULTRASOUND TECHNIQUE: Pearline Cables scale imaging, color Doppler and duplex ultrasound were performed of bilateral carotid and vertebral arteries in the neck. COMPARISON:  None. FINDINGS: Criteria: Quantification of carotid stenosis is based on velocity parameters that correlate the residual internal carotid diameter with NASCET-based stenosis levels, using the diameter of the distal internal carotid lumen as the denominator for stenosis measurement. The following velocity measurements were obtained: RIGHT ICA:  76 cm/sec CCA:  78 cm/sec SYSTOLIC ICA/CCA RATIO:  1.0 DIASTOLIC ICA/CCA RATIO:  1.6 ECA:  88 cm/sec LEFT ICA:  63 cm/sec CCA:  88 cm/sec SYSTOLIC ICA/CCA RATIO:  0.7 DIASTOLIC ICA/CCA RATIO:  1.6 ECA:  96 cm/sec  RIGHT CAROTID ARTERY: Small amount of plaque at the right carotid bulb without significant stenosis. External carotid artery is patent with normal waveform. Minimal plaque at the origin of the internal carotid artery. Normal waveforms and velocities in the internal carotid artery. RIGHT VERTEBRAL ARTERY: Antegrade flow and normal waveform in the right vertebral artery. LEFT CAROTID  ARTERY: Minimal plaque at the left carotid bulb. External carotid artery is patent with normal waveform. Normal waveforms and velocities in the internal carotid artery. LEFT VERTEBRAL ARTERY: Antegrade flow and normal waveform in the left vertebral artery. IMPRESSION: Minimal atherosclerotic disease in the carotid arteries. No significant carotid artery stenosis. Estimated degree of stenosis in the internal carotid arteries is less than 50% bilaterally. Patent vertebral arteries with antegrade flow. Electronically Signed   By: Markus Daft M.D.   On: 09/26/2016 09:51   Nm Pet Image Initial (pi) Skull Base To Thigh  Result Date: 10/10/2016 CLINICAL DATA:  Initial treatment strategy for abdominal lymphadenopathy. EXAM: NUCLEAR MEDICINE PET SKULL BASE TO THIGH TECHNIQUE: 11.6 mCi F-18 FDG was injected intravenously. Full-ring PET imaging was performed from the skull base to thigh after the radiotracer. CT data was obtained and used for attenuation correction and anatomic localization. FASTING BLOOD GLUCOSE:  Value: 106 mg/dl COMPARISON:  CT 09/28/2016 FINDINGS: NECK No hypermetabolic lymph nodes in the neck. CHEST Hypermetabolic nodule along the distal esophagus with SUV max equal 6.4 just above the GE junction. No additional hypermetabolic mediastinal adenopathy. No suspicious pulmonary nodules. There are bilateral small pleural effusions. ABDOMEN/PELVIS There are 2 foci of metabolic activity within which appear to be within the wall of the stomach along the posterior aspect of the greater curvature. These discrete nodules are intense  with SUV max equal 12.9. Discrete lesions are not evident on the CT portion exam. No clear lesion on comparison CT. Adjacent to the stomach within the gastrosplenic ligament there 2 rounded necrotic lymph nodes with SUV max equal 11.5 and measuring 2.2 cm There is hypermetabolic lymph node in the gastrohepatic ligament which is similar with SUV max equal 13.5 measuring 2.3 cm. There are enlarged hypermetabolic porta hepatis lymph nodes which are similar. Several upper abdominal retroperitoneal lymph nodes along the aorta and IVC intense metabolic activity (SUV max equal 12.1). No hypermetabolic lymph nodes in the pelvis. SKELETON No focal hypermetabolic activity to suggest skeletal metastasis. IMPRESSION: 1. Hypermetabolic gastrohepatic ligament lymph nodes, gastrosplenic lymph nodes, porta hepatis lymph nodes and distal paraesophageal lymph nodes consistent with metastatic lymph nodes. 2. Two foci within the wall of the stomach along the posterior aspect of the greater curvature. 3. Differential for above findings include gastric carcinoma, esophageal carcinoma, gastrointestinal stromal tumor, or lymphoma. 4. Recommend upper endoscopy / EUS with sampling of the perigastric lymph nodes and potentially gastric wall lesions if identifiable. These results will be called to the ordering clinician or representative by the Radiologist Assistant, and communication documented in the PACS or zVision Dashboard. Electronically Signed   By: Suzy Bouchard M.D.   On: 10/10/2016 17:55   US Venous Img Lower Unilateral Left  Result Date: 10/07/2016 CLINICAL DATA:  Left groin pain for 2 weeks. History of blood clots. EXAM: LEFT LOWER EXTREMITY VENOUS DOPPLER ULTRASOUND TECHNIQUE: Gray-scale sonography with graded compression, as well as color Doppler and duplex ultrasound were performed to evaluate the lower extremity deep venous systems from the level of the common femoral vein and including the common femoral, femoral,  profunda femoral, popliteal and calf veins including the posterior tibial, peroneal and gastrocnemius veins when visible. The superficial great saphenous vein was also interrogated. Spectral Doppler was utilized to evaluate flow at rest and with distal augmentation maneuvers in the common femoral, femoral and popliteal veins. COMPARISON:  None. FINDINGS: Contralateral Common Femoral Vein: Respiratory phasicity is normal and symmetric with the symptomatic side. No evidence of thrombus. Normal compressibility.  Common Femoral Vein: No evidence of thrombus. Normal compressibility, respiratory phasicity and response to augmentation. Saphenofemoral Junction: No evidence of thrombus. Normal compressibility and flow on color Doppler imaging. Profunda Femoral Vein: No evidence of thrombus. Normal compressibility and flow on color Doppler imaging. Femoral Vein: No evidence of thrombus. Normal compressibility, respiratory phasicity and response to augmentation. Popliteal Vein: No evidence of thrombus. Normal compressibility, respiratory phasicity and response to augmentation. Calf Veins: No evidence of thrombus. Normal compressibility and flow on color Doppler imaging. Superficial Great Saphenous Vein: No evidence of thrombus. Normal compressibility and flow on color Doppler imaging. Venous Reflux:  None. Other Findings:  None. IMPRESSION: No evidence of deep venous thrombosis. Electronically Signed   By: Lajean Manes M.D.   On: 10/07/2016 12:14   US Aorta  Result Date: 09/21/2016 CLINICAL DATA:  Family history of abdominal aortic aneurysm EXAM: ULTRASOUND OF ABDOMINAL AORTA TECHNIQUE: Ultrasound examination of the abdominal aorta was performed to evaluate for abdominal aortic aneurysm. COMPARISON:  None. FINDINGS: Abdominal Aorta No aneurysm identified. Maximum Diameter: Proximal aorta measures 2.5 x 2.8 cm in diameter. Mid aorta measures 2.1 x 2 cm in diameter. Distal aorta measures 1.9 x 2 cm in diameter. Right common  iliac artery measures 0.9 x 1 cm in diameter. Left common iliac artery measures 0.9 x 1 cm in diameter. IMPRESSION: No evidence of abdominal aortic aneurysm. Abdominal aorta measures 2.5 x 2.8 cm maximum diameter proximally. Electronically Signed   By: Lahoma Crocker M.D.   On: 09/21/2016 11:25   Dg Chest Port 1 View  Result Date: 10/11/2016 CLINICAL DATA:  Chest pain EXAM: PORTABLE CHEST 1 VIEW COMPARISON:  Chest CT 09/27/2016 FINDINGS: There is diffuse pulmonary edema. Cardiomediastinal silhouette is normal. No large pneumothorax or pleural effusion. IMPRESSION: Moderate pulmonary edema. Electronically Signed   By: Ulyses Jarred M.D.   On: 10/11/2016 04:09   Mr Jodene Nam Head/brain [id number] Cm  Result Date: 09/26/2016 CLINICAL DATA:  Confusion and altered mental status over the last 2 days. EXAM: MRI HEAD WITHOUT CONTRAST MRA HEAD WITHOUT CONTRAST TECHNIQUE: Multiplanar, multiecho pulse sequences of the brain and surrounding structures were obtained without intravenous contrast. Angiographic images of the head were obtained using MRA technique without contrast. COMPARISON:  CT 09/25/2016 FINDINGS: MRI HEAD FINDINGS Brain: There is scattered punctate acute infarctions scattered throughout all vascular territories of the anterior and posterior circulation. These are most extensive in the left MCA territory were there are dozens of small discrete infarctions. No large confluent infarction. The findings are consistent with embolic infarctions from the heart or ascending aorta. The brain does not show a background pattern of chronic disease. No evidence of mass lesion, hemorrhage, hydrocephalus or extra-axial collection. Vascular: Major vessels at the base of the brain show flow. Skull and upper cervical spine: Negative Sinuses/Orbits: Clear/normal Other: None MRA HEAD FINDINGS Both internal carotid arteries are widely patent into the brain. The anterior and middle cerebral vessels are patent without proximal stenosis, aneurysm  or vascular malformation. There is probably an occluded M2 branch of the left middle cerebral artery. No posterior circulation large or medium vessel abnormality is seen. Incidental vertebral fenestration. 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 Most extensive in the region of involvement is within the left MCA territory where there appears to be a missing left M2 branch. No large confluent infarction however. No hemorrhage, swelling or mass effect. No pre-existing brain disease identified. Electronically  Signed   By: Nelson Chimes M.D.   On: 09/26/2016 10:41   Ct Angio Chest Aorta W/cm &/or Wo/cm  Result Date: 09/27/2016 CLINICAL DATA:  New CVA. Questionable aortic dilation on transthoracic echo. EXAM: CT ANGIOGRAPHY CHEST WITH CONTRAST TECHNIQUE: Multidetector CT imaging of the chest was performed using the standard protocol during bolus administration of intravenous contrast. Multiplanar CT image reconstructions and MIPs were obtained to evaluate the vascular anatomy. CONTRAST:  100 cc Isovue 370 intravenously. COMPARISON:  None. FINDINGS: Cardiovascular: Satisfactory opacification of the pulmonary arteries to the segmental level. Filling defects within the pulmonary arterial branch to the left lower lobe and segmental pulmonary arterial branch in the left lung base likely represents residual pulmonary emboli. The remaining of the pulmonary arteries are normally opacified. Normal heart size. No pericardial effusion. The ascending thoracic aorta is mildly dilated measuring 4.4 cm in diameter. Mediastinum/Nodes: No enlarged mediastinal, hilar, or axillary lymph nodes. Thyroid gland, trachea, and esophagus demonstrate no significant findings. Small collection of lymph nodes versus paraesophageal varices is seen in the lower thorax. Lungs/Pleura: Persistent left lower lobe airspace  consolidation versus atelectasis or hypoperfusion changes. Upper Abdomen: Soft tissue masses seen within the gastrohepatic ligament, porta hepaticus and in the left upper quadrant along the splenic artery. Musculoskeletal: No chest wall abnormality. No acute or significant osseous findings. Review of the MIP images confirms the above findings. 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 Recommend followup by ultrasound in 3 years. This recommendation follows ACR consensus guidelines: White Paper of the ACR Incidental Findings Committee II on Vascular Findings. 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 Left lower lobe airspace consolidation, atelectasis or hypoperfusion changes. Nodular left pleural thickening. This may represent loculated pleural effusion, however pleural metastatic disease cannot be excluded. Soft tissue masses in gastrohepatic ligament, porta hepaticus and retroperitoneum of the upper abdomen, which may represent lymphadenopathy. Further evaluation with abdomen and pelvis CT with contrast is recommended. Periesophageal lymph nodes versus varices. These results were called by telephone at the time of interpretation on 09/27/2016 at 11:30 am to Dr. Nita Sells , who verbally acknowledged these results. Electronically Signed   By: Fidela Salisbury M.D.   On: 09/27/2016 11:40   Ct Head Code Stroke Wo Contrast`  Result Date: 09/25/2016 CLINICAL DATA:  Code stroke.  Confusion and dysphagia since 10 a.m. EXAM: CT HEAD WITHOUT CONTRAST TECHNIQUE: Contiguous axial images were obtained from the base of the skull through the vertex without intravenous contrast. COMPARISON:  None. FINDINGS: Brain: No evidence of acute infarction, hemorrhage, hydrocephalus, extra-axial collection or mass lesion/mass effect.  Vascular: Atherosclerotic calcification. Skull: No acute or aggressive finding. Mild enlargement of biparietal foramina. Sinuses/Orbits: Negative Other: These results were called by telephone at the time of interpretation on 09/25/2016 at 2:05 pm to Dr. Reather Converse, who verbally acknowledged these results. ASPECTS [facility] Stroke Program Early CT Score) - Ganglionic level infarction (caudate, lentiform nuclei, internal capsule, insula, M1-M3 cortex): 7 - Supraganglionic infarction (M4-M6 cortex): 3 Total score (0-10 with 10 being normal): 10 IMPRESSION: No acute finding. ASPECTS is 10. Electronically Signed   By: Monte Fantasia M.D.   On: 09/25/2016 14:06   US venous lower extremities 09/10/2016  Findings consistent with acute deep vein thrombosis involving the   right femoral vein, right popliteal vein, right posterior tibial   veins and right peroneal  vein. - Findings consistent with acute involving the left posterior   tibial veins and left peroneal veins.   ASSESSMENT & PLAN:   66 yo with complicated medical situation  1) Possible stomach lesions with upper abdominal /retroperitoneal FDG avid lymphadenopathy. Concerning for malignancy - ?gastric cancer vs GIST vs lymphoma vs other intra-abdominal metastatic malignancy.  2) Multiple thrombotic events (venous and arterial) ECHO no RT to left shunt. 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  Hypercoagulable status could certainly be related to his suspected malignancy. Some fhx of clotting issues in his mother per wife  Plan -its a challenge trying to obtain a tissue diagnosis for his suspected abdominal malignancy/lymphoma due to need for and difficulty with interrupting anticoagulation and no superficial lesions that are easily accessible for biopsy. -will get antiphosphospholipid antibody testing, FVL and Prothrombin gene mutation testing to r/o other hypercoag  concerns. -given concern with malignancy related hypercoagulable status would recommend switching from Eliquis to lovenox 1mg /kg q12h for ongoing anticoagulation. -with regarding to anticoagulation -- would be able to using IV heparin bridging in the next 3-4 weeks. However antiplatelet bridging would input input and clearance from cardiology perspective. -Endoscopic approach to evaluate stomach lesions and with EUS as needed to get biopsy would probably be the best approach. -will be available for additional question. -discussed recommendation, review PET/CT imaging and answered patients and his wife questions in details. -patient would need to be setup for medical oncology f/u on discharge with Dr Maylon Peppers (In [place]) or Dr [name] (in [place])  based on patient and wife preference.  All of the patients questions were answered with apparent satisfaction. The patient knows to call the clinic with any problems, questions or concerns.  I spent 60 minutes counseling the patient face to face. The total time spent in the appointment was 80 minutes and more than 50% was on counseling and direct patient cares.    Sullivan Lone MD [place] AAHIVMS [facility] Hematology/Oncology Physician [facility]  (Office):       [phone] (Work cell):  [phone] (Fax):           [phone]  10/11/2016 5:50 PM

## 2016-10-11 NOTE — Telephone Encounter (Signed)
Notified that patient has had an MI. Attempted to call the patient's wife Gay Filler. Called her cell phone twice but it was busy and also her home phone but no one picked up.

## 2016-10-11 NOTE — ED Notes (Signed)
Troponin 0.26 Dr. Christy Gentles Notified

## 2016-10-11 NOTE — Op Note (Signed)
   2 AM onset of severe chest pain awakening the patient from sleep. Initial EKG from [facility] demonstrated ST elevation anteriorly and inferior.  High risk history including embolic CVA in multiple areas within the past 2 weeks and history of DVT with PE within the last 2 months. On Eliquis anticoagulation therapy with last dose being given approximately 9 hours prior to arrival (7 PM).  Angiography demonstrated total occlusion of the mid LAD, 60-70% stenosis in the first obtuse marginal, otherwise widely patent circumflex and nondominant RCA.  Successful PCI and stenting of the mid LAD from 100% to 0% with TIMI grade 3 flow. 3.0 x 16 mm Synergy post dilated to 3.5 mm in diameter.

## 2016-10-11 NOTE — ED Triage Notes (Signed)
Patient woke up with crushing chest pain, sweating, short of breath.

## 2016-10-11 NOTE — Progress Notes (Signed)
  Asked to see the patient regarding dyspnea following catheterization. Presented earlier this morning as an acute anterior STEMI with DES to mid-LAD. Given Plavix load during cath. Scheduled to resume Eliquis this evening (recent PE and embolic CVA).   Given IV Lasix 20mg  at 0654 with over 400cc of output. Placed on Venti-mask as well. On examination, he is breathing more comfortably. Still with decreased breath sounds along left base. Will given additional IV Lasix 20mg  at 1000.  Signed, Erma Heritage, PA-C 10/11/2016, 7:45 AM Pager: [phone]

## 2016-10-11 NOTE — Progress Notes (Signed)
Good response to initial dose of diuretic, still with orthopnea, S3, bilateral wheezing and basal rales. Note LVEDP 28-45 at cath and extensive anteroapical wall motion abnormality, troponin>65. Early revascularization, hope to see substantial recovery of stunned myocardium over next several days, until then he is at risk for further HF decompensation and ventricular arrhythmia. Discussed issues surrounding workup of suspected abdominal malignancy/lymphoma, which is probably the underlying substrate for his multiple arterial and venous thrombotic complications occurring in rapid succession over the last few weeks. Workup delayed by challenge of obtaining diagnostic tissue while he needs dual antiplatelet and anticoagulant therapy. Will ask Heme/Onc for options for nonsurgical diagnosis or empirical treatment, at family's request. Cannot safely interrupt antiplatelet agents for minimum of 3 months. Overall should be on ASA/plavix for 12 months.  Sanda Klein, MD, [facility] [phone] office [phone] pager

## 2016-10-12 ENCOUNTER — Inpatient Hospital Stay (HOSPITAL_COMMUNITY): Payer: Medicare Other

## 2016-10-12 ENCOUNTER — Ambulatory Visit: Payer: Medicare Other | Admitting: Family Medicine

## 2016-10-12 DIAGNOSIS — I2510 Atherosclerotic heart disease of native coronary artery without angina pectoris: Secondary | ICD-10-CM

## 2016-10-12 LAB — BASIC METABOLIC PANEL
Anion gap: 12 (ref 5–15)
BUN: 19 mg/dL (ref 6–20)
CALCIUM: 9.5 mg/dL (ref 8.9–10.3)
CO2: 27 mmol/L (ref 22–32)
CREATININE: 1.02 mg/dL (ref 0.61–1.24)
Chloride: 98 mmol/L — ABNORMAL LOW (ref 101–111)
GFR calc Af Amer: >60 mL/min (ref >60)
Glucose, Bld: 129 mg/dL — ABNORMAL HIGH (ref 65–99)
POTASSIUM: 4.4 mmol/L (ref 3.5–5.1)
SODIUM: 137 mmol/L (ref 135–145)

## 2016-10-12 LAB — CBC
HCT: 35.8 % — ABNORMAL LOW (ref 39.0–52.0)
Hemoglobin: 11.7 g/dL — ABNORMAL LOW (ref 13.0–17.0)
MCH: 29.4 pg (ref 26.0–34.0)
MCHC: 32.7 g/dL (ref 30.0–36.0)
MCV: 89.9 fL (ref 78.0–100.0)
PLATELETS: 94 10*3/uL — AB (ref 150–400)
RBC: 3.98 MIL/uL — ABNORMAL LOW (ref 4.22–5.81)
RDW: 14.5 % (ref 11.5–15.5)
WBC: 9.2 10*3/uL (ref 4.0–10.5)

## 2016-10-12 LAB — ECHOCARDIOGRAM LIMITED
Height: 73 in
Weight: 3760 oz

## 2016-10-12 LAB — TROPONIN I: Troponin I: >65 ng/mL (ref <0.03)

## 2016-10-12 MED ORDER — ORAL CARE MOUTH RINSE
15.0000 mL | Freq: Two times a day (BID) | OROMUCOSAL | Status: DC
  Administered 2016-10-12 – 2016-10-16 (×6): 15 mL via OROMUCOSAL

## 2016-10-12 MED ORDER — PERFLUTREN LIPID MICROSPHERE
1.0000 mL | INTRAVENOUS | Status: AC | PRN
  Administered 2016-10-12: 3 mL via INTRAVENOUS
  Filled 2016-10-12: qty 10

## 2016-10-12 MED ORDER — ENOXAPARIN SODIUM 120 MG/0.8ML ~~LOC~~ SOLN
1.0000 mg/kg | Freq: Two times a day (BID) | SUBCUTANEOUS | Status: DC
  Administered 2016-10-12 – 2016-10-17 (×10): 105 mg via SUBCUTANEOUS
  Filled 2016-10-12 (×5): qty 0.71
  Filled 2016-10-12: qty 0.8
  Filled 2016-10-12 (×2): qty 0.71
  Filled 2016-10-12: qty 0.8
  Filled 2016-10-12 (×3): qty 0.71

## 2016-10-12 MED ORDER — PERFLUTREN LIPID MICROSPHERE
INTRAVENOUS | Status: AC
  Administered 2016-10-12: 3 mL via INTRAVENOUS
  Filled 2016-10-12: qty 10

## 2016-10-12 MED FILL — Tirofiban HCl in NaCl 0.9% IV Soln 5 MG/100ML (Base Equiv): INTRAVENOUS | Qty: 100 | Status: AC

## 2016-10-12 NOTE — Progress Notes (Signed)
CARDIAC REHAB PHASE I   PRE:  Rate/Rhythm: 95 SR  BP:  Sitting: 113/80        SaO2: 96 5L  MODE:  Ambulation: 180 ft   POST:  Rate/Rhythm: 109 ST  BP:  Sitting: 113/74         SaO2: 95 4L  Pt agreeable to walk, states he was supposed to have HHPT/OT but that they have not yet been to his house. Pt stood with min assist. Pt ambulated 180 ft on 4L O2, rolling walker, gait belt, assist x2, mostly steady gait, tolerated fair. Pt c/o feeling "swimmy headed," stopped suddenly twice and closed his eyes, pt appeared fatigued, requested to ambulate farther despite obvious fatigue, returned with pt to room. Upon return to room pt c/o mild DOE, difficulty following commands to return to recliner chair. VSS. Additionally, once sitting up in recliner, pt had two episodes of staring off, when questioned, pt unaware of this. Pt seems to have both receptive and expressive aphasia, slow to process information/commands. Discussed with RN who assessed pt, states neuro assessment unchanged from this morning. Pt would benefit from PT/OT and speech therapy consults to maximize therapy benefit while inpatient.  Pt to recliner after walk, feet elevated, call bell within reach. Will follow as x2.   [id number] Lenna Sciara, RN, BSN 10/12/2016 1:48 PM

## 2016-10-12 NOTE — Progress Notes (Signed)
Progress Note  Patient Name: [name] Date of Encounter: 10/12/2016  Primary Cardiologist: Tamala Julian  Subjective   No angina or dyspnea (at rest, has not walked yet). Orthopnea seems to have resolved. Still on O2  Inpatient Medications    Scheduled Meds: . apixaban  5 mg Oral BID  . aspirin  81 mg Oral Daily  . atorvastatin  80 mg Oral q1800  . carvedilol  3.125 mg Oral BID WC  . clopidogrel  75 mg Oral Q breakfast  . escitalopram  20 mg Oral Daily  . lisinopril  2.5 mg Oral Daily  . sodium chloride flush  3 mL Intravenous Q12H   Continuous Infusions:  PRN Meds: sodium chloride, acetaminophen, morphine injection, ondansetron (ZOFRAN) IV, oxyCODONE-acetaminophen, sodium chloride flush   Vital Signs    Vitals:   10/12/16 1000 10/12/16 1100 10/12/16 1155 10/12/16 1200  BP: 111/72 118/80  119/69  Pulse: 89 88  94  Resp: 19 18  18   Temp:   97.6 F (36.4 C)   TempSrc:   Oral   SpO2: 97% 95%  93%  Weight:      Height:        Intake/Output Summary (Last 24 hours) at 10/12/16 1250 Last data filed at 10/12/16 1021  Gross per 24 hour  Intake              531 ml  Output             1350 ml  Net             -819 ml   Filed Weights   10/11/16 0342  Weight: 106.6 kg (235 lb)    Telemetry    NSR - Personally Reviewed  Physical Exam  Comfortable, no distress GEN: No acute distress.   Neck: No JVD Cardiac: S1S2S4, RRR, no murmurs, rubs, or gallops.  Respiratory: Clear to auscultation bilaterally. GI: Soft, nontender, non-distended  MS: No edema; No deformity. Neuro:  Nonfocal  Psych: Normal affect   Labs    Chemistry Recent Labs Lab 10/11/16 0340 10/12/16 0801  NA 139 137  K 3.4* 4.4  CL 106 98*  CO2 24 27  GLUCOSE 142* 129*  BUN 18 19  CREATININE 1.08 1.02  CALCIUM 9.7 9.5  PROT 6.9  --   ALBUMIN 3.9  --   AST 29  --   ALT 27  --   ALKPHOS 103  --   BILITOT 1.2  --   GFRNONAA >60 >60  GFRAA >60 >60  ANIONGAP 9 12     Hematology Recent  Labs Lab 10/11/16 0340 10/12/16 0801  WBC 8.6 9.2  RBC 4.26 3.98*  HGB 12.8* 11.7*  HCT 37.8* 35.8*  MCV 88.7 89.9  MCH 30.0 29.4  MCHC 33.9 32.7  RDW 14.1 14.5  PLT 89* 94*    Cardiac Enzymes Recent Labs Lab 10/11/16 1203 10/11/16 1830 10/12/16 0000 10/12/16 0801  TROPONINI >65.00* >65.00* >65.00* >65.00*   No results for input(s): TROPIPOC in the last 168 hours.    Radiology    Nm Pet Image Initial (pi) Skull Base To Thigh  Result Date: 10/10/2016 CLINICAL DATA:  Initial treatment strategy for abdominal lymphadenopathy. EXAM: NUCLEAR MEDICINE PET SKULL BASE TO THIGH TECHNIQUE: 11.6 mCi F-18 FDG was injected intravenously. Full-ring PET imaging was performed from the skull base to thigh after the radiotracer. CT data was obtained and used for attenuation correction and anatomic localization. FASTING BLOOD GLUCOSE:  Value: 106 mg/dl COMPARISON:  CT 09/28/2016 FINDINGS: NECK No hypermetabolic lymph nodes in the neck. CHEST Hypermetabolic nodule along the distal esophagus with SUV max equal 6.4 just above the GE junction. No additional hypermetabolic mediastinal adenopathy. No suspicious pulmonary nodules. There are bilateral small pleural effusions. ABDOMEN/PELVIS There are 2 foci of metabolic activity within which appear to be within the wall of the stomach along the posterior aspect of the greater curvature. These discrete nodules are intense with SUV max equal 12.9. Discrete lesions are not evident on the CT portion exam. No clear lesion on comparison CT. Adjacent to the stomach within the gastrosplenic ligament there 2 rounded necrotic lymph nodes with SUV max equal 11.5 and measuring 2.2 cm There is hypermetabolic lymph node in the gastrohepatic ligament which is similar with SUV max equal 13.5 measuring 2.3 cm. There are enlarged hypermetabolic porta hepatis lymph nodes which are similar. Several upper abdominal retroperitoneal lymph nodes along the aorta and IVC intense metabolic  activity (SUV max equal 12.1). No hypermetabolic lymph nodes in the pelvis. SKELETON No focal hypermetabolic activity to suggest skeletal metastasis. IMPRESSION: 1. Hypermetabolic gastrohepatic ligament lymph nodes, gastrosplenic lymph nodes, porta hepatis lymph nodes and distal paraesophageal lymph nodes consistent with metastatic lymph nodes. 2. Two foci within the wall of the stomach along the posterior aspect of the greater curvature. 3. Differential for above findings include gastric carcinoma, esophageal carcinoma, gastrointestinal stromal tumor, or lymphoma. 4. Recommend upper endoscopy / EUS with sampling of the perigastric lymph nodes and potentially gastric wall lesions if identifiable. These results will be called to the ordering clinician or representative by the Radiologist Assistant, and communication documented in the PACS or zVision Dashboard. Electronically Signed   By: Suzy Bouchard M.D.   On: 10/10/2016 17:55   Dg Chest Port 1 View  Result Date: 10/11/2016 CLINICAL DATA:  Chest pain EXAM: PORTABLE CHEST 1 VIEW COMPARISON:  Chest CT 09/27/2016 FINDINGS: There is diffuse pulmonary edema. Cardiomediastinal silhouette is normal. No large pneumothorax or pleural effusion. IMPRESSION: Moderate pulmonary edema. Electronically Signed   By: Ulyses Jarred M.D.   On: 10/11/2016 04:09    Patient Profile     66 y.o. male with acute pulmonary embolism [id number], multiple embolic cerebral emboli [id number], anterior STEMI s/p LAD-DES [id number], complicated by pulmonary edema, acute LVEF25-35%, improved to 60-65% (despite marked elevation in troponin), hypertension, hyperlipidemia, gastroesophageal reflux, history of depression/bipolar disorder, and abdominal lymphadenopathy but so far without tissue diagnosis.  Assessment & Plan    1. Acute systolic HF:  Improved with diuretics and resolution of post MI stunning. The elevation in troponin was severe and sustained. Even accounting for LVH, it is  surprising that EF is now so good. Will review echo images. 2. CAD s/p anterior STEMI and LAD-DES: ideally will take uninterrupted DAPT for 12 months, but with Synergy stent could interrupt temporarily at 3 months. May need to consider a non-orthodox "bridge" with ticagrelor to allow early biopsy of gastric mass by EGD or abdominal lymph nodes, but would still wait at least 30 days. Discuss with interventional Cardiologist. 3. Recent DVT/PE: will also need enoxaparin interruption for biopsy. 4. Cryptogenic embolic strokes: small multifocal strokes suggest cardioembolic event. So far, AFib not documented. 5. Hypercoagulable state:  Hard decisions lie ahead regarding when the risk of temporarily stopping DAPT and anticoagulation versus delay in cancer tissue diagnosis has the optimal compromise. Appreciate the input from Heme-Onc specialists. Will switch Eliquis to enoxaparin 1mg /kg Q12h per Dr. Grier Mitts recommendations.  Signed, Sanda Klein, MD  10/12/2016,  12:50 PM

## 2016-10-12 NOTE — Progress Notes (Signed)
Pt just got to bed and would like to rest for a bit. We will f/u after lunch to ambulate. Gave pt and wife MI book and stent card to begin reading. Wife is concerned about pt not eating. Sts he is nauseated and does not have an appetite. He was eating oatmeal at home but now is not interested. Offered pt Ensure and he seemed to enjoy that with sitting up. I also put in a lunch order for cottage cheese and fruit plate. With discussion it sounds like pt will need a PT c/s.  [id number] Yves Dill CES, ACSM 11:13 AM 10/12/2016

## 2016-10-12 NOTE — Progress Notes (Signed)
CRITICAL VALUE ALERT  Critical value received: Troponin >65  Date of notification: 10/12/16  MD notified (1st page):  Yes Dr.Croitoru

## 2016-10-13 ENCOUNTER — Telehealth (HOSPITAL_COMMUNITY): Payer: Self-pay

## 2016-10-13 DIAGNOSIS — D6859 Other primary thrombophilia: Secondary | ICD-10-CM

## 2016-10-13 DIAGNOSIS — I824Y3 Acute embolism and thrombosis of unspecified deep veins of proximal lower extremity, bilateral: Secondary | ICD-10-CM

## 2016-10-13 DIAGNOSIS — I2699 Other pulmonary embolism without acute cor pulmonale: Secondary | ICD-10-CM

## 2016-10-13 LAB — TROPONIN I
TROPONIN I: 54.6 ng/mL — AB (ref <0.03)
TROPONIN I: 44.07 ng/mL — AB (ref <0.03)
Troponin I: 38.37 ng/mL (ref <0.03)

## 2016-10-13 LAB — CEA: CEA: 41.6 ng/mL — ABNORMAL HIGH (ref 0.0–4.7)

## 2016-10-13 LAB — LUPUS ANTICOAGULANT PANEL
DRVVT: 58.9 s — AB (ref 0.0–47.0)
PTT LA: 32.7 s (ref 0.0–51.9)

## 2016-10-13 LAB — DRVVT MIX: dRVVT Mix: 44.3 s (ref 0.0–47.0)

## 2016-10-13 LAB — AFP TUMOR MARKER: AFP TUMOR MARKER: 1.9 ng/mL (ref 0.0–8.3)

## 2016-10-13 MED ORDER — FUROSEMIDE 10 MG/ML IJ SOLN
40.0000 mg | Freq: Once | INTRAMUSCULAR | Status: AC
  Administered 2016-10-13: 40 mg via INTRAVENOUS
  Filled 2016-10-13: qty 4

## 2016-10-13 NOTE — Progress Notes (Signed)
CARDIAC REHAB PHASE I   PRE:  Rate/Rhythm: 85 SR  BP:  Supine: 102/73  Sitting:   Standing:    SaO2: 96% 2L  MODE:  Ambulation: 240 ft   POST:  Rate/Rhythm: 104 ST  BP:  Supine:   Sitting: 108/71  Standing:    SaO2: 95% 2L 1428-1452 Pt walked 240 ft on 2L with gait belt use, rolling walker and asst x 2. Stopped twice to rest. Pt tired by end of walk and had harder time processing what we wanted him to do. Could not hold to walker, back up to bed and sit where needed. Obtained order today for PT/OT as pt needs assistance with processing information and gait training.[name] Kitchen Has a hard time answering questions when walking. Stopped abruptly once but a better walk than yesterday. To bed after walk.   Graylon Good, RN BSN  10/13/2016 2:49 PM

## 2016-10-13 NOTE — Telephone Encounter (Signed)
Patient had a heart attack and in the ICU

## 2016-10-13 NOTE — Progress Notes (Signed)
Progress Note  Patient Name: [name] Date of Encounter: 10/13/2016  Primary Cardiologist: Tamala Julian  Subjective   He had some dyspnea and lightheadedness with ambulation yesterday and sitting in recliner. He is not eating much but ensure supplements. Had TTE yesterday demonstrating reduced LVEF 30-35%.  Inpatient Medications    Scheduled Meds: . aspirin  81 mg Oral Daily  . atorvastatin  80 mg Oral q1800  . carvedilol  3.125 mg Oral BID WC  . clopidogrel  75 mg Oral Q breakfast  . enoxaparin (LOVENOX) injection  1 mg/kg Subcutaneous Q12H  . escitalopram  20 mg Oral Daily  . lisinopril  2.5 mg Oral Daily  . mouth rinse  15 mL Mouth Rinse BID  . sodium chloride flush  3 mL Intravenous Q12H   Continuous Infusions:  PRN Meds: sodium chloride, acetaminophen, morphine injection, ondansetron (ZOFRAN) IV, oxyCODONE-acetaminophen, sodium chloride flush   Vital Signs    Vitals:   10/13/16 0700 10/13/16 0746 10/13/16 0800 10/13/16 0900  BP: 92/80  109/69 97/71  Pulse: 87  87 88  Resp: (!) 21  (!) 29 19  Temp:  99 F (37.2 C)    TempSrc:  Oral    SpO2: 92%  92% 97%  Weight:      Height:        Intake/Output Summary (Last 24 hours) at 10/13/16 0954 Last data filed at 10/13/16 0900  Gross per 24 hour  Intake              363 ml  Output              400 ml  Net              -37 ml   Filed Weights   10/11/16 0342  Weight: 235 lb (106.6 kg)    Telemetry    NSR - Personally Reviewed  Physical Exam  Comfortable, seen while eating breakfast this morning GEN: No acute distress.   Neck: No JVD Cardiac: S1S2S4, RRR, no murmurs, rubs, or gallops.  Respiratory: Very mild left basilar end inspiratory crackles, normal WOB GI: Soft, nontender, non-distended  MS: No edema; No deformity. Neuro:  Nonfocal  Psych: Normal affect   Labs    Chemistry  Recent Labs Lab 10/11/16 0340 10/12/16 0801  NA 139 137  K 3.4* 4.4  CL 106 98*  CO2 24 27  GLUCOSE 142* 129*  BUN  18 19  CREATININE 1.08 1.02  CALCIUM 9.7 9.5  PROT 6.9  --   ALBUMIN 3.9  --   AST 29  --   ALT 27  --   ALKPHOS 103  --   BILITOT 1.2  --   GFRNONAA >60 >60  GFRAA >60 >60  ANIONGAP 9 12     Hematology  Recent Labs Lab 10/11/16 0340 10/12/16 0801  WBC 8.6 9.2  RBC 4.26 3.98*  HGB 12.8* 11.7*  HCT 37.8* 35.8*  MCV 88.7 89.9  MCH 30.0 29.4  MCHC 33.9 32.7  RDW 14.1 14.5  PLT 89* 94*    Cardiac Enzymes  Recent Labs Lab 10/12/16 1522 10/12/16 1813 10/13/16 0122 10/13/16 0752  TROPONINI >65.00* >65.00* 54.60* 44.07*   No results for input(s): TROPIPOC in the last 168 hours.    Radiology    No results found.  Patient Profile     66 y.o. male with acute pulmonary embolism [id number], multiple embolic cerebral emboli [id number], anterior STEMI s/p LAD-DES Q000111Q, complicated by pulmonary edema, acute LVEF25-35%,  improved to 60-65% (despite marked elevation in troponin), hypertension, hyperlipidemia, gastroesophageal reflux, history of depression/bipolar disorder, and abdominal lymphadenopathy but so far without tissue diagnosis.  Assessment & Plan    1. Acute systolic HF: The elevation in troponin was severe and sustained. TTE shows some improvement compared to cath study but LVEF still reduced at 30-35% with associated anterior hypokinesis. This is consistent with his MI from mid LAD occlusion. He is tolerating low dose Coreg and lisinopril, although BP is soft with lightheadedness during exertion. He has very slight crackles and O2 sats in low 90s on 2L. 2. CAD s/p anterior STEMI and LAD-DES: ideally will take uninterrupted DAPT for 12 months, but with Synergy stent could interrupt temporarily at 3 months. May need to consider a non-orthodox "bridge" with ticagrelor to allow early biopsy of gastric mass by EGD or abdominal lymph nodes, but would still wait at least 30 days. Discuss with interventional Cardiologist. ASA/plavix/lipitor. 3. Recent DVT/PE: He will  also need enoxaparin interruption for biopsy. 4. Cryptogenic embolic strokes: small multifocal strokes suggest cardioembolic event. So far, AFib not documented. 5. Hypercoagulable state:  Hard decisions lie ahead regarding when the risk of temporarily stopping DAPT and anticoagulation versus delay in cancer tissue diagnosis has the optimal compromise. Now on enoxaparin anticoagulation for hypercoagulability of malignancy. Appreciate the input from Heme-Onc specialists.    Collier Salina, MD PGY-II Internal Medicine Resident Pager# [phone] 10/13/2016, 9:54 AM  I have seen and examined the patient along with Collier Salina, MD.  I have reviewed the chart, notes and new data.  I agree with his note.  Key new complaints: still dyspneic, but able to walk a medium distance with O2 Key examination changes: JVD present, S3 present, no rales Key new findings / data: echo shows EF 30-35%, elevated filling pressures.  PLAN: Diuretics IV today. BP precludes more CHF meds. Transfer stepdown.  Sanda Klein, MD, [place] [phone] 10/13/2016, 10:07 AM

## 2016-10-14 DIAGNOSIS — I5041 Acute combined systolic (congestive) and diastolic (congestive) heart failure: Secondary | ICD-10-CM

## 2016-10-14 LAB — BASIC METABOLIC PANEL
Anion gap: 8 (ref 5–15)
BUN: 23 mg/dL — AB (ref 6–20)
CHLORIDE: 96 mmol/L — AB (ref 101–111)
CO2: 29 mmol/L (ref 22–32)
CREATININE: 1.05 mg/dL (ref 0.61–1.24)
Calcium: 9.4 mg/dL (ref 8.9–10.3)
GFR calc Af Amer: >60 mL/min (ref >60)
GFR calc non Af Amer: >60 mL/min (ref >60)
GLUCOSE: 124 mg/dL — AB (ref 65–99)
Potassium: 4 mmol/L (ref 3.5–5.1)
SODIUM: 133 mmol/L — AB (ref 135–145)

## 2016-10-14 LAB — CBC
HEMATOCRIT: 31.6 % — AB (ref 39.0–52.0)
Hemoglobin: 10.2 g/dL — ABNORMAL LOW (ref 13.0–17.0)
MCH: 28.7 pg (ref 26.0–34.0)
MCHC: 32.3 g/dL (ref 30.0–36.0)
MCV: 89 fL (ref 78.0–100.0)
PLATELETS: 120 10*3/uL — AB (ref 150–400)
RBC: 3.55 MIL/uL — ABNORMAL LOW (ref 4.22–5.81)
RDW: 14 % (ref 11.5–15.5)
WBC: 7.8 10*3/uL (ref 4.0–10.5)

## 2016-10-14 LAB — CARDIOLIPIN ANTIBODIES, IGG, IGM, IGA
ANTICARDIOLIPIN IGM: 11 [MPL'U]/mL (ref 0–12)
Anticardiolipin IgA: 9 APL U/mL (ref 0–11)
Anticardiolipin IgG: <9 GPL U/mL (ref 0–14)

## 2016-10-14 LAB — BETA-2-GLYCOPROTEIN I ABS, IGG/M/A: Beta-2-Glycoprotein I IgM: <9 GPI IgM units (ref 0–32)

## 2016-10-14 MED ORDER — MAGNESIUM HYDROXIDE 400 MG/5ML PO SUSP
30.0000 mL | Freq: Once | ORAL | Status: AC
  Administered 2016-10-14: 30 mL via ORAL
  Filled 2016-10-14: qty 30

## 2016-10-14 MED ORDER — FUROSEMIDE 10 MG/ML IJ SOLN
40.0000 mg | Freq: Two times a day (BID) | INTRAMUSCULAR | Status: DC
  Administered 2016-10-14 – 2016-10-15 (×3): 40 mg via INTRAVENOUS
  Filled 2016-10-14 (×3): qty 4

## 2016-10-14 NOTE — Progress Notes (Signed)
CARDIAC REHAB PHASE I   PRE:  Rate/Rhythm: 82 SR  BP:  Sitting: 98/66        SaO2: 95 RA  MODE:  Ambulation: 480 ft   POST:  Rate/Rhythm: 100 SR  BP:  Sitting: 115/75         SaO2: 99 RA  Pt ambulated 480 ft on RA, rolling walker, assist x2 for safety, steady gait, tolerated well. Pt denies any complaints, declined rest stop, no abrupt stops. Pt did seem tired towards end of walk, still slow to process information, had less difficulty backing into recliner today, did better overall. Pt to recliner after walk, feet elevated, call bell within reach. Will follow. Can be x1.   [id number] Lenna Sciara, RN, BSN 10/14/2016 1:56 PM

## 2016-10-14 NOTE — Progress Notes (Signed)
Progress Note  Patient Name: [name] Date of Encounter: 10/14/2016  Primary Cardiologist: Katrinka Blazing (new)  Subjective   No angina. Still has some orthopnea. Stopped twice walking 240 ft with PT. Probably due to his stroke, he needs a longer time to answer questions. He is fully oriented, but has difficulty formulating answers. No true aphasia or dysarthria.  Inpatient Medications    Scheduled Meds: . aspirin  81 mg Oral Daily  . atorvastatin  80 mg Oral q1800  . carvedilol  3.125 mg Oral BID WC  . clopidogrel  75 mg Oral Q breakfast  . enoxaparin (LOVENOX) injection  1 mg/kg Subcutaneous Q12H  . escitalopram  20 mg Oral Daily  . furosemide  40 mg Intravenous Q12H  . lisinopril  2.5 mg Oral Daily  . mouth rinse  15 mL Mouth Rinse BID   Continuous Infusions:  PRN Meds: sodium chloride, acetaminophen, oxyCODONE-acetaminophen   Vital Signs    Vitals:   10/14/16 0200 10/14/16 0300 10/14/16 0400 10/14/16 0700  BP: 96/66 92/70 94/70  122/83  Pulse: 83 88 75 79  Resp: (!) 24 (!) 21 (!) 26 20  Temp:  98.1 F (36.7 C)  98 F (36.7 C)  TempSrc:  Oral  Oral  SpO2: 92% 94% 95% 96%  Weight:      Height:        Intake/Output Summary (Last 24 hours) at 10/14/16 0902 Last data filed at 10/13/16 1700  Gross per 24 hour  Intake              300 ml  Output              900 ml  Net             -600 ml   Filed Weights   10/11/16 0342  Weight: 106.6 kg (235 lb)    Telemetry    NSR - Personally Reviewed  ECG    NSR with "frozen infarct"pattern , convex ST elevation across the precordium. - Personally Reviewed  Physical Exam  Alert, comfortable whensitting up GEN: No acute distress.   Neck: 5-6 cm JVD Cardiac: RRR, no murmurs, rubs, or gallops.  Respiratory: Clear to auscultation bilaterally. GI: Soft, nontender, non-distended  MS: No edema; No deformity. Neuro:  Nonfocal  Psych: Normal affect   Labs    Chemistry Recent Labs Lab 10/11/16 0340  10/12/16 0801 10/14/16 0210  NA 139 137 133*  K 3.4* 4.4 4.0  CL 106 98* 96*  CO2 24 27 29   GLUCOSE 142* 129* 124*  BUN 18 19 23*  CREATININE 1.08 1.02 1.05  CALCIUM 9.7 9.5 9.4  PROT 6.9  --   --   ALBUMIN 3.9  --   --   AST 29  --   --   ALT 27  --   --   ALKPHOS 103  --   --   BILITOT 1.2  --   --   GFRNONAA >60 >60 >60  GFRAA >60 >60 >60  ANIONGAP 9 12 8      Hematology Recent Labs Lab 10/11/16 0340 10/12/16 0801 10/14/16 0210  WBC 8.6 9.2 7.8  RBC 4.26 3.98* 3.55*  HGB 12.8* 11.7* 10.2*  HCT 37.8* 35.8* 31.6*  MCV 88.7 89.9 89.0  MCH 30.0 29.4 28.7  MCHC 33.9 32.7 32.3  RDW 14.1 14.5 14.0  PLT 89* 94* 120*    Cardiac Enzymes Recent Labs Lab 10/12/16 1813 10/13/16 0122 10/13/16 0752 10/13/16 1457  TROPONINI >65.00*  54.60* 44.07* 38.37*   No results for input(s): TROPIPOC in the last 168 hours.   BNPNo results for input(s): BNP, PROBNP in the last 168 hours.   DDimer No results for input(s): DDIMER in the last 168 hours.   Radiology    No results found.  Cardiac Studies   ECHO 10/12/16  - Left ventricle: The cavity size was normal. Systolic function was   moderately to severely reduced. The estimated ejection fraction   was in the range of 30% to 35%. Severe hypokinesis of the   apicalanteroseptal, anterior, anterolateral, inferior, and apical   myocardium; consistent with infarction in the distribution of the   left anterior descending coronary artery. Features are consistent   with a pseudonormal left ventricular filling pattern, with   concomitant abnormal relaxation and increased filling pressure   (grade 2 diastolic dysfunction). Acoustic contrast opacification   revealed no evidence ofthrombus. - Aortic valve: There was mild to moderate regurgitation. - Mitral valve: Calcified annulus. There was mild to moderate   regurgitation directed eccentrically and posteriorly.  CATH 10/11/16  Diagnostic Diagram     Post-Intervention Diagram      Implants     Permanent Stent  Stent Synergy Des 3x16 - [id number] - Implanted    Inventory item: Stent Synergy Des 3x16 Model/Cat number: [id number]  Manufacturer: [employer] Lot number: [id number]  Device identifier: [id number] Device identifier type: GS1  Area Of Implantation: Mid LAD    GUDID Information   Request status Successful    Brand name: SYNERGYT Version/Model: [id number]  Company name: [employer] MRI safety info as of 10/11/16: MR Conditional  Contains dry or latex rubber: No    GMDN P.T. name: Coronary angioplasty balloon catheter, basic          Patient Profile     66 y.o. male  with multiple thrombotic complications in rapid succession: acute pulmonary embolism 09/10/2016, multiple embolic cerebral emboli 09/25/2016, anterior STEMI s/p LAD-DES 10/11/2016, complicated by pulmonary edema, acute LVEF25-35%, improved to 30-35% (prolonged and marked elevation in troponin), hypertension, hyperlipidemia, gastroesophageal reflux, history of depression/bipolar disorder. 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   Assessment & Plan    1. Acute systolic HF: The elevation in troponin was severe and sustained. TTE shows some improvement compared to cath study, but LVEF still reduced at 30-35% with associated anterior hypokinesis. This is consistent with his MI from mid LAD occlusion. ECG is concerning for aneurysm formation, risk of mechanical complications. He is tolerating low dose Coreg and lisinopril, although BP is soft. Needs more diuresis. Consider LifeVest (not called yet). 2. CAD s/p anterior STEMI and LAD-DES: ideally will take uninterrupted DAPT for 12 months, but with Synergy stent could interrupt temporarily at 3 months. May need to consider a non-orthodox "bridge" with cangrelor to allow early biopsy of gastric mass by EGD or  abdominal lymph nodes, but would still wait at least 30 days. Discuss with interventional Cardiologist at follow up. ASA/plavix/lipitor. 3. Recent DVT/PE: He will also need enoxaparin interruption for biopsy. 4. Cryptogenic embolic strokes: small multifocal strokes suggest cardioembolic event. So far, AFib not documented. 5. Hypercoagulable state:  Hard decisions lie ahead regarding when the risk of temporarily stopping DAPT and anticoagulation versus delay in cancer tissue diagnosis has the optimal compromise. Now on enoxaparin anticoagulation for hypercoagulability of malignancy. Appreciate the input from Heme-Onc specialists.    Signed, Thurmon Fair, MD  10/14/2016, 9:02 AM

## 2016-10-14 NOTE — Evaluation (Signed)
Occupational Therapy Evaluation Patient Details Name: [name] MRN: [id number] DOB: 19-Dec-1950 Today's Date: 10/14/2016    History of Present Illness pt presents post STEMI with recent PE on 1/20 and CVA on 2/4.  pt with PMH of HTN, Depression, Bipolar, CHF, and Abdominal Lymphadenopathy.     Clinical Impression   Pt was independent in self care prior to admission. Currently functioning at a supervision level for safety. Pt with impaired processing speed, likely to do better in his own environment. Recommending OPOT to address executive functions and IADL.    Follow Up Recommendations  Outpatient OT;Supervision/Assistance - 24 hour    Equipment Recommendations  None recommended by OT (wife to purchase toilet riser and shower seat online)    Recommendations for Other Services       Precautions / Restrictions Precautions Precautions: Fall Restrictions Weight Bearing Restrictions: No      Mobility Bed Mobility                 Transfers Overall transfer level: Needs assistance Equipment used: None Transfers: Sit to/from Stand Sit to Stand: Supervision         General transfer comment: S for safety only.    Balance Overall balance assessment: Needs assistance Sitting-balance support: Feet supported;No upper extremity supported Sitting balance-Leahy Scale: Good     Standing balance support: No upper extremity supported;Bilateral upper extremity supported;Single extremity supported;During functional activity Standing balance-Leahy Scale: Fair                              ADL Overall ADL's : Needs assistance/impaired Eating/Feeding: Independent;Sitting   Grooming: Wash/dry hands;Standing;Supervision/safety Grooming Details (indicate cue type and reason): some difficulty with sequencing Upper Body Bathing: Supervision/ safety;Standing   Lower Body Bathing: Supervison/ safety;Sit to/from stand   Upper Body Dressing : Set up;Sitting    Lower Body Dressing: Supervision/safety;Sit to/from stand   Toilet Transfer: Supervision/safety;Ambulation;Comfort height toilet   Toileting- Clothing Manipulation and Hygiene: Supervision/safety;Sit to/from stand       Functional mobility during ADLs: Supervision/safety       Vision Baseline Vision/History: Wears glasses Wears Glasses: At all times Patient Visual Report: No change from baseline Vision Assessment?: No apparent visual deficits     Perception     Praxis      Pertinent Vitals/Pain Pain Assessment: No/denies pain     Hand Dominance Right   Extremity/Trunk Assessment Upper Extremity Assessment Upper Extremity Assessment: Overall WFL for tasks assessed   Lower Extremity Assessment Lower Extremity Assessment: Defer to PT evaluation   Cervical / Trunk Assessment Cervical / Trunk Assessment: Normal   Communication Communication Communication: Expressive difficulties   Cognition Arousal/Alertness: Awake/alert Behavior During Therapy: WFL for tasks assessed/performed Overall Cognitive Status: History of cognitive impairments - at baseline                 General Comments: since CVA pt has had memory deficits, difficulties with multi tasking, decreased attention, and difficulty with multi step directions.  Per wife this has been improving.     General Comments       Exercises       Shoulder Instructions      Home Living Family/patient expects to be discharged to:: Private residence Living Arrangements: Spouse/significant other Available Help at Discharge: Family;Available 24 hours/day Type of Home: House Home Access: Level entry (from garage)     Home Layout: Able to live on main level with bedroom/bathroom;Two level  Bathroom Shower/Tub: Occupational psychologist: Standard     Home Equipment: None;Grab bars - tub/shower      Lives With: Spouse    Prior Functioning/Environment Level of Independence: Needs assistance   Gait / Transfers Assistance Needed: Ambulates without AD, but needs S outside and on uneven surfaces.   ADL's / Homemaking Assistance Needed: Performs his own ADLs and assists with homemaking tasks.  Wife pays bills and cooks.   Communication / Swallowing Assistance Needed: Slow, question word finding deficits          OT Problem List: Decreased cognition;Impaired balance (sitting and/or standing)      OT Treatment/Interventions:      OT Goals(Current goals can be found in the care plan section) Acute Rehab OT Goals Patient Stated Goal: To get better  OT Frequency:     Barriers to D/C:            Co-evaluation              End of Session Equipment Utilized During Treatment: Gait belt Nurse Communication:  (pt wants a laxative)  Activity Tolerance: Patient tolerated treatment well Patient left: in chair;with call bell/phone within reach;with family/visitor present  OT Visit Diagnosis: Unsteadiness on feet (R26.81);Cognitive communication deficit (R41.841);Other symptoms and signs involving cognitive function Symptoms and signs involving cognitive functions: Cerebral infarction                ADL either performed or assessed with clinical judgement  Time: 1036-1119 OT Time Calculation (min): 43 min Charges:  OT General Charges $OT Visit: 1 Procedure OT Evaluation $OT Eval Moderate Complexity: 1 Procedure OT Treatments $Self Care/Home Management : 8-22 mins G-Codes:      Malka So 10/14/2016, 2:50 PM  [phone]

## 2016-10-14 NOTE — Evaluation (Signed)
Physical Therapy Evaluation Patient Details Name: [name] MRN: [id number] DOB: 03/19/1951 Today's Date: 10/14/2016   History of Present Illness  pt presents post STEMI with recent PE on 1/20 and CVA on 2/4.  pt with PMH of HTN, Depression, Bipolar, CHF, and Abdominal Lymphadenopathy.    Clinical Impression  Pt very pleasant and agreeable to mobility.  Pt able to ambulate with RW and S.  Pt does have cognitive deficits since his CVA on 2/4, but per pt and wife cognition has been slowly improving.  Pt was to start outpatient PT, OT, and SLP this coming Monday, however wife indicates they cancelled the appointment due to pt being admitted.  Pt would benefit from script for outpatient therapies to continue recovery after CVA.  Will continue to follow while on acute.      Follow Up Recommendations Outpatient PT (and outpatient SLP)    Equipment Recommendations  Rolling walker with 5" wheels;3in1 (PT)    Recommendations for Other Services       Precautions / Restrictions Precautions Precautions: Fall Restrictions Weight Bearing Restrictions: No      Mobility  Bed Mobility Overal bed mobility: Modified Independent                Transfers Overall transfer level: Needs assistance Equipment used: Rolling walker (2 wheeled) Transfers: Sit to/from Stand Sit to Stand: Supervision         General transfer comment: S for safety only.  Ambulation/Gait Ambulation/Gait assistance: Supervision Ambulation Distance (Feet): 500 Feet Assistive device: Rolling walker (2 wheeled) Gait Pattern/deviations: Step-through pattern;Decreased stride length;Drifts right/left     General Gait Details: pt moving well with RW when allowed to focus on task, however if PT engages pt in conversation pt tends to drift and at times has to stop ambulating in order to converse with PT.    Stairs            Wheelchair Mobility    Modified Rankin (Stroke Patients Only) Modified Rankin  (Stroke Patients Only) Pre-Morbid Rankin Score: No symptoms Modified Rankin: Moderately severe disability     Balance Overall balance assessment: Needs assistance Sitting-balance support: Feet supported;No upper extremity supported Sitting balance-Leahy Scale: Good     Standing balance support: No upper extremity supported;Bilateral upper extremity supported;Single extremity supported;During functional activity Standing balance-Leahy Scale: Fair                               Pertinent Vitals/Pain Pain Assessment: No/denies pain    Home Living Family/patient expects to be discharged to:: Private residence Living Arrangements: Spouse/significant other Available Help at Discharge: Family;Available 24 hours/day Type of Home: House Home Access: Level entry (from garage)     Home Layout: Able to live on main level with bedroom/bathroom;Two level Home Equipment: None;Grab bars - tub/shower      Prior Function Level of Independence: Needs assistance   Gait / Transfers Assistance Needed: Ambulates without AD, but needs S outside and on uneven surfaces.    ADL's / Homemaking Assistance Needed: Performs his own ADLs and assists with homemaking tasks.  Wife pays bills and cooks.          Hand Dominance   Dominant Hand: Right    Extremity/Trunk Assessment   Upper Extremity Assessment Upper Extremity Assessment: Defer to OT evaluation    Lower Extremity Assessment Lower Extremity Assessment: Generalized weakness    Cervical / Trunk Assessment Cervical / Trunk Assessment: Normal  Communication   Communication: Expressive difficulties  Cognition Arousal/Alertness: Awake/alert Behavior During Therapy: WFL for tasks assessed/performed Overall Cognitive Status: History of cognitive impairments - at baseline                 General Comments: since CVA pt has had memory deficits, difficulties with multi tasking, decreased attention, and difficulty with  multi step directions.  Per wife this has been improving.      General Comments      Exercises     Assessment/Plan    PT Assessment Patient needs continued PT services  PT Problem List Decreased strength;Decreased activity tolerance;Decreased balance;Decreased mobility;Decreased coordination;Decreased cognition;Decreased knowledge of use of DME;Decreased safety awareness       PT Treatment Interventions DME instruction;Gait training;Stair training;Functional mobility training;Therapeutic activities;Therapeutic exercise;Balance training;Patient/family education;Neuromuscular re-education;Cognitive remediation    PT Goals (Current goals can be found in the Care Plan section)  Acute Rehab PT Goals Patient Stated Goal: To get better PT Goal Formulation: With patient Time For Goal Achievement: 10/21/16 Potential to Achieve Goals: Good    Frequency Min 3X/week   Barriers to discharge        Co-evaluation               End of Session Equipment Utilized During Treatment: Gait belt Activity Tolerance: Patient tolerated treatment well Patient left: in chair;with call bell/phone within reach;with chair alarm set;with family/visitor present Nurse Communication: Mobility status PT Visit Diagnosis: Unsteadiness on feet (R26.81)         Time: [id number] PT Time Calculation (min) (ACUTE ONLY): 56 min   Charges:   PT Evaluation $PT Eval Moderate Complexity: 1 Procedure PT Treatments $Gait Training: 23-37 mins $Therapeutic Activity: 8-22 mins   PT G CodesCatarina Hartshorn, PT  [phone] 10/14/2016, 2:26 PM

## 2016-10-14 NOTE — Care Management Note (Signed)
Case Management Note  Patient Details  Name: [name] MRN: [id number] Date of Birth: 06/26/1951  Subjective/Objective:   Adm w stemi                 Action/Plan: lives w wife, plan to return home   Expected Discharge Date:                  Expected Discharge Plan:  [place]  In-House Referral:     Discharge planning Services  CM Consult  Post Acute Care Choice:    Choice offered to:     DME Arranged:    DME Agency:     HH Arranged:    HH Agency:     Status of Service:  In process, will continue to follow  If discussed at Long Length of Stay Meetings, dates discussed:    Additional Comments: md note states may need lifevest. Placed lifevest order form on shadow chart.  Lacretia Leigh, RN 10/14/2016, 10:29 AM

## 2016-10-15 ENCOUNTER — Other Ambulatory Visit: Payer: Self-pay

## 2016-10-15 DIAGNOSIS — I5021 Acute systolic (congestive) heart failure: Secondary | ICD-10-CM

## 2016-10-15 LAB — GLUCOSE, CAPILLARY: Glucose-Capillary: 129 mg/dL — ABNORMAL HIGH (ref 65–99)

## 2016-10-15 LAB — CANCER ANTIGEN 19-9: CA 19-9: 1990 U/mL — ABNORMAL HIGH (ref 0–35)

## 2016-10-15 MED ORDER — FUROSEMIDE 40 MG PO TABS
40.0000 mg | ORAL_TABLET | Freq: Every day | ORAL | Status: DC
  Administered 2016-10-16 – 2016-10-17 (×2): 40 mg via ORAL
  Filled 2016-10-15 (×2): qty 1

## 2016-10-15 MED ORDER — SPIRONOLACTONE 25 MG PO TABS
12.5000 mg | ORAL_TABLET | Freq: Every day | ORAL | Status: DC
Start: 2016-10-15 — End: 2016-10-17
  Administered 2016-10-15 – 2016-10-17 (×3): 12.5 mg via ORAL
  Filled 2016-10-15 (×3): qty 1

## 2016-10-15 MED ORDER — ONDANSETRON HCL 4 MG/2ML IJ SOLN
4.0000 mg | Freq: Four times a day (QID) | INTRAMUSCULAR | Status: DC | PRN
  Administered 2016-10-15: 4 mg via INTRAVENOUS
  Filled 2016-10-15: qty 2

## 2016-10-15 NOTE — Progress Notes (Signed)
CARDIAC REHAB PHASE I   PRE:  Rate/Rhythm: 86  BP:  Sitting:      SaO2: 93ra  MODE:  Ambulation: 500 ft   POST:  Rate/Rhythm: 91  BP:  Sitting: 117/67     SaO2: 97ra  10:35-11:16am Patient ambulated independently with a rolling walker. No rest breaks. No complaints. Stated it felt good to get up and move around. Placed in chair with call bell in reach. Requesting a bath-nurse aware. Since patient struggles with processing information, will finish education when wife arrives. Gave nurse my contact information and I will come back to educate once she arrives.   [place], [place] 10/15/2016 11:13 AM

## 2016-10-15 NOTE — Progress Notes (Signed)
CARDIAC REHAB PHASE I   Met with patient and his wife to discuss education. We discussed Cardiac Rehab, nutrition, exercising at home, NTG use, and restrictions. Patient is going to attend [facility] cardiac Rehab. Order placed. Patient in bed with wife at side.   [place], MS 10/15/2016 1:20 PM

## 2016-10-15 NOTE — Progress Notes (Signed)
Progress Note  Patient Name: [name] Date of Encounter: 10/15/2016  Primary Cardiologist: Tamala Julian (new)  Subjective   Walking halls with CR. Feels good. No CP or SOB. I/Os not recorded. No weights on chart. BP soft. Remains on lasix 40 iv BID   Speech improving.   Inpatient Medications    Scheduled Meds:  aspirin  81 mg Oral Daily   atorvastatin  80 mg Oral q1800   carvedilol  3.125 mg Oral BID WC   clopidogrel  75 mg Oral Q breakfast   enoxaparin (LOVENOX) injection  1 mg/kg Subcutaneous Q12H   escitalopram  20 mg Oral Daily   furosemide  40 mg Intravenous Q12H   lisinopril  2.5 mg Oral Daily   mouth rinse  15 mL Mouth Rinse BID   Continuous Infusions:  PRN Meds: sodium chloride, acetaminophen, oxyCODONE-acetaminophen   Vital Signs    Vitals:   10/15/16 0748 10/15/16 0800 10/15/16 0855 10/15/16 0907  BP: 99/60     Pulse: 83 82  85  Resp: (!) 22 20    Temp: 99.1 F (37.3 C)     TempSrc: Oral     SpO2:   91%   Weight:      Height:        Intake/Output Summary (Last 24 hours) at 10/15/16 1100 Last data filed at 10/14/16 2000  Gross per 24 hour  Intake              300 ml  Output              225 ml  Net               75 ml   Filed Weights   10/11/16 0342  Weight: 106.6 kg (235 lb)    Telemetry    NSR 80s  - Personally Reviewed  ECG    NSR 81 with iRRRB and persistent with convex ST elevation across the precordium. - Personally Reviewed  Physical Exam   Walking halls with walker GEN: No acute distress.   Neck: 5-6 cm JVP Cardiac: RRR, no murmurs, rubs, or gallops.  Respiratory: Clear to auscultation bilaterally. GI: Soft, nontender, non-distended good BS MS: No edema; No deformity. Neuro: Mild trouble with speech latency. Otherwise stable Psych: Normal affect   Labs    Chemistry  Recent Labs Lab 10/11/16 0340 10/12/16 0801 10/14/16 0210  NA 139 137 133*  K 3.4* 4.4 4.0  CL 106 98* 96*  CO2 24 27 29   GLUCOSE 142*  129* 124*  BUN 18 19 23*  CREATININE 1.08 1.02 1.05  CALCIUM 9.7 9.5 9.4  PROT 6.9  --   --   ALBUMIN 3.9  --   --   AST 29  --   --   ALT 27  --   --   ALKPHOS 103  --   --   BILITOT 1.2  --   --   GFRNONAA >60 >60 >60  GFRAA >60 >60 >60  ANIONGAP 9 12 8      Hematology  Recent Labs Lab 10/11/16 0340 10/12/16 0801 10/14/16 0210  WBC 8.6 9.2 7.8  RBC 4.26 3.98* 3.55*  HGB 12.8* 11.7* 10.2*  HCT 37.8* 35.8* 31.6*  MCV 88.7 89.9 89.0  MCH 30.0 29.4 28.7  MCHC 33.9 32.7 32.3  RDW 14.1 14.5 14.0  PLT 89* 94* 120*    Cardiac Enzymes  Recent Labs Lab 10/12/16 1813 10/13/16 0122 10/13/16 0752 10/13/16 1457  TROPONINI >65.00*  54.60* 44.07* 38.37*   No results for input(s): TROPIPOC in the last 168 hours.   BNPNo results for input(s): BNP, PROBNP in the last 168 hours.   DDimer No results for input(s): DDIMER in the last 168 hours.   Radiology    No results found.  Cardiac Studies   ECHO 10/12/16  - Left ventricle: The cavity size was normal. Systolic function was   moderately to severely reduced. The estimated ejection fraction   was in the range of 30% to 35%. Severe hypokinesis of the   apicalanteroseptal, anterior, anterolateral, inferior, and apical   myocardium; consistent with infarction in the distribution of the   left anterior descending coronary artery. Features are consistent   with a pseudonormal left ventricular filling pattern, with   concomitant abnormal relaxation and increased filling pressure   (grade 2 diastolic dysfunction). Acoustic contrast opacification   revealed no evidence ofthrombus. - Aortic valve: There was mild to moderate regurgitation. - Mitral valve: Calcified annulus. There was mild to moderate   regurgitation directed eccentrically and posteriorly.  CATH 10/11/16  Diagnostic Diagram     Post-Intervention Diagram     Implants     Permanent Stent  Stent Synergy Des 3x16 - [id number] - Implanted    Inventory  item: Stent Synergy Des 3x16 Model/Cat number: [id number]  Manufacturer: [employer] Lot number: [id number]  Device identifier: [id number] Device identifier type: GS1  Area Of Implantation: Mid LAD    GUDID Information   Request status Successful    Brand name: SYNERGY Version/Model: [id number]  Company name: [employer] MRI safety info as of 10/11/16: MR Conditional  Contains dry or latex rubber: No    GMDN P.T. name: Coronary angioplasty balloon catheter, basic          Patient Profile     66 y.o. male  with multiple thrombotic complications in rapid succession: acute pulmonary embolism [id number], multiple embolic cerebral emboli [id number], anterior STEMI s/p LAD-DES [id number], complicated by pulmonary edema, acute LVEF25-35%, improved to 30-35% (prolonged and marked elevation in troponin), hypertension, hyperlipidemia, gastroesophageal reflux, history of depression/bipolar disorder. 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   Assessment & Plan    1. Acute systolic HF in setting of acute anterior MI:  --The elevation in troponin was severe and sustained. TTE shows some improvement compared to cath study, but LVEF still reduced at 30-35% with associated anterior hypokinesis.  --This is consistent with his MI from mid LAD occlusion. ECG is concerning for aneurysm formation, risk of mechanical complications.  --He is tolerating low dose Coreg and lisinopril, although BP is soft. --Volume status much improved. Switch to po lasix. Start low-dose spiro -- Consider LifeVest (not called yet). 2. CAD s/p anterior STEMI and LAD-DES:  --s/p PCI of LAD. No further angian --ideally will take uninterrupted DAPT for 12 months, but with Synergy stent could interrupt temporarily at 3 months.  -- Agree with Dr. Loletha Grayer. May need to consider a non-orthodox "bridge" with  cangrelor to allow early biopsy of gastric mass by EGD or abdominal lymph nodes, but would still wait at least 30 days. Discuss with interventional Cardiologist at follow up. ASA/plavix/lipitor. --Continue cardiac rehab 3. Recent DVT/PE:  --Continue enoxaparin.He will also need enoxaparin interruption for biopsy. 4. Cryptogenic embolic strokes:  --small multifocal strokes suggest cardioembolic event. So far, AFib not documented. --on enoxaparin 5. Hypercoagulable state:  Hard decisions lie ahead regarding when the risk of  temporarily stopping DAPT and anticoagulation versus delay in cancer tissue diagnosis has the optimal compromise. Now on enoxaparin anticoagulation for hypercoagulability of malignancy. Appreciate the input from Heme-Onc specialists. 6. Gastric mass --as per above   Signed, Glori Bickers, MD  10/15/2016, 11:00 AM

## 2016-10-15 NOTE — Progress Notes (Signed)
Patients 02 sat 84% on room air while sleeping, when nurse entered the room patient snoring with long pauses that might be sleep apnea. 02 applied at 2L via nasal cannula. Sats immediately returned to 96%. Will continue to monitor.

## 2016-10-16 DIAGNOSIS — C762 Malignant neoplasm of abdomen: Secondary | ICD-10-CM

## 2016-10-16 LAB — COMPREHENSIVE METABOLIC PANEL
ALBUMIN: 2.9 g/dL — AB (ref 3.5–5.0)
ALT: 32 U/L (ref 17–63)
AST: 35 U/L (ref 15–41)
Alkaline Phosphatase: 76 U/L (ref 38–126)
Anion gap: 10 (ref 5–15)
BILIRUBIN TOTAL: 1.4 mg/dL — AB (ref 0.3–1.2)
BUN: 22 mg/dL — AB (ref 6–20)
CO2: 28 mmol/L (ref 22–32)
CREATININE: 0.98 mg/dL (ref 0.61–1.24)
Calcium: 9.1 mg/dL (ref 8.9–10.3)
Chloride: 94 mmol/L — ABNORMAL LOW (ref 101–111)
GFR calc Af Amer: >60 mL/min (ref >60)
GLUCOSE: 118 mg/dL — AB (ref 65–99)
Potassium: 3.6 mmol/L (ref 3.5–5.1)
Sodium: 132 mmol/L — ABNORMAL LOW (ref 135–145)
TOTAL PROTEIN: 5.5 g/dL — AB (ref 6.5–8.1)

## 2016-10-16 LAB — CBC
HEMATOCRIT: 29.4 % — AB (ref 39.0–52.0)
HEMOGLOBIN: 9.7 g/dL — AB (ref 13.0–17.0)
MCH: 28.9 pg (ref 26.0–34.0)
MCHC: 33 g/dL (ref 30.0–36.0)
MCV: 87.5 fL (ref 78.0–100.0)
Platelets: 165 10*3/uL (ref 150–400)
RBC: 3.36 MIL/uL — AB (ref 4.22–5.81)
RDW: 13.7 % (ref 11.5–15.5)
WBC: 6.8 10*3/uL (ref 4.0–10.5)

## 2016-10-16 MED ORDER — LOSARTAN POTASSIUM 25 MG PO TABS
12.5000 mg | ORAL_TABLET | Freq: Two times a day (BID) | ORAL | Status: DC
  Administered 2016-10-16 – 2016-10-17 (×2): 12.5 mg via ORAL
  Filled 2016-10-16 (×2): qty 1

## 2016-10-16 NOTE — Progress Notes (Addendum)
Progress Note  Patient Name: [name] Date of Encounter: 10/16/2016  Primary Cardiologist: Tamala Julian (new)  Subjective   Last night noted to have witnessed sleep apnea with sats into mid 80s.   This am feels good. Walking halls.No CP or SOB. No weights on chart.   Inpatient Medications    Scheduled Meds:  aspirin  81 mg Oral Daily   atorvastatin  80 mg Oral q1800   carvedilol  3.125 mg Oral BID WC   clopidogrel  75 mg Oral Q breakfast   enoxaparin (LOVENOX) injection  1 mg/kg Subcutaneous Q12H   escitalopram  20 mg Oral Daily   furosemide  40 mg Oral Daily   lisinopril  2.5 mg Oral Daily   mouth rinse  15 mL Mouth Rinse BID   spironolactone  12.5 mg Oral Daily   Continuous Infusions:  PRN Meds: sodium chloride, acetaminophen, ondansetron (ZOFRAN) IV, oxyCODONE-acetaminophen   Vital Signs    Vitals:   10/16/16 0416 10/16/16 0824 10/16/16 1003 10/16/16 1208  BP: 105/72 108/73  120/77  Pulse: 77   79  Resp: (!) 25   (!) 22  Temp: 98.1 F (36.7 C) 98.1 F (36.7 C)  98.8 F (37.1 C)  TempSrc: Oral Oral  Oral  SpO2: 97%  (!) 81% 92%  Weight:      Height:        Intake/Output Summary (Last 24 hours) at 10/16/16 1244 Last data filed at 10/16/16 0000  Gross per 24 hour  Intake              720 ml  Output              300 ml  Net              420 ml   Filed Weights   10/11/16 0342  Weight: 106.6 kg (235 lb)    Telemetry    NSR 80s no VT - Personally Reviewed  ECG    NSR 81 with iRRRB and persistent with convex ST elevation across the precordium. - Personally Reviewed  Physical Exam   Walking halls with walker GEN: No acute distress.   Neck: 5-6 cm JVP Cardiac: RRR, no murmurs, rubs, or gallops.  Respiratory: Clear to auscultation bilaterally. GI: Soft, nontender, non-distended good BS MS: No edema; No deformity. Neuro: Mild trouble with speech latency. Otherwise stable Psych: Normal affect   Labs    Chemistry  Recent Labs Lab  10/11/16 0340 10/12/16 0801 10/14/16 0210 10/15/16 2351  NA 139 137 133* 132*  K 3.4* 4.4 4.0 3.6  CL 106 98* 96* 94*  CO2 24 27 29 28   GLUCOSE 142* 129* 124* 118*  BUN 18 19 23* 22*  CREATININE 1.08 1.02 1.05 0.98  CALCIUM 9.7 9.5 9.4 9.1  PROT 6.9  --   --  5.5*  ALBUMIN 3.9  --   --  2.9*  AST 29  --   --  35  ALT 27  --   --  32  ALKPHOS 103  --   --  76  BILITOT 1.2  --   --  1.4*  GFRNONAA >60 >60 >60 >60  GFRAA >60 >60 >60 >60  ANIONGAP 9 12 8 10      Hematology  Recent Labs Lab 10/12/16 0801 10/14/16 0210 10/15/16 2351  WBC 9.2 7.8 6.8  RBC 3.98* 3.55* 3.36*  HGB 11.7* 10.2* 9.7*  HCT 35.8* 31.6* 29.4*  MCV 89.9 89.0 87.5  MCH 29.4  28.7 28.9  MCHC 32.7 32.3 33.0  RDW 14.5 14.0 13.7  PLT 94* 120* 165    Cardiac Enzymes  Recent Labs Lab 10/12/16 1813 10/13/16 0122 10/13/16 0752 10/13/16 1457  TROPONINI >65.00* 54.60* 44.07* 38.37*   No results for input(s): TROPIPOC in the last 168 hours.   BNPNo results for input(s): BNP, PROBNP in the last 168 hours.   DDimer No results for input(s): DDIMER in the last 168 hours.   Radiology    No results found.  Cardiac Studies   ECHO 10/12/16  - Left ventricle: The cavity size was normal. Systolic function was   moderately to severely reduced. The estimated ejection fraction   was in the range of 30% to 35%. Severe hypokinesis of the   apicalanteroseptal, anterior, anterolateral, inferior, and apical   myocardium; consistent with infarction in the distribution of the   left anterior descending coronary artery. Features are consistent   with a pseudonormal left ventricular filling pattern, with   concomitant abnormal relaxation and increased filling pressure   (grade 2 diastolic dysfunction). Acoustic contrast opacification   revealed no evidence ofthrombus. - Aortic valve: There was mild to moderate regurgitation. - Mitral valve: Calcified annulus. There was mild to moderate   regurgitation  directed eccentrically and posteriorly.  CATH 10/11/16  Diagnostic Diagram     Post-Intervention Diagram     Implants     Permanent Stent  Stent Synergy Des 3x16 - [id number] - Implanted    Inventory item: Stent Synergy Des 3x16 Model/Cat number: [id number]  Manufacturer: [employer] Lot number: [id number]  Device identifier: [id number] Device identifier type: GS1  Area Of Implantation: Mid LAD    GUDID Information   Request status Successful    Brand name: SYNERGY Version/Model: [id number]  Company name: [employer] MRI safety info as of 10/11/16: MR Conditional  Contains dry or latex rubber: No    GMDN P.T. name: Coronary angioplasty balloon catheter, basic          Patient Profile     66 y.o. male  with multiple thrombotic complications in rapid succession: acute pulmonary embolism [id number], multiple embolic cerebral emboli [id number], anterior STEMI s/p LAD-DES Q000111Q, complicated by pulmonary edema, acute LVEF25-35%, improved to 30-35% (prolonged and marked elevation in troponin), hypertension, hyperlipidemia, gastroesophageal reflux, history of depression/bipolar disorder. 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   Assessment & Plan    1. Acute systolic HF in setting of acute anterior MI:  --The elevation in troponin was severe and sustained. TTE shows some improvement compared to cath study, but LVEF still reduced at 30-35% with associated anterior hypokinesis.  --This is consistent with his MI from mid LAD occlusion. ECG is concerning for aneurysm formation, risk of mechanical complications.  --He is tolerating low dose Coreg and lisinopril SBP improved today (was in low 90s last night). Will switch lisinopril to losartan to facilitate possible switch to Entresto down the road if BP tolerates.  --Volume status looks good. On po  lasix and  low-dose spiro -- Consider LifeVest per Dr. Recardo Evangelist (not called yet). 2. CAD s/p anterior STEMI and LAD-DES:  --s/p PCI of LAD. No further angina --ideally will take uninterrupted DAPT for 12 months, but with Synergy stent could interrupt temporarily at 3 months.  -- Agree with Dr. Loletha Grayer. May need to consider a non-orthodox "bridge" with cangrelor to allow early biopsy of gastric mass by EGD or abdominal lymph nodes,  but would still wait at least 30 days. Discuss with interventional Cardiologist at follow up. ASA/plavix/lipitor. --Continue cardiac rehab 3. Recent DVT/PE:  --Continue enoxaparin.He will also need enoxaparin interruption for biopsy. 4. Cryptogenic embolic strokes:  --small multifocal strokes suggest cardioembolic event. So far, AFib not documented. --on enoxaparin 5. Hypercoagulable state:  Hard decisions lie ahead regarding when the risk of temporarily stopping DAPT and anticoagulation versus delay in cancer tissue diagnosis has the optimal compromise. Now on enoxaparin anticoagulation for hypercoagulability of malignancy. Appreciate the input from Heme-Onc specialists. 6. Gastric mass --as per above 7. Probable OSA --will need outpatient sleep study  Will transfer to tele. Possible home in am. PT recommending outpatient PT and SLP. Wilt rolling walker. Will need to arrange in am .    Signed, Glori Bickers, MD  10/16/2016, 12:44 PM

## 2016-10-17 ENCOUNTER — Ambulatory Visit (HOSPITAL_COMMUNITY): Payer: Medicare Other

## 2016-10-17 ENCOUNTER — Encounter (HOSPITAL_COMMUNITY): Payer: Self-pay | Admitting: Physician Assistant

## 2016-10-17 ENCOUNTER — Inpatient Hospital Stay (HOSPITAL_COMMUNITY): Payer: Medicare Other

## 2016-10-17 ENCOUNTER — Ambulatory Visit (HOSPITAL_COMMUNITY): Payer: Medicare Other | Admitting: Speech Pathology

## 2016-10-17 ENCOUNTER — Ambulatory Visit (HOSPITAL_COMMUNITY): Payer: Medicare Other | Admitting: Physical Therapy

## 2016-10-17 ENCOUNTER — Other Ambulatory Visit: Payer: Self-pay | Admitting: Physician Assistant

## 2016-10-17 ENCOUNTER — Telehealth: Payer: Self-pay | Admitting: Physician Assistant

## 2016-10-17 DIAGNOSIS — Z8673 Personal history of transient ischemic attack (TIA), and cerebral infarction without residual deficits: Secondary | ICD-10-CM

## 2016-10-17 DIAGNOSIS — Z9861 Coronary angioplasty status: Secondary | ICD-10-CM

## 2016-10-17 DIAGNOSIS — R591 Generalized enlarged lymph nodes: Secondary | ICD-10-CM

## 2016-10-17 DIAGNOSIS — G4733 Obstructive sleep apnea (adult) (pediatric): Secondary | ICD-10-CM

## 2016-10-17 DIAGNOSIS — G4734 Idiopathic sleep related nonobstructive alveolar hypoventilation: Secondary | ICD-10-CM

## 2016-10-17 DIAGNOSIS — I2101 ST elevation (STEMI) myocardial infarction involving left main coronary artery: Secondary | ICD-10-CM

## 2016-10-17 DIAGNOSIS — I255 Ischemic cardiomyopathy: Secondary | ICD-10-CM

## 2016-10-17 DIAGNOSIS — I2510 Atherosclerotic heart disease of native coronary artery without angina pectoris: Secondary | ICD-10-CM

## 2016-10-17 DIAGNOSIS — Z86718 Personal history of other venous thrombosis and embolism: Secondary | ICD-10-CM

## 2016-10-17 DIAGNOSIS — Z86711 Personal history of pulmonary embolism: Secondary | ICD-10-CM

## 2016-10-17 DIAGNOSIS — I509 Heart failure, unspecified: Secondary | ICD-10-CM

## 2016-10-17 DIAGNOSIS — I5021 Acute systolic (congestive) heart failure: Secondary | ICD-10-CM

## 2016-10-17 LAB — CBC
HEMATOCRIT: 29 % — AB (ref 39.0–52.0)
HEMOGLOBIN: 9.5 g/dL — AB (ref 13.0–17.0)
MCH: 28.9 pg (ref 26.0–34.0)
MCHC: 32.8 g/dL (ref 30.0–36.0)
MCV: 88.1 fL (ref 78.0–100.0)
Platelets: 178 10*3/uL (ref 150–400)
RBC: 3.29 MIL/uL — ABNORMAL LOW (ref 4.22–5.81)
RDW: 14 % (ref 11.5–15.5)
WBC: 5.5 10*3/uL (ref 4.0–10.5)

## 2016-10-17 LAB — ECHOCARDIOGRAM LIMITED
HEIGHTINCHES: 73 in
WEIGHTICAEL: 3760 [oz_av]

## 2016-10-17 LAB — FACTOR 5 LEIDEN

## 2016-10-17 LAB — PROTHROMBIN GENE MUTATION

## 2016-10-17 MED ORDER — LOSARTAN POTASSIUM 25 MG PO TABS: 12.5000 mg | ORAL_TABLET | Freq: Two times a day (BID) | ORAL | 1 refills | Status: DC

## 2016-10-17 MED ORDER — ASPIRIN 81 MG PO TABS: 81.0000 mg | ORAL_TABLET | Freq: Every day | ORAL | 0 refills | Status: DC

## 2016-10-17 MED ORDER — SPIRONOLACTONE 25 MG PO TABS: 12.5000 mg | ORAL_TABLET | Freq: Every day | ORAL | 1 refills | Status: DC

## 2016-10-17 MED ORDER — ENOXAPARIN SODIUM 100 MG/ML ~~LOC~~ SOLN: 100.0000 mg | Freq: Two times a day (BID) | SUBCUTANEOUS | 1 refills | Status: DC

## 2016-10-17 MED ORDER — CLOPIDOGREL BISULFATE 75 MG PO TABS: 75.0000 mg | ORAL_TABLET | Freq: Every day | ORAL | 1 refills | Status: DC

## 2016-10-17 MED ORDER — PANTOPRAZOLE SODIUM 40 MG PO TBEC: 40.0000 mg | DELAYED_RELEASE_TABLET | Freq: Every day | ORAL | 1 refills | Status: DC

## 2016-10-17 MED ORDER — CARVEDILOL 3.125 MG PO TABS: 3.1250 mg | ORAL_TABLET | Freq: Two times a day (BID) | ORAL | 1 refills | Status: DC

## 2016-10-17 MED ORDER — FUROSEMIDE 40 MG PO TABS: 40.0000 mg | ORAL_TABLET | Freq: Every day | ORAL | 1 refills | Status: DC

## 2016-10-17 MED ORDER — NITROGLYCERIN 0.4 MG SL SUBL: 0.4000 mg | SUBLINGUAL_TABLET | SUBLINGUAL | 3 refills | Status: DC | PRN

## 2016-10-17 NOTE — Progress Notes (Signed)
Progress Note  Patient Name: [name] Date of Encounter: 10/17/2016  Primary Cardiologist: Tamala Julian (new)  Subjective   Does not remember me from the night of his coronary angiogram. Wife is not present in the room this morning. Appears pale, in no distress, and overall feels well.   Inpatient Medications    Scheduled Meds: . aspirin  81 mg Oral Daily  . atorvastatin  80 mg Oral q1800  . carvedilol  3.125 mg Oral BID WC  . clopidogrel  75 mg Oral Q breakfast  . enoxaparin (LOVENOX) injection  1 mg/kg Subcutaneous Q12H  . escitalopram  20 mg Oral Daily  . furosemide  40 mg Oral Daily  . losartan  12.5 mg Oral BID  . mouth rinse  15 mL Mouth Rinse BID  . spironolactone  12.5 mg Oral Daily   Continuous Infusions:  PRN Meds: sodium chloride, acetaminophen, ondansetron (ZOFRAN) IV, oxyCODONE-acetaminophen   Vital Signs    Vitals:   10/16/16 1208 10/16/16 2133 10/17/16 0433 10/17/16 0911  BP: 120/77 106/61 107/62   Pulse: 79 81 77 88  Resp: (!) 22 18 18    Temp: 98.8 F (37.1 C) 99.8 F (37.7 C) 97.9 F (36.6 C)   TempSrc: Oral Oral Oral   SpO2: 92% 96% 96%   Weight:      Height:        Intake/Output Summary (Last 24 hours) at 10/17/16 [id number] Last data filed at 10/17/16 0900  Gross per 24 hour  Intake                0 ml  Output             1200 ml  Net            -1200 ml   Filed Weights   10/11/16 0342  Weight: 235 lb (106.6 kg)    Telemetry    NSR - Personally Reviewed  ECG    NSR with "frozen infarct"pattern , convex ST elevation across the precordium. - Personally Reviewed  Physical Exam   GEN: No acute distress.  Sitting at bedside without complaints. Neck:  no significant JVD Cardiac: RRR, no murmurs, rubs. An S4 gallop is audible. Respiratory: Clear to auscultation bilaterally. GI: Soft, nontender, non-distended  MS: No edema; No deformity. Neuro:  Nonfocal  Psych: Normal affect   Labs    Chemistry  Recent Labs Lab 10/11/16 0340  10/12/16 0801 10/14/16 0210 10/15/16 2351  NA 139 137 133* 132*  K 3.4* 4.4 4.0 3.6  CL 106 98* 96* 94*  CO2 24 27 29 28   GLUCOSE 142* 129* 124* 118*  BUN 18 19 23* 22*  CREATININE 1.08 1.02 1.05 0.98  CALCIUM 9.7 9.5 9.4 9.1  PROT 6.9  --   --  5.5*  ALBUMIN 3.9  --   --  2.9*  AST 29  --   --  35  ALT 27  --   --  32  ALKPHOS 103  --   --  76  BILITOT 1.2  --   --  1.4*  GFRNONAA >60 >60 >60 >60  GFRAA >60 >60 >60 >60  ANIONGAP 9 12 8 10      Hematology  Recent Labs Lab 10/14/16 0210 10/15/16 2351 10/17/16 0457  WBC 7.8 6.8 5.5  RBC 3.55* 3.36* 3.29*  HGB 10.2* 9.7* 9.5*  HCT 31.6* 29.4* 29.0*  MCV 89.0 87.5 88.1  MCH 28.7 28.9 28.9  MCHC 32.3 33.0 32.8  RDW  14.0 13.7 14.0  PLT 120* 165 178    Cardiac Enzymes  Recent Labs Lab 10/12/16 1813 10/13/16 0122 10/13/16 0752 10/13/16 1457  TROPONINI >65.00* 54.60* 44.07* 38.37*   No results for input(s): TROPIPOC in the last 168 hours.   BNPNo results for input(s): BNP, PROBNP in the last 168 hours.   DDimer No results for input(s): DDIMER in the last 168 hours.   Radiology    No results found.  Cardiac Studies   ECHO 10/12/16  - Left ventricle: The cavity size was normal. Systolic function was   moderately to severely reduced. The estimated ejection fraction   was in the range of 30% to 35%. Severe hypokinesis of the   apicalanteroseptal, anterior, anterolateral, inferior, and apical   myocardium; consistent with infarction in the distribution of the   left anterior descending coronary artery. Features are consistent   with a pseudonormal left ventricular filling pattern, with   concomitant abnormal relaxation and increased filling pressure   (grade 2 diastolic dysfunction). Acoustic contrast opacification   revealed no evidence ofthrombus. - Aortic valve: There was mild to moderate regurgitation. - Mitral valve: Calcified annulus. There was mild to moderate   regurgitation directed eccentrically  and posteriorly.  CATH 10/11/16  Diagnostic Diagram     Post-Intervention Diagram           Patient Profile     66 y.o. male  with multiple thrombotic complications in rapid succession: acute pulmonary embolism [id number], multiple embolic cerebral emboli [id number], anterior STEMI s/p LAD-DES Q000111Q, complicated by pulmonary edema, acute LVEF25-35%, improved to 30-35% (prolonged and marked elevation in troponin), hypertension, hyperlipidemia, gastroesophageal reflux, history of depression/bipolar disorder. 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   Assessment & Plan    1. Acute systolic HF: Related to large anterior myocardial infarction. Quite comfortable today without evidence of volume overload. I believe LVEF continues to improve. Plan relook limited echo for LV function. If still with EF less than 35% we'll need to have life vest at discharge today. Ambulate freely. Planned discharge within the next 24-48 hours   2. CAD s/p anterior STEMI and LAD-DES: Will need to pause antiplatelet/anticoagulant therapy long enough to achieve tissue biopsy. We should be at least one month out from LAD stent prior to attempting medication withhold. Likely will need overlap intravenous antiplatelet therapy. We will discuss as we get closer and coordinate with oncology and surgical colleagues.   3. Recent DVT/PE: Anticoagulation has been changed to Enoxaparin. Will need interruption for biopsy.  4. Cryptogenic embolic strokes: small multifocal strokes suggest cardioembolic event. So far, AFib not documented.  5. Hypercoagulable state: Risk of temporarily stopping DAPT and anticoagulation versus delay in cancer tissue diagnosis will be discussed with oncology.. Now on enoxaparin anticoagulation for hypercoagulability of malignancy. Appreciate the input from  Heme-Onc specialists.     Signed, Sinclair Grooms, MD  10/17/2016, 9:38 AM

## 2016-10-17 NOTE — Care Management Important Message (Signed)
Important Message  Patient Details  Name: [name] MRN: [id number] Date of Birth: 1951/07/22   Medicare Important Message Given:  Yes    [name] 10/17/2016, 1:11 PM

## 2016-10-17 NOTE — Progress Notes (Signed)
Came to walk with pt however in echo (around 1115). Reviewed ed with wife, answered questions. Yves Dill CES, ACSM 3:38 PM 10/17/2016

## 2016-10-17 NOTE — Telephone Encounter (Signed)
Hi Triage. I am not sure who handles this in our office now that [name] has retired. 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 Care management helped assist with the lovenox. Apparently the patient has a deductible which needs to be met still so owes the out of pocket cost. She did give the patient a coupon to help cover in the interim if possible. There was possibility of prior auth approval to reduce cost. I called [employer] and spent 40 minutes on the phone to address this issue. I was informed their stat turnaround time is 24 hours for a decision. The patient does not wish to remain in the hospital at this time for this decision, nor do we feel that needs to be the case. The patient says they will fill their first script at cost. [facility] - Ref ##[id number] Can you help close the loop on this tomorrow? [employer] will be faxing to our main fax. Thanks. [name] PA-C

## 2016-10-17 NOTE — Progress Notes (Signed)
Per insurance check for Lovenox S/W NOEMAN  @ [employer] RX # [id number]   LOVENOX 100 MG SYRINGES BID  NOT COVER  PRIOR APPROVAL- YES # [phone] FOR EXCEPTION    ENOXAPARIN  100 MG BID SYRINGES  COVER- YES  CO-PAY- 100 % OF TOTAL COAST  TIER- 4 DRUG  PRIOR APPROVAL- NO

## 2016-10-17 NOTE — Discharge Summary (Signed)
The patient has been seen in conjunction with Melina Copa, PAC. All aspects of care have been considered and discussed. The patient has been personally interviewed, examined, and all clinical data has been reviewed.   He has complicated medical issues. Thankfully, the LV function has improved above 35%.  Plan to optimize HF therapy and help provide guidance through upcoming need for anticoagulation and DAPT pause to allow tissue diagnosis of cancer.  Reminded to follow stools and notify if bleeding in form of melena or BRBPR.  TOC in 10 days.  Discharge Summary    Patient ID: [name],  MRN: [id number], DOB/AGE: [birth year] 66 y.o.  Admit date: 10/11/2016 Discharge date: 10/17/2016  Primary Care Provider: Vonna Kotyk A Dettinger Primary Cardiologist: Previously Dr. Oval Linsey, will be following with Dr. Daneen Schick acutely given his interventional involvement this admission.  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  Successful PTCA and stent implantation using a 3.0 x 16 Synergy post dilated to 3.5 mm in diameter. 100% stenosis reduced to 0% with TIMI grade 3 flow.  65-75% proximal first obtuse  marginal.  Otherwise widely patent circumflex and right coronary arteries.  Significant reduction in LV function with marked elevation in LVEDP (greater than 40 mmHg). Estimated EF 30-35%.  RECOMMENDATIONS:   Complicated patient with DVT and pulmonary embolus within the past 2 months and multiple embolic strokes within the past 3 weeks. The patient is on chronic anticoagulation with Eliquis. Last dose of anticoagulation was given at 7 PM.  Recommend resuming Eliquis at 7 PM this evening.  Aspirin and Plavix combination for 4 weeks then dropped aspirin.  Aggressive risk factor modification.   2D echo Limited 10/12/16 Study Conclusions  - Left ventricle: The cavity size was normal. Systolic function was   moderately to severely reduced. The estimated ejection fraction   was in the range of 30% to 35%. Severe hypokinesis of the   apicalanteroseptal, anterior, anterolateral, inferior, and apical   myocardium; consistent with infarction in the distribution of the   left anterior descending coronary artery. Features are consistent   with a pseudonormal left ventricular filling pattern, with   concomitant abnormal relaxation and increased filling pressure   (grade 2 diastolic dysfunction). Acoustic contrast opacification   revealed no evidence ofthrombus. - Aortic valve: There was mild to moderate regurgitation. - Mitral valve: Calcified annulus. There was mild to moderate   regurgitation directed eccentrically and posteriorly.  2D Echo Limited 10/17/16 - Left ventricle: Septal apical and inferior wall hypokinesis The   cavity size was moderately dilated. Wall thickness was normal.   Systolic function was moderately reduced. The estimated ejection   fraction was in the range of 35% to 40%. Doppler parameters are  consistent with both elevated ventricular end-diastolic filling   pressure and elevated left atrial filling pressure. - Aortic valve: There was mild regurgitation. -  Mitral valve: There was mild regurgitation. - Atrial septum: No defect or patent foramen ovale was identified.    _____________     History of Present Illness     [name] is a 66 y.o. male with history of recent lymphadenopathy concerning for malignancy, recent DVTs/PE, recent stroke, HTN, HLD, GERD, depression/bipolar disorder, probable OSA, TURP 2010, CAD (prior nonobstructive LAD stenosis in [place]) who was admitted with acute STEMI. To recap recent history, he was admitted in 1/20-1/24/18 with multiple bilateral PEs and bilateral DVTs. He was discharged on Xarelto. He was admitted 2/4-09/29/16 with a stroke at which time he was switched to Eliquis. Carotid US showed <50% bilat narrowing. CT imaging of the abdomen, pelvis and chest revealed abdominal lymphadenopathy in retroperitoneum, porta hepatis, gastrohepatic and gastrosplenic ligaments, and inferior mediastinum, ddx including metastatic disease and lymphoma, also with mildly enlarged prostate, with focal contrast enhancement in the right anterior mid gland which may be due to prostatitis or prostate carcinoma. He established care with Dr. Talbert Cage (heme/onc) for further evaluation with PET scan showing: "1. Hypermetabolic gastrohepatic ligament lymph nodes, gastrosplenic lymph nodes, porta hepatis lymph nodes and distal paraesophageal lymph nodes consistent with metastatic lymph nodes. 2. Two foci within the wall of the stomach along the posterior aspect of the greater curvature. 3. Differential for above findings include gastric carcinoma, esophageal carcinoma, gastrointestinal stromal tumor, or lymphoma. 4. Recommend upper endoscopy / EUS with sampling of the perigastric lymph nodes and potentially gastric wall lesions if identifiable."  He was due to follow up in the clinic to discuss next steps. However, on 10/11/16 he awoke with severe chest pain and STEMI.  Hospital Course    E[facility] at [facility] emergency [facility] demonstrated  ST elevation in V4 56, II, III, and aVF with IRBBB. He was taken emergently to the cath lab where cath showed occlusded mLAD and 65-75% OM1, otherwise patent Cx and RCA, EF 30-35%. His mLAD was stented with a Synergy DES. He was started on ASA/Plavix given his need for anticoagulation. Troponin >65. He was seen by heme/onc who proceeded to obtain some hypercoagulability and malignancy lab studies that are pending at time of discharge. Given concern with malignancy related hypercoagulable status they recommended switching from Eliquis to lovenox 35m/kg q12h for ongoing anticoagulation. Per their note, "its a challenge trying to obtain a tissue diagnosis for his suspected abdominal malignancy/lymphoma due to need for and difficulty with interrupting anticoagulation and no superficial lesions that are easily accessible for biopsy. Endoscopic approach to evaluate stomach lesions and with EUS as needed to get biopsy would probably be the best approach." For this, antiplatelet bridging would need to be considered given his recent PCI. 2D Echo 10/12/16 showed EF 30-35% with multiple WMA, grade 2 DD, no evidence of LV thrombus, mild-mod AI, mild-mod MR (was 60-65% 09/26/16 previously). Over the next several days his HF therapy was optimized with IV diuretic and addition of guideline-directed therapy. He worked with cardiac rehab, PT and speech. He was able to be transitioned to oral Lasix. F/u echo was obtained to evaluate candidacy for LifeVest - this showed EF 35-40%, septal apical and inferior wall hypokinesis, elevated LVEDP, mild AI/MR - given the EF >35%, no LifeVest was needed. The patient's wife was educated on Lovenox administration. The patient feels improved today. Dr. STamala Julianhas seen and examined  the patient today and feels he is stable for discharge. Going forward in order to pursue the biopsies, will need to pause antiplatelet/anticoagulant therapy long enough to achieve tissue biopsy. We should be at least one  month out from LAD stent prior to attempting medication withhold. This will likely require intravenous antiplatelet therapy (i.e. cangrelor). The cardio team will discuss as we get closer and coordinate with oncology and surgical colleagues. The patient will need repeat CBC at time of his follow-up appointment given his progressive anemia which was felt due to acute illness. He denied any bleeding and was educated on signs of bleeding. He also had thrombocytopenia but this appears to have improved. I did not order the CBC in case the patient needs other labs at that visit to avoid a double stick - will need to be ordered at f/u appt. The plan tentatively is to stop aspirin after 1 month, and continue Plavix and enoxaparin afterwards - the enoxaparin will ultimately be driven by heme-onc.  I sent meds locally to [facility] as requested; if meds are to remain the same at f/u appointment, the refills will need to be sent in to mail order. Care management helped assist with the lovenox. Apparently the patient has a deductible which needs to be met still so owes the out of pocket cost. She did give the patient a coupon to help cover in the interim if possible. There was possibility of prior auth approval to reduce cost. I called [employer] and spent 40 minutes on the phone to address this issue. I was informed their stat turnaround time is 24 hours for a decision. The patient does not wish to remain in the hospital at this time. They will fill their first script at cost. Will await decision from [facility] about prior auth - Ref #[id number].  The following issues were tended to at time of discharge: - sent message to our office's patient care coordinator to arrange sleep study to evaluate for sleep apnea given witnessed nocturnal hypoxia; no longer requiring O2 at rest during the day - sent message to Dr. Thompson Caul nurse to help arrange [facility] visit in 7-10 days with Dr. Tamala Julian himself. Given the complexity of issues and decision  making, even though patient lives closer to [place] he is agreeable to continuity of care keeping with the same physician that saw him this admission. The office will call him with this appointment. - also sent request to Dr. Thompson Caul nurse to touch base with Dr. Laverle Patter office to have her review this discharge summary and decide timing of when to f/u oncology. He missed an appt while in the hospital. - reviewed Lovenox dosing at discharge with pharmacy given outpatient available vials; pharmD recommended 124m [place] BID.  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 rate 18, height _0  (1.854 m), weight 235 lb (106.6 kg), SpO2 97 %.  Filed Weights   10/11/16 0342  Weight: 235 lb (106.6 kg)    Labs & Radiologic Studies    CBC  Recent Labs  10/15/16 2351 10/17/16 0457  WBC 6.8 5.5  HGB 9.7* 9.5*  HCT 29.4* 29.0*  MCV 87.5 88.1  PLT 165 585   Basic Metabolic Panel  Recent Labs  10/15/16 2351  NA 132*  K 3.6  CL 94*  CO2 28  GLUCOSE 118*  BUN 22*  CREATININE 0.98  CALCIUM 9.1   Liver Function Tests  Recent Labs  10/15/16 2351  AST 35  ALT 32  ALKPHOS 76  BILITOT 1.4*  PROT 5.5*  ALBUMIN 2.9*  _____________  Mr Brain Wo Contrast  Result Date: 09/26/2016 CLINICAL DATA:  Confusion and altered mental status over the last 2 days. EXAM: MRI HEAD WITHOUT CONTRAST MRA HEAD WITHOUT CONTRAST TECHNIQUE: Multiplanar, multiecho pulse sequences of the brain and surrounding structures were obtained  without intravenous contrast. Angiographic images of the head were obtained using MRA technique without contrast. COMPARISON:  CT 09/25/2016 FINDINGS: MRI HEAD FINDINGS Brain: There is scattered punctate acute infarctions scattered throughout all vascular territories of the anterior and posterior circulation. These are most extensive in the left MCA territory were there are dozens of small discrete infarctions. No large confluent infarction. The findings are consistent with embolic infarctions from the heart or ascending aorta. The brain does not show a background pattern of chronic disease. No evidence of mass lesion, hemorrhage, hydrocephalus or extra-axial collection. Vascular: Major vessels at the base of the brain show flow. Skull and upper cervical spine: Negative Sinuses/Orbits: Clear/normal Other: None MRA HEAD FINDINGS Both internal carotid arteries are widely patent into the brain. The anterior and middle cerebral vessels are patent without proximal stenosis, aneurysm or vascular malformation. There is probably an occluded M2 branch of the left middle cerebral artery. No posterior circulation large or medium vessel abnormality is seen. Incidental vertebral fenestration. 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 Most extensive in the region of involvement is within the left MCA territory where there appears to be a missing left M2 branch. No large confluent infarction however. No hemorrhage, swelling or mass effect. No pre-existing brain disease identified. Electronically Signed   By: Nelson Chimes M.D.   On: 09/26/2016 10:41   Ct Abdomen Pelvis W Contrast  Result Date: 09/28/2016 CLINICAL DATA:  Abdominal lymphadenopathy. EXAM: CT ABDOMEN AND PELVIS WITH CONTRAST TECHNIQUE: Multidetector CT imaging of the abdomen and pelvis was performed using the standard protocol following  bolus administration of intravenous contrast. CONTRAST:  18m ISOVUE-300 IOPAMIDOL (ISOVUE-300) INJECTION 61% COMPARISON:  Chest CTA on 09/27/2016 FINDINGS: Lower Chest: Tiny left pleural effusion and left basilar atelectasis. Mild lymphadenopathy in the inferior mediastinum along the posterior aspect of the distal esophagus. Hepatobiliary:  No masses identified. Gallbladder is unremarkable. Pancreas:  No mass or inflammatory changes. Spleen: Within normal limits in size and appearance. Adrenals/Urinary Tract: No masses identified. No evidence of hydronephrosis. Stomach/Bowel: No evidence of obstruction, inflammatory process or abnormal fluid collections. Vascular/Lymphatic: Lymphadenopathy is seen in the gastrohepatic and gastrosplenic ligaments and porta hepatis, with index lymph node in the porta hepatis measure 2.7 cm on image 29/2. Mild retroperitoneal lymphadenopathy is also seen in the aorta caval and left paraaortic spaces, with index lymph node in the portacaval space are measuring 1.7 cm on image 41/2. No pathologically enlarged lymph nodes identified within the pelvis. Aortic atherosclerosis.  No abdominal aortic aneurysm. Reproductive: Mildly enlarged prostate, with focal contrast enhancement in right anterior mid gland, which may be due to prostatitis or prostate carcinoma. Normal appearance of  seminal vesicles. Other:  None. Musculoskeletal:  No suspicious bone lesions identified. IMPRESSION: Abdominal lymphadenopathy in retroperitoneum, porta hepatis, gastrohepatic and gastrosplenic ligaments, and inferior mediastinum. Differential diagnosis includes metastatic disease and lymphoma. Mildly enlarged prostate, with focal contrast enhancement in the right anterior mid gland which may be due to prostatitis or prostate carcinoma. No pelvic lymphadenopathy identified. Electronically Signed   By: Earle Gell M.D.   On: 09/28/2016 15:53   US Carotid Bilateral (at [facility] And [facility] Only)  Result Date:  09/26/2016 CLINICAL DATA:  CVA. EXAM: BILATERAL CAROTID DUPLEX ULTRASOUND TECHNIQUE: Pearline Cables scale imaging, color Doppler and duplex ultrasound were performed of bilateral carotid and vertebral arteries in the neck. COMPARISON:  None. FINDINGS: Criteria: Quantification of carotid stenosis is based on velocity parameters that correlate the residual internal carotid diameter with NASCET-based stenosis levels, using the diameter of the distal internal carotid lumen as the denominator for stenosis measurement. The following velocity measurements were obtained: RIGHT ICA:  76 cm/sec CCA:  78 cm/sec SYSTOLIC ICA/CCA RATIO:  1.0 DIASTOLIC ICA/CCA RATIO:  1.6 ECA:  88 cm/sec LEFT ICA:  63 cm/sec CCA:  88 cm/sec SYSTOLIC ICA/CCA RATIO:  0.7 DIASTOLIC ICA/CCA RATIO:  1.6 ECA:  96 cm/sec RIGHT CAROTID ARTERY: Small amount of plaque at the right carotid bulb without significant stenosis. External carotid artery is patent with normal waveform. Minimal plaque at the origin of the internal carotid artery. Normal waveforms and velocities in the internal carotid artery. RIGHT VERTEBRAL ARTERY: Antegrade flow and normal waveform in the right vertebral artery. LEFT CAROTID ARTERY: Minimal plaque at the left carotid bulb. External carotid artery is patent with normal waveform. Normal waveforms and velocities in the internal carotid artery. LEFT VERTEBRAL ARTERY: Antegrade flow and normal waveform in the left vertebral artery. IMPRESSION: Minimal atherosclerotic disease in the carotid arteries. No significant carotid artery stenosis. Estimated degree of stenosis in the internal carotid arteries is less than 50% bilaterally. Patent vertebral arteries with antegrade flow. Electronically Signed   By: Markus Daft M.D.   On: 09/26/2016 09:51   Nm Pet Image Initial (pi) Skull Base To Thigh  Result Date: 10/10/2016 CLINICAL DATA:  Initial treatment strategy for abdominal lymphadenopathy. EXAM: NUCLEAR MEDICINE PET SKULL BASE TO THIGH TECHNIQUE:  11.6 mCi F-18 FDG was injected intravenously. Full-ring PET imaging was performed from the skull base to thigh after the radiotracer. CT data was obtained and used for attenuation correction and anatomic localization. FASTING BLOOD GLUCOSE:  Value: 106 mg/dl COMPARISON:  CT 09/28/2016 FINDINGS: NECK No hypermetabolic lymph nodes in the neck. CHEST Hypermetabolic nodule along the distal esophagus with SUV max equal 6.4 just above the GE junction. No additional hypermetabolic mediastinal adenopathy. No suspicious pulmonary nodules. There are bilateral small pleural effusions. ABDOMEN/PELVIS There are 2 foci of metabolic activity within which appear to be within the wall of the stomach along the posterior aspect of the greater curvature. These discrete nodules are intense with SUV max equal 12.9. Discrete lesions are not evident on the CT portion exam. No clear lesion on comparison CT. Adjacent to the stomach within the gastrosplenic ligament there 2 rounded necrotic lymph nodes with SUV max equal 11.5 and measuring 2.2 cm There is hypermetabolic lymph node in the gastrohepatic ligament which is similar with SUV max equal 13.5 measuring 2.3 cm. There are enlarged hypermetabolic porta hepatis lymph nodes which are similar. Several upper abdominal retroperitoneal lymph nodes along the aorta and IVC intense metabolic activity (SUV max equal 12.1). No hypermetabolic lymph nodes in  the pelvis. SKELETON No focal hypermetabolic activity to suggest skeletal metastasis. IMPRESSION: 1. Hypermetabolic gastrohepatic ligament lymph nodes, gastrosplenic lymph nodes, porta hepatis lymph nodes and distal paraesophageal lymph nodes consistent with metastatic lymph nodes. 2. Two foci within the wall of the stomach along the posterior aspect of the greater curvature. 3. Differential for above findings include gastric carcinoma, esophageal carcinoma, gastrointestinal stromal tumor, or lymphoma. 4. Recommend upper endoscopy / EUS with  sampling of the perigastric lymph nodes and potentially gastric wall lesions if identifiable. These results will be called to the ordering clinician or representative by the Radiologist Assistant, and communication documented in the PACS or zVision Dashboard. Electronically Signed   By: Suzy Bouchard M.D.   On: 10/10/2016 17:55   US Venous Img Lower Unilateral Left  Result Date: 10/07/2016 CLINICAL DATA:  Left groin pain for 2 weeks. History of blood clots. EXAM: LEFT LOWER EXTREMITY VENOUS DOPPLER ULTRASOUND TECHNIQUE: Gray-scale sonography with graded compression, as well as color Doppler and duplex ultrasound were performed to evaluate the lower extremity deep venous systems from the level of the common femoral vein and including the common femoral, femoral, profunda femoral, popliteal and calf veins including the posterior tibial, peroneal and gastrocnemius veins when visible. The superficial great saphenous vein was also interrogated. Spectral Doppler was utilized to evaluate flow at rest and with distal augmentation maneuvers in the common femoral, femoral and popliteal veins. COMPARISON:  None. FINDINGS: Contralateral Common Femoral Vein: Respiratory phasicity is normal and symmetric with the symptomatic side. No evidence of thrombus. Normal compressibility. Common Femoral Vein: No evidence of thrombus. Normal compressibility, respiratory phasicity and response to augmentation. Saphenofemoral Junction: No evidence of thrombus. Normal compressibility and flow on color Doppler imaging. Profunda Femoral Vein: No evidence of thrombus. Normal compressibility and flow on color Doppler imaging. Femoral Vein: No evidence of thrombus. Normal compressibility, respiratory phasicity and response to augmentation. Popliteal Vein: No evidence of thrombus. Normal compressibility, respiratory phasicity and response to augmentation. Calf Veins: No evidence of thrombus. Normal compressibility and flow on color Doppler  imaging. Superficial Great Saphenous Vein: No evidence of thrombus. Normal compressibility and flow on color Doppler imaging. Venous Reflux:  None. Other Findings:  None. IMPRESSION: No evidence of deep venous thrombosis. Electronically Signed   By: Lajean Manes M.D.   On: 10/07/2016 12:14   US Aorta  Result Date: 09/21/2016 CLINICAL DATA:  Family history of abdominal aortic aneurysm EXAM: ULTRASOUND OF ABDOMINAL AORTA TECHNIQUE: Ultrasound examination of the abdominal aorta was performed to evaluate for abdominal aortic aneurysm. COMPARISON:  None. FINDINGS: Abdominal Aorta No aneurysm identified. Maximum Diameter: Proximal aorta measures 2.5 x 2.8 cm in diameter. Mid aorta measures 2.1 x 2 cm in diameter. Distal aorta measures 1.9 x 2 cm in diameter. Right common iliac artery measures 0.9 x 1 cm in diameter. Left common iliac artery measures 0.9 x 1 cm in diameter. IMPRESSION: No evidence of abdominal aortic aneurysm. Abdominal aorta measures 2.5 x 2.8 cm maximum diameter proximally. Electronically Signed   By: Lahoma Crocker M.D.   On: 09/21/2016 11:25   Dg Chest Port 1 View  Result Date: 10/11/2016 CLINICAL DATA:  Chest pain EXAM: PORTABLE CHEST 1 VIEW COMPARISON:  Chest CT 09/27/2016 FINDINGS: There is diffuse pulmonary edema. Cardiomediastinal silhouette is normal. No large pneumothorax or pleural effusion. IMPRESSION: Moderate pulmonary edema. Electronically Signed   By: Ulyses Jarred M.D.   On: 10/11/2016 04:09   Mr Jodene Nam Head/brain SL Cm  Result Date: 09/26/2016 CLINICAL DATA:  Confusion and altered mental status over the last 2 days. EXAM: MRI HEAD WITHOUT CONTRAST MRA HEAD WITHOUT CONTRAST TECHNIQUE: Multiplanar, multiecho pulse sequences of the brain and surrounding structures were obtained without intravenous contrast. Angiographic images of the head were obtained using MRA technique without contrast. COMPARISON:  CT 09/25/2016 FINDINGS: MRI HEAD FINDINGS Brain: There is scattered punctate acute  infarctions scattered throughout all vascular territories of the anterior and posterior circulation. These are most extensive in the left MCA territory were there are dozens of small discrete infarctions. No large confluent infarction. The findings are consistent with embolic infarctions from the heart or ascending aorta. The brain does not show a background pattern of chronic disease. No evidence of mass lesion, hemorrhage, hydrocephalus or extra-axial collection. Vascular: Major vessels at the base of the brain show flow. Skull and upper cervical spine: Negative Sinuses/Orbits: Clear/normal Other: None MRA HEAD FINDINGS Both internal carotid arteries are widely patent into the brain. The anterior and middle cerebral vessels are patent without proximal stenosis, aneurysm or vascular malformation. There is probably an occluded M2 branch of the left middle cerebral artery. No posterior circulation large or medium vessel abnormality is seen. Incidental vertebral fenestration. 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 Most extensive in the region of involvement is within the left MCA territory where there appears to be a missing left M2 branch. No large confluent infarction however. No hemorrhage, swelling or mass effect. No pre-existing brain disease identified. Electronically Signed   By: Nelson Chimes M.D.   On: 09/26/2016 10:41   Ct Angio Chest Aorta W/cm &/or Wo/cm  Result Date: 09/27/2016 CLINICAL DATA:  New CVA. Questionable aortic dilation on transthoracic echo. EXAM: CT ANGIOGRAPHY CHEST WITH CONTRAST TECHNIQUE: Multidetector CT imaging of the chest was performed using the standard protocol during bolus administration of intravenous contrast. Multiplanar CT image reconstructions and MIPs were obtained to evaluate the vascular anatomy. CONTRAST:  100 cc Isovue 370 intravenously.  COMPARISON:  None. FINDINGS: Cardiovascular: Satisfactory opacification of the pulmonary arteries to the segmental level. Filling defects within the pulmonary arterial branch to the left lower lobe and segmental pulmonary arterial branch in the left lung base likely represents residual pulmonary emboli. The remaining of the pulmonary arteries are normally opacified. Normal heart size. No pericardial effusion. The ascending thoracic aorta is mildly dilated measuring 4.4 cm in diameter. Mediastinum/Nodes: No enlarged mediastinal, hilar, or axillary lymph nodes. Thyroid gland, trachea, and esophagus demonstrate no significant findings. Small collection of lymph nodes versus paraesophageal varices is seen in the lower thorax. Lungs/Pleura: Persistent left lower lobe airspace consolidation versus atelectasis or hypoperfusion changes. Upper Abdomen: Soft tissue masses seen within the gastrohepatic ligament, porta hepaticus and in the left upper quadrant along the splenic artery. Musculoskeletal: No chest wall abnormality. No acute or significant osseous findings. Review of the MIP images confirms the above findings. 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 Recommend followup by ultrasound in 3 years. This recommendation follows ACR consensus guidelines: White Paper of the ACR Incidental Findings Committee II on Vascular Findings. 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 Left lower lobe airspace consolidation, atelectasis or hypoperfusion changes. Nodular left pleural thickening. This may represent loculated pleural effusion, however pleural metastatic disease cannot be excluded. Soft tissue masses in  gastrohepatic ligament, porta hepaticus and retroperitoneum of the upper abdomen, which may represent lymphadenopathy. Further  evaluation with abdomen and pelvis CT with contrast is recommended. Periesophageal lymph nodes versus varices. These results were called by telephone at the time of interpretation on 09/27/2016 at 11:30 am to Dr. Nita Sells , who verbally acknowledged these results. Electronically Signed   By: Fidela Salisbury M.D.   On: 09/27/2016 11:40   Ct Head Code Stroke Wo Contrast`  Result Date: 09/25/2016 CLINICAL DATA:  Code stroke.  Confusion and dysphagia since 10 a.m. EXAM: CT HEAD WITHOUT CONTRAST TECHNIQUE: Contiguous axial images were obtained from the base of the skull through the vertex without intravenous contrast. COMPARISON:  None. FINDINGS: Brain: No evidence of acute infarction, hemorrhage, hydrocephalus, extra-axial collection or mass lesion/mass effect. Vascular: Atherosclerotic calcification. Skull: No acute or aggressive finding. Mild enlargement of biparietal foramina. Sinuses/Orbits: Negative Other: These results were called by telephone at the time of interpretation on 09/25/2016 at 2:05 pm to Dr. Reather Converse, who verbally acknowledged these results. ASPECTS [facility] Stroke Program Early CT Score) - Ganglionic level infarction (caudate, lentiform nuclei, internal capsule, insula, M1-M3 cortex): 7 - Supraganglionic infarction (M4-M6 cortex): 3 Total score (0-10 with 10 being normal): 10 IMPRESSION: No acute finding. ASPECTS is 10. Electronically Signed   By: Monte Fantasia M.D.   On: 09/25/2016 14:06   Disposition   Pt is being discharged home today in good condition.  Follow-up Plans & Appointments    Follow-up Paris III, MD Follow up.   Specialty:  Cardiology Why:  Dr. Thompson Caul office will call you for a follow-up appointment to occur in 7-10 days. If you have not heard back within 2 days, please call. Contact information: [address] [address] [phone]        Twana First, MD Follow up.   Specialty:  Oncology Why:   Dr. Thompson Caul nurse will be contacting Dr. Laverle Patter office to have her review your recent discharge summary to determine timing of follow-up appointment. Please call Dr. Laverle Patter office to discuss if you haven't heard back in 4 days. Contact information: [place] [address] [phone]          Discharge Instructions    Amb Referral to Cardiac Rehabilitation    Complete by:  As directed    Diagnosis:  STEMI   Amb Referral to Cardiac Rehabilitation    Complete by:  As directed    Diagnosis:   Coronary Stents STEMI     Diet - low sodium heart healthy    Complete by:  As directed    Increase activity slowly    Complete by:  As directed    No driving until cleared by your cardiologist. No lifting over 10 lbs for 4 weeks. No sexual activity for 4 weeks. You may not return to work until cleared by your cardiologist, if applicable. Keep procedure site clean & dry. If you notice increased pain, swelling, bleeding or pus, call/return!  You may shower, but no soaking baths/hot tubs/pools for 1 week.   Some studies suggest Prilosec/Omeprazole interacts with Plavix. We changed your Prilosec/Omeprazole to the equivalent dose of Protonix for less chance of interaction.  Please make sure to request refills of your medicine to your mail order at follow-up appointment if medications are continued.  If you notice any bleeding such as blood in stool, black tarry stools, blood in urine, nosebleeds or any other unusual  bleeding, call your doctor immediately.      Discharge Medications   Allergies as of 10/17/2016   No Known Allergies     Medication List    STOP taking these medications   amLODipine 5 MG tablet Commonly known as:  NORVASC   apixaban 5 MG Tabs tablet Commonly known as:  ELIQUIS   omeprazole 20 MG capsule Commonly known as:  PRILOSEC     TAKE these medications   acetaminophen 650 MG CR tablet Commonly known as:  TYLENOL Take 650 mg by mouth every 6 (six)  hours as needed for pain.   aspirin 81 MG tablet Take 1 tablet (81 mg total) by mouth daily. For 1 month then stop. Start taking on:  10/18/2016   atorvastatin 80 MG tablet Commonly known as:  LIPITOR Take 1 tablet (80 mg total) by mouth daily.   carvedilol 3.125 MG tablet Commonly known as:  COREG Take 1 tablet (3.125 mg total) by mouth 2 (two) times daily with a meal.   clopidogrel 75 MG tablet Commonly known as:  PLAVIX Take 1 tablet (75 mg total) by mouth daily.   enoxaparin 100 MG/ML injection Commonly known as:  LOVENOX Inject 1 mL (100 mg total) into the skin every 12 (twelve) hours.   escitalopram 20 MG tablet Commonly known as:  LEXAPRO Take 1 tablet (20 mg total) by mouth daily.   furosemide 40 MG tablet Commonly known as:  LASIX Take 1 tablet (40 mg total) by mouth daily. Start taking on:  10/18/2016   losartan 25 MG tablet Commonly known as:  COZAAR Take 0.5 tablets (12.5 mg total) by mouth 2 (two) times daily.   nitroGLYCERIN 0.4 MG SL tablet Commonly known as:  NITROSTAT Place 1 tablet (0.4 mg total) under the tongue every 5 (five) minutes as needed for chest pain (up to 3 doses).   pantoprazole 40 MG tablet Commonly known as:  PROTONIX Take 1 tablet (40 mg total) by mouth daily.   spironolactone 25 MG tablet Commonly known as:  ALDACTONE Take 0.5 tablets (12.5 mg total) by mouth daily. Start taking on:  10/18/2016            Durable Medical Equipment        Start     Ordered   10/17/16 1416  For home use only DME 4 wheeled rolling walker with seat  Once    Question:  Patient needs a walker to treat with the following condition  Answer:  Weakness due to cerebrovascular accident (CVA) ([place])   10/17/16 1415   10/17/16 1321  For home use only DME Walker rolling  Once    Question:  Patient needs a walker to treat with the following condition  Answer:  Muscular deconditioning   10/17/16 1320   10/17/16 1318  For home use only DME 3 n 1  Once      10/17/16 1317       Allergies:  No Known Allergies  Aspirin prescribed at discharge?  Yes High Intensity Statin Prescribed? (Lipitor 40-70m or Crestor 20-477m: Yes Beta Blocker Prescribed? Yes For EF <40%, was ACEI/ARB Prescribed? Yes ADP Receptor Inhibitor Prescribed? (i.e. Plavix etc.-Includes Medically Managed Patients): Yes For EF <40%, Aldosterone Inhibitor Prescribed? Yes Was EF assessed during THIS hospitalization? Yes Was Cardiac Rehab II ordered? (Included Medically managed Patients): Yes   Outstanding Labs/Studies   Will need CBC as OUTPATIENT  Duration of Discharge Encounter   Greater than 30 minutes including physician time.  Signed,  Dayna N Dunn PA-C 10/17/2016, 4:59 PM

## 2016-10-17 NOTE — Progress Notes (Signed)
Physical Therapy Treatment Patient Details Name: [name] MRN: [id number] DOB: 09/30/1950 Today's Date: 10/17/2016    History of Present Illness pt presents post STEMI with recent PE on 1/20 and CVA on 2/4.  pt with PMH of HTN, Depression, Bipolar, CHF, and Abdominal Lymphadenopathy.      PT Comments    Pt very pleasant, moves well without the walker but still requires supervision due to cognition. Pt with HR 83-102 with activity and fatigued end of session stating that concentrating with mobility is taxing. Pt remains unable to dual task and stops to answer questions with therapist. Will follow acutely to maximize independence.    Follow Up Recommendations  Outpatient PT     Equipment Recommendations       Recommendations for Other Services       Precautions / Restrictions Precautions Precautions: Fall    Mobility  Bed Mobility               General bed mobility comments: EOB on arrival  Transfers Overall transfer level: Modified independent                  Ambulation/Gait Ambulation/Gait assistance: Supervision Ambulation Distance (Feet): 750 Feet Assistive device: None Gait Pattern/deviations: Step-through pattern;Decreased stride length   Gait velocity interpretation: Below normal speed for age/gender General Gait Details: pt with good safety with gait, controlled stride and speed but unable to turn head, converse or dual task while walking. Pt able to find his way to his room although unable to recall room number. No gross deficits with clear hall ambulation.    Stairs            Wheelchair Mobility    Modified Rankin (Stroke Patients Only)       Balance Overall balance assessment: Needs assistance   Sitting balance-Leahy Scale: Good       Standing balance-Leahy Scale: Good                      Cognition Arousal/Alertness: Awake/alert Behavior During Therapy: WFL for tasks assessed/performed Overall Cognitive  Status: History of cognitive impairments - at baseline Area of Impairment: Attention   Current Attention Level: Selective         Problem Solving: Slow processing General Comments: pt continues to have difficulty with attention and multi-tasking    Exercises      General Comments        Pertinent Vitals/Pain Pain Assessment: No/denies pain    Home Living                      Prior Function            PT Goals (current goals can now be found in the care plan section) Progress towards PT goals: Progressing toward goals    Frequency           PT Plan Current plan remains appropriate    Co-evaluation             End of Session Equipment Utilized During Treatment: Gait belt Activity Tolerance: Patient tolerated treatment well Patient left: in chair;with call bell/phone within reach Nurse Communication: Mobility status PT Visit Diagnosis: Difficulty in walking, not elsewhere classified (R26.2)     Time: [id number] PT Time Calculation (min) (ACUTE ONLY): 27 min  Charges:  $Gait Training: 23-37 mins                    G Codes:  Jaisha Villacres B Elijan Googe 10/17/2016, 9:15 AM  Elwyn Reach, [place]

## 2016-10-17 NOTE — Progress Notes (Signed)
CARDIAC REHAB PHASE I   PRE:  Rate/Rhythm:73 SR  BP:  Sitting: 93/61        SaO2: 93 RA  MODE:  Ambulation: 200 ft   POST:  Rate/Rhythm: 84 SR  BP:  Sitting: 99/63         SaO2: 95 RA  Pt states he is discharging today, pt wife requesting for pt to walk with cardiac rehab using rollator prior to discharge. Pt ambulated 200 ft on RA, rollator, assist x1, steady gait, tolerated fairly well. Pt c/o mild dizziness, general fatigue, states he has had a busy day. Placed phase 2 order, will send referral to [place]. Pt to bed per pt request after walk, call bell within reach.   [place], RN, BSN 10/17/2016 3:42 PM

## 2016-10-17 NOTE — Care Management Note (Signed)
Case Management Note Marvetta Gibbons RN, BSN Unit 2W-Case Manager [phone]  Patient Details  Name: [name] MRN: [id number] Date of Birth: 08/04/1951  Subjective/Objective:  Pt admitted with STEMI, hx of recent CVA                  Action/Plan: PTA pt lived at home with spouse- plan to return home with spouse- referral for lovenox needs- and DME- pt had been referred to oupt PT/OT/SP- at [facility] outpt therapy on [facility] discharge [facility]- pt and wife would like to continue with outpt services- and will f/u with them. Pt and wife requesting 3n1 and rollator- orders have been placed- notified Santiago Glad with [facility] for DME needs- on delivery of DME declined 3n1- did take the rollator for home- Per PA- pt will need lovenox on discharge- was on both eliquis and Xarelto in past- used 30 day free cards- but then drugs were out of pocket cost- due to pt not having met his deductible. Have submitted for insurance check on Lovenox-  Per wife they use [facility] in [place]- call made to that pharmacy and they have 14 syringes in stock- would have to order more to fill order.   Expected Discharge Date:  10/17/16              Expected Discharge Plan:  [place]  In-House Referral:     Discharge planning Services  CM Consult  Post Acute Care Choice:    Choice offered to:     DME Arranged:    DME Agency:     HH Arranged:    HH Agency:     Status of Service:  In process, will continue to follow  If discussed at Long Length of Stay Meetings, dates discussed:    Discharge Disposition: home/self care   Additional Comments:  10/17/16- 1600- Marvetta Gibbons RN, CM- received insurance check back- Lovenox-generic would be out of pocket expensive- due to pt not meeting deductible yet- could try to see if Brand could be  Pre-approved under exception- have spoken with PA- Melina Copa regarding this- per [employer]- cost out of pocket for 14 syringes would be $682.77, spoke with pt  and wife to go over Lovenox cost- also gave wife option to use GoodRx coupon- for out of pocket at [employer]- coupon supplied to wife- (cost would be around $348.95)- will have PA print  Paper script for lovenox that they can take to any pharmacy. PA is going to try to call in pre-auth for exception with Brand name Lovenox. Once pt meets out of pocket deductible - cost for Lovenox should go down- encouraged wife to call [employer] to find out what cost will be after deductible meet and what the deductible is.   Dawayne Patricia, RN 10/17/2016, 4:57 PM

## 2016-10-17 NOTE — Progress Notes (Signed)
Echocardiogram 2D Echocardiogram limited has been performed.  [name] 10/17/2016, 11:30 AM

## 2016-10-17 NOTE — Evaluation (Signed)
Speech Language Pathology Evaluation Patient Details Name: [name] MRN: [id number] DOB: 1951-01-08 Today's Date: 10/17/2016 Time: 1000-1040 SLP Time Calculation (min) (ACUTE ONLY): 40 min  Problem List:  Patient Active Problem List   Diagnosis Date Noted  . Obstructive sleep apnea 10/17/2016  . ST elevation myocardial infarction (STEMI) ([place]) 10/11/2016  . Hypercoagulable state ([place])   . Abdominal malignancy ([place])   . Lymphadenopathy, abdominal 10/03/2016  . 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 Pulmonary emboli ([place]) 09/10/2016  . Acute DVT (deep venous thrombosis) ([place]) 09/10/2016  . Hyponatremia 09/10/2016  . Normocytic anemia 09/10/2016  . Acute pulmonary embolism ([place]) 09/10/2016  . Essential hypertension, benign 06/10/2015  . Depression 06/10/2015  . Hyperlipidemia LDL goal <130 06/10/2015  . GERD (gastroesophageal reflux disease) 06/10/2015   Past Medical History:  Past Medical History:  Diagnosis Date  . Depression   . DVT (deep venous thrombosis) ([place])   . GERD (gastroesophageal reflux disease)   . 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 Hyperlipidemia   . Hypertension   . PE (pulmonary thromboembolism) ([place])   . Stroke [facility])    Past Surgical History:  Past Surgical History:  Procedure Laterality Date  . adnoidectomy    . APPENDECTOMY    . CORONARY STENT INTERVENTION N/A 10/11/2016   Procedure: Coronary Stent Intervention;  Surgeon: Belva Crome, MD;  Location: [place] [facility];  Service: Cardiovascular;  Laterality: N/A;  . LEFT HEART CATH AND CORONARY ANGIOGRAPHY N/A 10/11/2016   Procedure: Left Heart Cath and Coronary Angiography;  Surgeon: Belva Crome, MD;  Location: [place] [facility];  Service: Cardiovascular;  Laterality: N/A;  . PROSTATE SURGERY    . TONSILLECTOMY AND ADENOIDECTOMY     HPI:  66 y.o. male presents post STEMI with recent PE on 1/20 and CVA on 2/4. Pt with PMH of  HTN, Depression, Bipolar, CHF, and Abdominal Lymphadenopathy.  MRI 2/05 revealed multiple punctate infarctions scattered throughout left MCA territory, c/w embolic infarctions from heart or ascending aorta.  Underwent speech/language evaluation while at [facility] on 2/5 and was diagnosed with a moderate aphasia, for which OP SLP therapy was recommended.     Assessment / Plan / Recommendation Clinical Impression  Pt presents with a mild mixed aphasia, improved from time of initial evaluation in early February.  There are deficits in comprehension with more complex stimuli and multistep commands; pt able to read sentences and answer questions about them with 70% accuracy.  Expression marked by much improved fluency with improved grammatical form, but continued deficits in word retrieval.  Repetition of single words/simple sentences intact; inaccuracies present with less frequently produced words.  Recommend resuming plan for OP SLP services at [facility] upon D/C per spouse's request.      SLP Assessment  SLP Recommendation/Assessment: Patient needs continued Speech Lanaguage Pathology Services SLP Visit Diagnosis: Aphasia (R47.01)    Follow Up Recommendations  Outpatient SLP    Frequency and Duration           SLP Evaluation Cognition  Overall Cognitive Status: History of cognitive impairments - at baseline Orientation Level: Oriented X4       Comprehension  Auditory Comprehension Overall Auditory Comprehension: Impaired Basic Biographical Questions: 76-100% accurate Commands: Impaired One Step Basic Commands: 50-74% accurate Two Step Basic Commands: 50-74% accurate Conversation: Simple Visual Recognition/Discrimination Discrimination: Exceptions to [facility] L/R Discrimination: Able in field of 2 Reading Comprehension Sentence Level: Impaired  Expression Expression Primary Mode of Expression: Verbal Verbal Expression Overall Verbal Expression: Impaired Initiation: No  impairment Level of Generative/Spontaneous Verbalization: Sentence Repetition: Impaired Level of Impairment: Sentence level Naming: Impairment Confrontation: Impaired Convergent: 75-100% accurate Divergent: 0-24% accurate Written Expression Dominant Hand: Right   Oral / Motor  Oral Motor/Sensory Function Overall Oral Motor/Sensory Function: Within functional limits Motor Speech Overall Motor Speech: Impaired Respiration: Within functional limits Phonation: Normal Resonance: Within functional limits Intelligibility: Intelligible Motor Speech Errors: Groping for words   GO                    Juan Quam Laurice 10/17/2016, 10:50 AM

## 2016-10-17 NOTE — Progress Notes (Signed)
Educated patient and wife on lovenox adminstration.  All questions answered.  Wife attempted to give dose and will continue to give doses as needed. Pt resting with call bell within reach.  Will continue to monitor. Payton Emerald, RN

## 2016-10-18 ENCOUNTER — Encounter: Payer: Self-pay | Admitting: *Deleted

## 2016-10-18 NOTE — Progress Notes (Signed)
[place] Psychosocial Distress Screening Clinical Social Work  Clinical Social Work was referred by distress screening protocol.  The patient scored a 5 on the Psychosocial Distress Thermometer which indicates moderate distress. Clinical Social Worker reviewed chart and attempted to contact pt and wife via phone to assess for distress and other psychosocial needs. Phone rang and rang, no way to leave a message. CSW will be available if pt returns to [facility], but no follow up appointments made to date.   ONCBCN DISTRESS SCREENING 10/03/2016  Screening Type Initial Screening  Distress experienced in past week (1-10) 5  Information Concerns Type Lack of info about diagnosis;Lack of info about treatment;Lack of info about complementary therapy choices  Physician notified of physical symptoms Yes  Referral to clinical psychology No  Referral to clinical social work No  Referral to dietition No  Referral to financial advocate No  Referral to support programs No  Referral to palliative care No    Clinical Social Worker follow up needed: No.  If yes, follow up plan:  Loren Racer, [place], OSW-C [place] Tuesdays   [phone]

## 2016-10-19 ENCOUNTER — Telehealth: Payer: Self-pay | Admitting: *Deleted

## 2016-10-19 NOTE — Telephone Encounter (Signed)
Called the sleep lab and Coralyn Mark says this patient is going to [facility] for a home sleep study scheduled for 10/30/16. 10/19/16

## 2016-10-19 NOTE — Telephone Encounter (Signed)
We do not have access to the main fax in Coumadin clinic - do you know if this has been followed up on to see if we received the prior auth decision?

## 2016-10-19 NOTE — Telephone Encounter (Signed)
-----   Message from Armando Gang sent at 10/17/2016  4:33 PM EST ----- Regarding: RE: Please arrange outpatient sleep study  [place]  - this message was forward to Jaclyn Prime Gae Bon) to set up sleep study.     ----- Message ----- From: Charlie Pitter, PA-C Sent: 10/17/2016   1:26 PM To: [facility] Subject: Please arrange outpatient sleep study          For nocturnal hypoxia. Recent complex discharge. Pt lives in [place] area. Thanks. Dayna Dunn PA-C

## 2016-10-19 NOTE — Telephone Encounter (Signed)
LMTCB for pt 

## 2016-10-19 NOTE — Telephone Encounter (Signed)
Please let patient know   Thank you.

## 2016-10-19 NOTE — Telephone Encounter (Signed)
There is correspondence from [place] [facility]  in [facility] Dunn's box in Triage stating pt has been approved for Enoxaparin 100mg /ml syringe, 60/30.  The authorization is good until 10/17/2017. If you have questions you can call [phone].  I have left the correspondence from [facility] in Dayna's box in Triage.

## 2016-10-23 DIAGNOSIS — I639 Cerebral infarction, unspecified: Secondary | ICD-10-CM | POA: Insufficient documentation

## 2016-10-24 ENCOUNTER — Telehealth: Payer: Self-pay | Admitting: Cardiovascular Disease

## 2016-10-24 ENCOUNTER — Telehealth: Payer: Self-pay | Admitting: *Deleted

## 2016-10-24 ENCOUNTER — Encounter: Payer: Self-pay | Admitting: Interventional Cardiology

## 2016-10-24 ENCOUNTER — Ambulatory Visit (INDEPENDENT_AMBULATORY_CARE_PROVIDER_SITE_OTHER): Payer: Medicare Other | Admitting: Interventional Cardiology

## 2016-10-24 VITALS — BP 100/74 | HR 72 | Ht 73.0 in | Wt 232.8 lb

## 2016-10-24 DIAGNOSIS — R590 Localized enlarged lymph nodes: Secondary | ICD-10-CM | POA: Diagnosis not present

## 2016-10-24 DIAGNOSIS — I255 Ischemic cardiomyopathy: Secondary | ICD-10-CM

## 2016-10-24 DIAGNOSIS — I63133 Cerebral infarction due to embolism of bilateral carotid arteries: Secondary | ICD-10-CM

## 2016-10-24 DIAGNOSIS — I2699 Other pulmonary embolism without acute cor pulmonale: Secondary | ICD-10-CM

## 2016-10-24 DIAGNOSIS — E785 Hyperlipidemia, unspecified: Secondary | ICD-10-CM

## 2016-10-24 DIAGNOSIS — Z9861 Coronary angioplasty status: Secondary | ICD-10-CM | POA: Diagnosis not present

## 2016-10-24 DIAGNOSIS — I110 Hypertensive heart disease with heart failure: Secondary | ICD-10-CM | POA: Diagnosis not present

## 2016-10-24 DIAGNOSIS — I5021 Acute systolic (congestive) heart failure: Secondary | ICD-10-CM | POA: Diagnosis not present

## 2016-10-24 DIAGNOSIS — I10 Essential (primary) hypertension: Secondary | ICD-10-CM

## 2016-10-24 DIAGNOSIS — I2510 Atherosclerotic heart disease of native coronary artery without angina pectoris: Secondary | ICD-10-CM

## 2016-10-24 MED ORDER — FUROSEMIDE 20 MG PO TABS: 20.0000 mg | ORAL_TABLET | Freq: Every day | ORAL | 3 refills | Status: DC

## 2016-10-24 MED ORDER — CARVEDILOL 6.25 MG PO TABS: 6.2500 mg | ORAL_TABLET | Freq: Two times a day (BID) | ORAL | 3 refills | Status: DC

## 2016-10-24 NOTE — Telephone Encounter (Signed)
Pt would like to switch cardiologists from Dr. Oval Linsey to Dr. Tamala Julian.  Will route to both providers for approval.

## 2016-10-24 NOTE — Progress Notes (Signed)
Cardiology Office Note    Date:  10/24/2016   ID:  [name], DOB 1951-05-29, MRN [id number]  PCP:  Worthy Rancher, MD  Cardiologist: Sinclair Grooms, MD   Chief Complaint  Patient presents with  . Coronary Artery Disease    History of Present Illness:  [name] is a 66 y.o. 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 Additionally, there is history of abdominal lymphadenopathy including a mass within or near the stomach and the presumptive diagnosis of cancer without a tissue diagnosis.  Denies dyspnea. He has not had chest discomfort. No episodes of syncope. Denies chest discomfort. No medication side effects. No bleeding on current anticoagulation regimen which includes subcutaneous Lovenox (failed Eliquis and Xarelto). To prophylax against further thrombotic events in the setting of possible hypercoagulability associated with cancer, Plavix, and aspirin. The plan is to discontinue aspirin 4 weeks after stent implantation to decrease risk of bleeding.  LVEF post angioplasty prior to discharge revealed EF 35-40%. Life Vest was not indicated.   Past Medical History:  Diagnosis Date  . CAD in native artery    a. STEMI 09/2016 s/p DES to mLAD, residual 65-75% OM1 disease treated medically. EF 30-35% by cath, 35-40% by echo at dc.  . Chronic systolic CHF (congestive heart failure) ([place])    a. Dx 09/2016 - EF 30-35% by cath, 35-40% by echo.  . Depression   . Dilatation of aorta ([place])    a. CT 09/2016: dilation of the ascending thoracic aorta measuring 4.4 cm in maximum diameter, recommend f/u 2021 if appropriate.  [name] Kitchen DVT (deep venous thrombosis) ([place])    a. Bilat DVT noted 08/2016.  . Essential hypertension   . GERD (gastroesophageal reflux disease)   . Heart disease      some blockage in LAD  . History of stroke    a. 09/2016.  [name] Kitchen Hypercoagulable state ([place])   . Hyperlipidemia   . Hyponatremia   . Ischemic cardiomyopathy   . Lymphadenopathy    a. Abd lymphadenopathy concerning for malignancy noted in 09/2016.  [name] Kitchen Normocytic anemia   . PE (pulmonary thromboembolism) ([place])    a. Bilat PE 08/2016.  . Stroke ([place])   . Suspected sleep apnea     Past Surgical History:  Procedure Laterality Date  . adnoidectomy    . APPENDECTOMY    . CORONARY STENT INTERVENTION N/A 10/11/2016   Procedure: Coronary Stent Intervention;  Surgeon: Belva Crome, MD;  Location: [facility];  Service: Cardiovascular;  Laterality: N/A;  . LEFT HEART CATH AND CORONARY ANGIOGRAPHY N/A 10/11/2016   Procedure: Left Heart Cath and Coronary Angiography;  Surgeon: Belva Crome, MD;  Location: [facility];  Service: Cardiovascular;  Laterality: N/A;  . PROSTATE SURGERY    . TONSILLECTOMY AND ADENOIDECTOMY      Current Medications: Outpatient Medications Prior to Visit  Medication Sig Dispense Refill  . aspirin 81 MG tablet Take 1 tablet (81 mg total) by mouth daily. For 1 month then stop. 30 tablet 0  . atorvastatin (LIPITOR) 80 MG tablet Take 1 tablet (80 mg total) by mouth daily. 90 tablet 3  . carvedilol (COREG) 3.125 MG tablet Take 1 tablet (3.125 mg total) by mouth 2 (two) times daily with a meal. 60 tablet 1  .  clopidogrel (PLAVIX) 75 MG tablet Take 1 tablet (75 mg total) by mouth daily. 30 tablet 1  . enoxaparin (LOVENOX) 100 MG/ML injection Inject 1 mL (100 mg total) into the skin every 12 (twelve) hours. 60 Syringe 1  . escitalopram (LEXAPRO) 20 MG tablet Take 1 tablet (20 mg total) by mouth daily. 90 tablet 2  . furosemide (LASIX) 40 MG tablet Take 1 tablet (40 mg total) by mouth daily. 30 tablet 1  . losartan (COZAAR) 25 MG tablet Take 0.5 tablets (12.5 mg total) by mouth 2 (two) times daily. 30 tablet 1  . nitroGLYCERIN (NITROSTAT) 0.4 MG SL tablet Place 1  tablet (0.4 mg total) under the tongue every 5 (five) minutes as needed for chest pain (up to 3 doses). 25 tablet 3  . pantoprazole (PROTONIX) 40 MG tablet Take 1 tablet (40 mg total) by mouth daily. 30 tablet 1  . spironolactone (ALDACTONE) 25 MG tablet Take 0.5 tablets (12.5 mg total) by mouth daily. 30 tablet 1  . acetaminophen (TYLENOL) 650 MG CR tablet Take 650 mg by mouth every 6 (six) hours as needed for pain.     No facility-administered medications prior to visit.      Allergies:   Patient has no known allergies.   Social History   Social History  . Marital status: Married    Spouse name: N/A  . Number of children: N/A  . Years of education: N/A   Social History Main Topics  . Smoking status: Never Smoker  . Smokeless tobacco: Never Used  . Alcohol use 0.0 oz/week     Comment: occasional  . Drug use: No  . Sexual activity: Yes    Birth control/ protection: Post-menopausal     Comment: Married for 16 years   Other Topics Concern  . None   Social History Narrative   Epworth Sleepiness Scale = 12 (as of 09/09/2015)     Family History:  The patient's family history includes AAA (abdominal aortic aneurysm) in his mother; COPD in his mother; Depression in his father; Diabetes in his brother and father; Early death in his brother; Heart disease in his brother, brother, father, maternal grandfather, maternal grandmother, and mother; Stroke in his paternal grandmother.   ROS:   Please see the history of present illness.    Fatigue but no orthopnea or PND. Denies bleeding, sudden dyspnea, or new neurological complaints. All other systems reviewed and are negative.   PHYSICAL EXAM:   VS:  BP 100/74 (BP Location: Left Arm)   Pulse 72   Ht 6\' 1"  (1.854 m)   Wt 232 lb 12.8 oz (105.6 kg)   BMI 30.71 kg/m    GEN: Well nourished, well developed, in no acute distress  HEENT: normal  Neck: no JVD, carotid bruits, or masses Cardiac: RRR; no murmurs, rubs, or gallops,no edema    Respiratory:  clear to auscultation bilaterally, normal work of breathing GI: soft, nontender, nondistended, + BS MS: no deformity or atrophy  Skin: warm and dry, no rash Neuro:  Alert and Oriented x 3, Strength and sensation are intact Psych: euthymic mood, full affect  Wt Readings from Last 3 Encounters:  10/24/16 232 lb 12.8 oz (105.6 kg)  10/11/16 235 lb (106.6 kg)  10/07/16 235 lb (106.6 kg)      Studies/Labs Reviewed:   EKG:  EKG Normal sinus rhythm, inferior infarct, anterior infarct, persistent ST elevation. When compared to prior tracings, right bundle branch block and left anterior hemiblock or unchanged.  Recent Labs: 10/15/2016: ALT 32; BUN 22; Creatinine, Ser 0.98; Potassium 3.6; Sodium 132 10/17/2016: Hemoglobin 9.5; Platelets 178   Lipid Panel    Component Value Date/Time   CHOL 131 10/11/2016 0339   CHOL 134 07/25/2016 0850   TRIG 131 10/11/2016 0339   HDL 37 (L) 10/11/2016 0339   HDL 41 07/25/2016 0850   CHOLHDL 3.5 10/11/2016 0339   VLDL 26 10/11/2016 0339   LDLCALC 68 10/11/2016 0339   LDLCALC 73 07/25/2016 0850    Additional studies/ records that were reviewed today include:   Coronary angiogram 10/11/16: Diagnostic Diagram     Post-Intervention Diagram       ECHOCARDIOGRAM 10/17/16: Study Conclusions  - Left ventricle: Septal apical and inferior wall hypokinesis The   cavity size was moderately dilated. Wall thickness was normal.   Systolic function was moderately reduced. The estimated ejection   fraction was in the range of 35% to 40%. Doppler parameters are   consistent with both elevated ventricular end-diastolic filling   pressure and elevated left atrial filling pressure. - Aortic valve: There was mild regurgitation. - Mitral valve: There was mild regurgitation. - Atrial septum: No defect or patent foramen ovale was identified.   ASSESSMENT:    1. CAD S/P percutaneous coronary angioplasty   2. Essential hypertension   3.  Ischemic cardiomyopathy   4. Other acute pulmonary embolism without acute cor pulmonale ([place])   5. Acute systolic heart failure ([place])   6. Lymphadenopathy, abdominal   7. 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 Hyperlipidemia LDL goal <130      PLAN:  In order of problems listed above: 1. Discontinue aspirin after one month of therapy. At that point continue Plavix and Lovenox subcutaneous. Return in 6-8 weeks to definitively plan for biopsy which I encourage to be done  6 months after stenting. They are insisting upon earlier time frame. We have therefore recommended at least 3 months of antiplatelet therapy prior to brief discontinuation for tissue diagnosis. Will need to determine site and procedure required to obtain tissue. 2. Increase carvedilol to 6.25 mg twice a day. 3. Decrease furosemide to 20 mg per day and increase Carvedilol to 6.25 mg BID. Basic metabolic panel in [id number] days. 4. Continue Lovenox. 5. Optimize heart failure therapy. Currently taken 50 mg of losartan. Increase carvedilol to 6.25 mg twice a day. 6. Will make arrangements for biopsy to be done 3 months after LAD stenting. Final arrangements will be made for this when I see them back in 2 months.   Medication Adjustments/Labs and Tests Ordered: Current medicines are reviewed at length with the patient today.  Concerns regarding medicines are outlined above.  Medication changes, Labs and Tests ordered today are listed in the Patient Instructions below. There are no Patient Instructions on file for this visit.   Signed, Sinclair Grooms, MD  10/24/2016 9:47 AM    [facility] [place], [place], [place]  [address] Phone: [phone]; Fax: [phone]

## 2016-10-24 NOTE — Telephone Encounter (Signed)
Called the patient and left a message for him to call back

## 2016-10-24 NOTE — Patient Instructions (Signed)
Medication Instructions:  1) DISCONTINUE Aspirin on 10/18/16 2) INCREASE Carvedilol to 6.25mg  twice daily 3) DECREASE Furosemide to 20mg  once daily  Labwork: Your physician recommends that you return for lab work in: 1 week (BMET).  This can be done at the [facility] in [place].    Testing/Procedures: None  Follow-Up: Your physician recommends that you schedule a follow-up appointment in: 6-8 weeks with Dr. Tamala Julian.    Any Other Special Instructions Will Be Listed Below (If Applicable).     If you need a refill on your cardiac medications before your next appointment, please call your pharmacy.

## 2016-10-24 NOTE — Telephone Encounter (Signed)
-----   Message from Charlie Pitter, [place] sent at 10/19/2016  4:55 PM EST ----- Regarding: RE: Please arrange outpatient sleep study Can you make sure pt is aware?thanks! ----- Message ----- From: Freada Bergeron, CMA Sent: 10/19/2016   4:37 PM To: Charlie Pitter, PA-C Subject: FW: Please arrange outpatient sleep study      Called the sleep lab and Coralyn Mark says this patient is going to [facility] for a home sleep study scheduled for 10/30/16. 10/19/16. Thanks  ----- Message ----- From: Armando Gang Sent: 10/17/2016   4:33 PM To: Charlie Pitter, PA-C, Freada Bergeron, CMA, # Subject: RE: Please arrange outpatient sleep study       [place]  - this message was forward to Jaclyn Prime Gae Bon) to set up sleep study.     ----- Message ----- From: Charlie Pitter, PA-C Sent: 10/17/2016   1:26 PM To: [facility]y [facility] Subject: Please arrange outpatient sleep study          For nocturnal hypoxia. Recent complex discharge. Pt lives in [place] area. Thanks. Dayna Dunn PA-C

## 2016-10-26 NOTE — Telephone Encounter (Signed)
Okay with me 

## 2016-10-26 NOTE — Telephone Encounter (Signed)
No problem.

## 2016-10-30 ENCOUNTER — Ambulatory Visit: Payer: Medicare Other | Attending: Physician Assistant | Admitting: Neurology

## 2016-10-30 DIAGNOSIS — R4701 Aphasia: Secondary | ICD-10-CM | POA: Diagnosis not present

## 2016-10-30 DIAGNOSIS — G4733 Obstructive sleep apnea (adult) (pediatric): Secondary | ICD-10-CM | POA: Diagnosis not present

## 2016-10-30 DIAGNOSIS — R569 Unspecified convulsions: Secondary | ICD-10-CM | POA: Diagnosis not present

## 2016-10-30 DIAGNOSIS — G4734 Idiopathic sleep related nonobstructive alveolar hypoventilation: Secondary | ICD-10-CM | POA: Diagnosis present

## 2016-10-30 DIAGNOSIS — R4182 Altered mental status, unspecified: Secondary | ICD-10-CM | POA: Diagnosis not present

## 2016-11-01 ENCOUNTER — Telehealth: Payer: Self-pay | Admitting: *Deleted

## 2016-11-01 NOTE — Telephone Encounter (Signed)
Per staff message, called patient mobile/home and wife's cell. No answer/busy. Left voicemail to call back [place].

## 2016-11-01 NOTE — Telephone Encounter (Signed)
-----   Message from Arty Baumgartner, RN sent at 10/31/2016 10:35 AM EDT ----- Just saw this one too... ----- Message ----- From: Brunetta Genera, MD Sent: 10/31/2016   1:31 AM To: Belva Crome, MD, Skeet Latch, MD, #  Loren, Could you plz call the patient/his wife and figure out where they intend to followup for their oncology cares. He was being seen by Dr Maylon Peppers at [facility] prior to recent hospitalization when I was consulted. I still he/his wife need to have a followup discussion regarding goals of care. It is very possible gastric CA or GIST among other possibilities - all of these possibilities have a good chance of causing significant complications in a short time period much less than 3 months. Waiting for 24months is unfortunately going to likely place his in a hospice kind of situation which many of these possibilities. He/Wife needs to have a followup with either Dr Maylon Peppers at [facility] or I in the next 2-3 weeks. Thanks Sullivan Lone MD MS  ----- Message ----- From: Belva Crome, MD Sent: 10/24/2016  12:49 PM To: Skeet Latch, MD, Fransisca Kaufmann Dettinger, MD, #  Please keep me posted on what tissue will be biopsied and by what approach. With this information, I can recommend how to go about pausing antiplatelet therapy.  I have advised waiting at least 3 months from stent implant before biopsy. Therefore, target timeframe>/= late May 2018.  HS

## 2016-11-02 ENCOUNTER — Telehealth: Payer: Self-pay | Admitting: Hematology

## 2016-11-02 NOTE — Telephone Encounter (Signed)
Wife called to follow up on voicemail.Rn was unable to answer let patient know a message will be left for RN to follow up.

## 2016-11-02 NOTE — Telephone Encounter (Signed)
Patient has appointment scheduled with Dr Tamala Julian

## 2016-11-03 ENCOUNTER — Ambulatory Visit: Payer: Medicare Other | Admitting: Internal Medicine

## 2016-11-04 ENCOUNTER — Ambulatory Visit (HOSPITAL_COMMUNITY): Payer: Medicare Other

## 2016-11-05 ENCOUNTER — Telehealth: Payer: Self-pay | Admitting: Hematology

## 2016-11-05 NOTE — Telephone Encounter (Signed)
Spoke with spouse re lab/fu 3/28. Spouse given date/time and office location.

## 2016-11-07 ENCOUNTER — Telehealth: Payer: Self-pay | Admitting: *Deleted

## 2016-11-07 NOTE — Telephone Encounter (Signed)
Wife called stating that recently patient has developed pin hole scabs on both legs that results to bleeding. Denies pain or any other symptoms associated with this. Informed family member that MD [name] is not in the office this week, but if any worsening symptoms occur, they should go to the ED and/or contact PCP. Patient verbalized understanding.

## 2016-11-08 NOTE — Progress Notes (Signed)
[facility] Taelor Moncada A. Tonye Tancredi, MD     [email or web address]             NOCTURNAL POLYSOMNOGRAPHY   LOCATION: [facility]  Patient Name: [name], [name] Study Date: 10/30/2016 Gender: Male D.O.B: 11/22/1950 Age (years): 65 Referring Provider: Dayna Dunn Height (inches): 73 Interpreting Physician: Colene Mines MD, ABSM Weight (lbs): 232 RPSGT: Hedrick, Debra BMI: 31 MRN: [id number] Neck Size: 17.00 CLINICAL INFORMATION Sleep Study Type: Split Night CPAP  Indication for sleep study: N/A  Epworth Sleepiness Score: 5  SLEEP STUDY TECHNIQUE As per the AASM Manual for the Scoring of Sleep and Associated Events v2.3 (April 2016) with a hypopnea requiring 4% desaturations.  The channels recorded and monitored were frontal, central and occipital EEG, electrooculogram (EOG), submentalis EMG (chin), nasal and oral airflow, thoracic and abdominal wall motion, anterior tibialis EMG, snore microphone, electrocardiogram, and pulse oximetry. Continuous positive airway pressure (CPAP) was initiated when the patient met split night criteria and was titrated according to treat sleep-disordered breathing.  MEDICATIONS Medications self-administered by patient taken the night of the study : N/A  Current Outpatient Prescriptions:  .  atorvastatin (LIPITOR) 80 MG tablet, Take 1 tablet (80 mg total) by mouth daily., Disp: 90 tablet, Rfl: 3 .  carvedilol (COREG) 6.25 MG tablet, Take 1 tablet (6.25 mg total) by mouth 2 (two) times daily., Disp: 180 tablet, Rfl: 3 .  clopidogrel (PLAVIX) 75 MG tablet, Take 1 tablet (75 mg total) by mouth daily., Disp: 30 tablet, Rfl: 1 .  enoxaparin (LOVENOX) 100 MG/ML injection, Inject 1 mL (100 mg total) into the skin every 12 (twelve) hours., Disp: 60 Syringe, Rfl: 1 .  escitalopram (LEXAPRO) 20 MG tablet, Take 1 tablet (20 mg total) by mouth daily., Disp: 90 tablet, Rfl: 2 .  furosemide (LASIX) 20 MG tablet, Take 1 tablet (20 mg total) by mouth daily.,  Disp: 90 tablet, Rfl: 3 .  losartan (COZAAR) 25 MG tablet, Take 0.5 tablets (12.5 mg total) by mouth 2 (two) times daily., Disp: 30 tablet, Rfl: 1 .  nitroGLYCERIN (NITROSTAT) 0.4 MG SL tablet, Place 1 tablet (0.4 mg total) under the tongue every 5 (five) minutes as needed for chest pain (up to 3 doses)., Disp: 25 tablet, Rfl: 3 .  pantoprazole (PROTONIX) 40 MG tablet, Take 1 tablet (40 mg total) by mouth daily., Disp: 30 tablet, Rfl: 1 .  spironolactone (ALDACTONE) 25 MG tablet, Take 0.5 tablets (12.5 mg total) by mouth daily., Disp: 30 tablet, Rfl: 1   RESPIRATORY PARAMETERS Diagnostic  Total AHI (/hr): 69.5 RDI (/hr): 71.3 OA Index (/hr): 20.8 CA Index (/hr): 0.0 REM AHI (/hr): 73.2 NREM AHI (/hr): 69.1 Supine AHI (/hr): 69.5 Non-supine AHI (/hr): N/A Min O2 Sat (%): 78.00 Mean O2 (%): 92.51 Time below 88% (min): 27.8   Titration  Optimal Pressure (cm): 12 AHI at Optimal Pressure (/hr): 1.5 Min O2 at Optimal Pressure (%): 90.0 Supine % at Optimal (%): 43 Sleep % at Optimal (%): 98   SLEEP ARCHITECTURE The recording time for the entire night was 442.6 minutes.  During a baseline period of 229.6 minutes, the patient slept for 199.5 minutes in REM and nonREM, yielding a sleep efficiency of 86.9%. Sleep onset after lights out was 6.2 minutes with a REM latency of 162.5 minutes. The patient spent 1.75% of the night in stage N1 sleep, 74.69% in stage N2 sleep, 13.28% in stage N3 and 10.28% in REM.  During the titration period of 209.7 minutes, the patient slept for 197.4   minutes in REM and nonREM, yielding a sleep efficiency of 94.2%. Sleep onset after CPAP initiation was 7.7 minutes with a REM latency of 114.0 minutes. The patient spent 1.77% of the night in stage N1 sleep, 74.93% in stage N2 sleep, 7.09% in stage N3 and 16.21% in REM.  CARDIAC DATA The 2 lead EKG demonstrated sinus rhythm. The mean heart rate was N/A beats per minute. Other EKG findings include: PVCs. LEG MOVEMENT DATA The  total Periodic Limb Movements of Sleep (PLMS) were 0. The PLMS index was 0.00.  IMPRESSIONS - Severe obstructive sleep apnea occurred during the diagnostic portion of the study (AHI = 69.5/hour). An optimal CPAP 12 was selected for this patient ( 12 cm of water).       Gustavo Dispenza A Wiletta Bermingham, MD Diplomate, American Board of Sleep Medicine.  

## 2016-11-08 NOTE — Procedures (Signed)
Donald A. [name], MD     [email or web address]             NOCTURNAL POLYSOMNOGRAPHY   LOCATION: [facility]  Patient Name: [name], [name] Date: 10/30/2016 Gender: Male D.O.B: 04-17-1951 Age (years): 65 Referring Provider: Melina Copa Height (inches): 73 Interpreting Physician: Phillips Odor MD, ABSM Weight (lbs): 232 RPSGT: Rosebud Poles BMI: 31 MRN: [id number] Neck Size: 17.00 CLINICAL INFORMATION Sleep Study Type: Split Night CPAP  Indication for sleep study: N/A  Epworth Sleepiness Score: 5  SLEEP STUDY TECHNIQUE As per the AASM Manual for the Scoring of Sleep and Associated Events v2.3 (April 2016) with a hypopnea requiring 4% desaturations.  The channels recorded and monitored were frontal, central and occipital EEG, electrooculogram (EOG), submentalis EMG (chin), nasal and oral airflow, thoracic and abdominal wall motion, anterior tibialis EMG, snore microphone, electrocardiogram, and pulse oximetry. Continuous positive airway pressure (CPAP) was initiated when the patient met split night criteria and was titrated according to treat sleep-disordered breathing.  MEDICATIONS Medications self-administered by patient taken the night of the study : N/A  Current Outpatient Prescriptions:  .  atorvastatin (LIPITOR) 80 MG tablet, Take 1 tablet (80 mg total) by mouth daily., Disp: 90 tablet, Rfl: 3 .  carvedilol (COREG) 6.25 MG tablet, Take 1 tablet (6.25 mg total) by mouth 2 (two) times daily., Disp: 180 tablet, Rfl: 3 .  clopidogrel (PLAVIX) 75 MG tablet, Take 1 tablet (75 mg total) by mouth daily., Disp: 30 tablet, Rfl: 1 .  enoxaparin (LOVENOX) 100 MG/ML injection, Inject 1 mL (100 mg total) into the skin every 12 (twelve) hours., Disp: 60 Syringe, Rfl: 1 .  escitalopram (LEXAPRO) 20 MG tablet, Take 1 tablet (20 mg total) by mouth daily., Disp: 90 tablet, Rfl: 2 .  furosemide (LASIX) 20 MG tablet, Take 1 tablet (20 mg total) by mouth daily.,  Disp: 90 tablet, Rfl: 3 .  losartan (COZAAR) 25 MG tablet, Take 0.5 tablets (12.5 mg total) by mouth 2 (two) times daily., Disp: 30 tablet, Rfl: 1 .  nitroGLYCERIN (NITROSTAT) 0.4 MG SL tablet, Place 1 tablet (0.4 mg total) under the tongue every 5 (five) minutes as needed for chest pain (up to 3 doses)., Disp: 25 tablet, Rfl: 3 .  pantoprazole (PROTONIX) 40 MG tablet, Take 1 tablet (40 mg total) by mouth daily., Disp: 30 tablet, Rfl: 1 .  spironolactone (ALDACTONE) 25 MG tablet, Take 0.5 tablets (12.5 mg total) by mouth daily., Disp: 30 tablet, Rfl: 1   RESPIRATORY PARAMETERS Diagnostic  Total AHI (/hr): 69.5 RDI (/hr): 71.3 OA Index (/hr): 20.8 CA Index (/hr): 0.0 REM AHI (/hr): 73.2 NREM AHI (/hr): 69.1 Supine AHI (/hr): 69.5 Non-supine AHI (/hr): N/A Min O2 Sat (%): 78.00 Mean O2 (%): 92.51 Time below 88% (min): 27.8   Titration  Optimal Pressure (cm): 12 AHI at Optimal Pressure (/hr): 1.5 Min O2 at Optimal Pressure (%): 90.0 Supine % at Optimal (%): 43 Sleep % at Optimal (%): 98   SLEEP ARCHITECTURE The recording time for the entire night was 442.6 minutes.  During a baseline period of 229.6 minutes, the patient slept for 199.5 minutes in REM and nonREM, yielding a sleep efficiency of 86.9%. Sleep onset after lights out was 6.2 minutes with a REM latency of 162.5 minutes. The patient spent 1.75% of the night in stage N1 sleep, 74.69% in stage N2 sleep, 13.28% in stage N3 and 10.28% in REM.  During the titration period of 209.7 minutes, the patient slept for 197.4  minutes in REM and nonREM, yielding a sleep efficiency of 94.2%. Sleep onset after CPAP initiation was 7.7 minutes with a REM latency of 114.0 minutes. The patient spent 1.77% of the night in stage N1 sleep, 74.93% in stage N2 sleep, 7.09% in stage N3 and 16.21% in REM.  CARDIAC DATA The 2 lead EKG demonstrated sinus rhythm. The mean heart rate was N/A beats per minute. Other EKG findings include: PVCs. LEG MOVEMENT DATA The  total Periodic Limb Movements of Sleep (PLMS) were 0. The PLMS index was 0.00.  IMPRESSIONS - Severe obstructive sleep apnea occurred during the diagnostic portion of the study (AHI = 69.5/hour). An optimal CPAP 12 was selected for this patient ( 12 cm of water).       Delano Metz, MD Diplomate, American Board of Sleep Medicine.

## 2016-11-15 ENCOUNTER — Ambulatory Visit: Payer: Medicare Other | Admitting: Cardiovascular Disease

## 2016-11-16 ENCOUNTER — Encounter: Payer: Self-pay | Admitting: Hematology

## 2016-11-16 ENCOUNTER — Telehealth: Payer: Self-pay | Admitting: Hematology

## 2016-11-16 ENCOUNTER — Ambulatory Visit (HOSPITAL_BASED_OUTPATIENT_CLINIC_OR_DEPARTMENT_OTHER): Payer: Medicare Other | Admitting: Hematology

## 2016-11-16 ENCOUNTER — Ambulatory Visit (HOSPITAL_BASED_OUTPATIENT_CLINIC_OR_DEPARTMENT_OTHER): Payer: Medicare Other

## 2016-11-16 ENCOUNTER — Other Ambulatory Visit: Payer: Self-pay | Admitting: *Deleted

## 2016-11-16 ENCOUNTER — Other Ambulatory Visit (HOSPITAL_BASED_OUTPATIENT_CLINIC_OR_DEPARTMENT_OTHER): Payer: Medicare Other

## 2016-11-16 VITALS — BP 103/67 | HR 73 | Temp 98.5°F | Resp 18 | Ht 72.0 in | Wt 231.2 lb

## 2016-11-16 DIAGNOSIS — D4989 Neoplasm of unspecified behavior of other specified sites: Secondary | ICD-10-CM

## 2016-11-16 DIAGNOSIS — I2699 Other pulmonary embolism without acute cor pulmonale: Secondary | ICD-10-CM | POA: Diagnosis not present

## 2016-11-16 DIAGNOSIS — R591 Generalized enlarged lymph nodes: Secondary | ICD-10-CM

## 2016-11-16 DIAGNOSIS — R590 Localized enlarged lymph nodes: Secondary | ICD-10-CM

## 2016-11-16 DIAGNOSIS — R978 Other abnormal tumor markers: Secondary | ICD-10-CM

## 2016-11-16 LAB — CBC & DIFF AND RETIC
BASO%: 0.2 % (ref 0.0–2.0)
Basophils Absolute: 0 10*3/uL (ref 0.0–0.1)
EOS ABS: 0.1 10*3/uL (ref 0.0–0.5)
EOS%: 2 % (ref 0.0–7.0)
HEMATOCRIT: 37.2 % — AB (ref 38.4–49.9)
HEMOGLOBIN: 12 g/dL — AB (ref 13.0–17.1)
IMMATURE RETIC FRACT: 7 % (ref 3.00–10.60)
LYMPH%: 25.3 % (ref 14.0–49.0)
MCH: 28.4 pg (ref 27.2–33.4)
MCHC: 32.3 g/dL (ref 32.0–36.0)
MCV: 88.2 fL (ref 79.3–98.0)
MONO#: 0.5 10*3/uL (ref 0.1–0.9)
MONO%: 12.3 % (ref 0.0–14.0)
NEUT#: 2.5 10*3/uL (ref 1.5–6.5)
NEUT%: 60.2 % (ref 39.0–75.0)
Platelets: 185 10*3/uL (ref 140–400)
RBC: 4.22 10*6/uL (ref 4.20–5.82)
RDW: 14.6 % (ref 11.0–14.6)
Retic %: 1.75 % (ref 0.80–1.80)
Retic Ct Abs: 73.85 10*3/uL (ref 34.80–93.90)
WBC: 4.1 10*3/uL (ref 4.0–10.3)
lymph#: 1 10*3/uL (ref 0.9–3.3)

## 2016-11-16 LAB — COMPREHENSIVE METABOLIC PANEL
ALBUMIN: 3.7 g/dL (ref 3.5–5.0)
ALT: 30 U/L (ref 0–55)
AST: 19 U/L (ref 5–34)
Alkaline Phosphatase: 116 U/L (ref 40–150)
Anion Gap: 8 mEq/L (ref 3–11)
BILIRUBIN TOTAL: 0.56 mg/dL (ref 0.20–1.20)
BUN: 12.6 mg/dL (ref 7.0–26.0)
CALCIUM: 10.1 mg/dL (ref 8.4–10.4)
CO2: 25 mEq/L (ref 22–29)
Chloride: 104 mEq/L (ref 98–109)
Creatinine: 0.8 mg/dL (ref 0.7–1.3)
GLUCOSE: 97 mg/dL (ref 70–140)
POTASSIUM: 3.9 meq/L (ref 3.5–5.1)
Sodium: 138 mEq/L (ref 136–145)
TOTAL PROTEIN: 6.6 g/dL (ref 6.4–8.3)

## 2016-11-16 LAB — CEA (IN HOUSE-CHCC): CEA (CHCC-IN HOUSE): 78.96 ng/mL — AB (ref 0.00–5.00)

## 2016-11-16 LAB — LACTATE DEHYDROGENASE: LDH: 200 U/L (ref 125–245)

## 2016-11-16 NOTE — Telephone Encounter (Signed)
Gave patient AVS and calender per 11/16/2016 los. [facility] to contact patient with CT schedule.  

## 2016-11-17 LAB — CANCER ANTIGEN 19-9

## 2016-11-18 NOTE — Progress Notes (Signed)
[name]    HEMATOLOGY/ONCOLOGY CONSULTATION NOTE  Date of Service: 11/18/2016  Patient Care Team: Worthy Rancher, MD as PCP - General (Family Medicine) Dr Daneen Schick MD (Cardiology)  CHIEF COMPLAINTS/PURPOSE OF CONSULTATION:  Extensive b/l lower extremity DVT and b/l large volume pulmonary embolism Extensive embolic CVA's Abdominal LNadenopathy and stomach lesions concerning for malignancy s/p Acute STEMI s/p stenting. (10/11/2016)   HISTORY OF PRESENTING ILLNESS:   [name] is a wonderful 66 y.o. male with a very complicated medical presentation. He had apparently presented with b/l extensive DVT and pulmonary embolism on 09/10/2016 and was started on Xarelto with loading dose followed standard once daily therapeutic dosing.  He notes his breathing was improving and he was not on oxygen at home . He subsequently presented to the ED with confusion and AMS for 1-2 days and had imaging which showed Multiple embolic infarctions scattered throughout the cerebellum and both cerebral hemispheres consistent with embolic disease from the heart or ascending aorta. Most extensive in the region of involvement is within the left MCA territory where there appears to be a missing left M2 branch. No large confluent infarction however. No hemorrhage, swelling or mass effect.  Patient had a CT angiogram of Chest and Abd which upper abdominal lesions and then he had a dedicated CT abd which showed Abdominal lymphadenopathy in retroperitoneum, porta hepatis,gastrohepatic and gastrosplenic ligaments, and inferior mediastinum. Differential diagnosis includes metastatic disease and lymphoma. Mildly enlarged prostate, with focal contrast enhancement in the right anterior mid gland which may be due to prostatitis or prostate carcinoma. No pelvic lymphadenopathy identified. PSA levels WNL  His Xarelto was changed to Eliquis 5mg  po BID. Patient and his wife note that his CVA symptoms were improving and that  his verbal recall was getting better. He still notes issues with balance (probably from cerebellar involvement).  He was on Eliquis and the abdominal LNadenopathy was not easy to biopsy so he had a PET/CT to further evaluate the disease. PET/CT was done on 10/10/2016 and showed Two FDG avid foci within the wall of the stomach along the posterior aspect of the greater curvature. These are fairly FDG avid SUV 12.9. No overt discrete lesions on CT. Hypermetabolic gastrohepatic ligament lymph nodes, gastrosplenic lymph nodes, porta hepatis lymph nodes and distal paraesophageal lymph nodes consistent with metastatic lymph nodes.  Patient unfortunate presented 08/10/2017 with a STEMI with troponin >65 and extensive antero-apical wall motion abnormality and some heart failure symptomatology. He has since had DES stenting of his mid LAD with a Synergy stent.  Patient did well after his cardiac intervention and was discharged home. He notes that over the last month or so he has done well at home with progressive ambulation and minimal symptomatology remaining from his CVA. No chest pain or shortness of breath no angina has not needed any nitroglycerin. Has some indigestion and dyspeptic symptoms but no overt abdominal pain or distention. 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  He notes that he will be starting cardiac rehabilitation from 11/23/2016 2-3 times a week. He reports that his Coreg was recently increased to optimize his treatment for CAD.  He continues to be on Lovenox twice a day as anticoagulation as per our recommendations. No issues with overt bleeding.  His hypercoagulability workup was unrevealing and negative for antiphospholipid antibodies, negative for factor V Leiden Mutation and prothrombin gene mutation.  Cardiology previously noted that they would recommend drug antiplatelet therapy  for at least 3 months prior to any biopsies. He  is now off aspirin from 11/15/2016 as per cardiology recommendations.  He also had a sleep study on 10/30/2016 that showed severe obstructive sleep apnea with an optimal CPAP setting of 12cms of H20 recommended by Dr Trey Sailors.   MEDICAL HISTORY:  Past Medical History:  Diagnosis Date  . CAD in native artery    a. STEMI 09/2016 s/p DES to mLAD, residual 65-75% OM1 disease treated medically. EF 30-35% by cath, 35-40% by echo at dc.  . Chronic systolic CHF (congestive heart failure) ([place])    a. Dx 09/2016 - EF 30-35% by cath, 35-40% by echo.  . Depression   . Dilatation of aorta ([place])    a. CT 09/2016: dilation of the ascending thoracic aorta measuring 4.4 cm in maximum diameter, recommend f/u 2021 if appropriate.  [name] DVT (deep venous thrombosis) ([place])    a. Bilat DVT noted 08/2016.  . Essential hypertension   . GERD (gastroesophageal reflux disease)   . 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 History of stroke    a. 09/2016.  [name] Hypercoagulable state ([place])   . Hyperlipidemia   . Hyponatremia   . Ischemic cardiomyopathy   . Lymphadenopathy    a. Abd lymphadenopathy concerning for malignancy noted in 09/2016.  [name] Normocytic anemia   . PE (pulmonary thromboembolism) ([facility] [place])    a. Bilat PE 08/2016.  . Stroke ([place])   . Suspected sleep apnea     SURGICAL HISTORY: Past Surgical History:  Procedure Laterality Date  . adnoidectomy    . APPENDECTOMY    . CORONARY STENT INTERVENTION N/A 10/11/2016   Procedure: Coronary Stent Intervention;  Surgeon: Belva Crome, MD;  Location: [facility];  Service: Cardiovascular;  Laterality: N/A;  . LEFT HEART CATH AND CORONARY ANGIOGRAPHY N/A 10/11/2016   Procedure: Left Heart Cath and Coronary Angiography;  Surgeon: Belva Crome, MD;  Location: [facility];  Service: Cardiovascular;  Laterality: N/A;  . PROSTATE SURGERY    . TONSILLECTOMY AND ADENOIDECTOMY      SOCIAL HISTORY: Social History   Social History  . Marital status: Married     Spouse name: N/A  . Number of children: N/A  . Years of education: N/A   Occupational History  . Not on file.   Social History Main Topics  . Smoking status: Never Smoker  . Smokeless tobacco: Never Used  . Alcohol use 0.0 oz/week     Comment: occasional  . Drug use: No  . Sexual activity: Yes    Birth control/ protection: Post-menopausal     Comment: Married for 16 years   Other Topics Concern  . Not on file   Social History Narrative   Epworth Sleepiness Scale = 12 (as of 09/09/2015)    FAMILY HISTORY: Family History  Problem Relation Age of Onset  . COPD Mother   . Heart disease Mother   . AAA (abdominal aortic aneurysm) Mother   . Heart disease Father   . Depression Father   . Diabetes Father   . Heart disease Brother   . Early death Brother     heart attack  . Heart disease Maternal Grandmother   . Heart disease Maternal Grandfather   . Stroke Paternal Grandmother   . Heart disease Brother   . Diabetes Brother     ALLERGIES:  has No Known Allergies.  MEDICATIONS:  Current Outpatient Prescriptions  Medication Sig  Dispense Refill  . atorvastatin (LIPITOR) 80 MG tablet Take 1 tablet (80 mg total) by mouth daily. 90 tablet 3  . carvedilol (COREG) 6.25 MG tablet Take 1 tablet (6.25 mg total) by mouth 2 (two) times daily. 180 tablet 3  . clopidogrel (PLAVIX) 75 MG tablet Take 1 tablet (75 mg total) by mouth daily. 30 tablet 1  . enoxaparin (LOVENOX) 100 MG/ML injection Inject 1 mL (100 mg total) into the skin every 12 (twelve) hours. 60 Syringe 1  . escitalopram (LEXAPRO) 20 MG tablet Take 1 tablet (20 mg total) by mouth daily. 90 tablet 2  . furosemide (LASIX) 20 MG tablet Take 1 tablet (20 mg total) by mouth daily. 90 tablet 3  . losartan (COZAAR) 25 MG tablet Take 0.5 tablets (12.5 mg total) by mouth 2 (two) times daily. 30 tablet 1  . nitroGLYCERIN (NITROSTAT) 0.4 MG SL tablet Place 1 tablet (0.4 mg total) under the tongue every 5 (five) minutes as needed for  chest pain (up to 3 doses). 25 tablet 3  . pantoprazole (PROTONIX) 40 MG tablet Take 1 tablet (40 mg total) by mouth daily. 30 tablet 1  . spironolactone (ALDACTONE) 25 MG tablet Take 0.5 tablets (12.5 mg total) by mouth daily. 30 tablet 1   No current facility-administered medications for this visit.     REVIEW OF SYSTEMS:    10 Point review of Systems was done is negative except as noted above.  PHYSICAL EXAMINATION: ECOG PERFORMANCE STATUS: 2 - Symptomatic, <50% confined to bed  . Vitals:   11/16/16 1031  BP: 103/67  Pulse: 73  Resp: 18  Temp: 98.5 F (36.9 C)   Filed Weights   11/16/16 1031  Weight: 231 lb 3.2 oz (104.9 kg)   .Body mass index is 31.36 kg/m.  GENERAL:alert, in no acute distress and comfortable SKIN: no acute rashes, no significant lesions EYES: conjunctiva are pink and non-injected, sclera anicteric OROPHARYNX: MMM, no exudates, no oropharyngeal erythema or ulceration NECK: supple, no JVD LYMPH:  no palpable lymphadenopathy in the cervical, axillary or inguinal regions LUNGS: clear to auscultation b/l with normal respiratory effort HEART: regular rate & rhythm ABDOMEN:  normoactive bowel sounds , non tender, not distended. Extremity: no pedal edema PSYCH: alert & oriented x 3 with fluent speech NEURO: no focal motor/sensory deficits  LABORATORY DATA:  I have reviewed the data as listed  . CBC Latest Ref Rng & Units 11/16/2016 10/17/2016 10/15/2016  WBC 4.0 - 10.3 10e3/uL 4.1 5.5 6.8  Hemoglobin 13.0 - 17.1 g/dL 12.0(L) 9.5(L) 9.7(L)  Hematocrit 38.4 - 49.9 % 37.2(L) 29.0(L) 29.4(L)  Platelets 140 - 400 10e3/uL 185 178 165    . CMP Latest Ref Rng & Units 11/16/2016 10/15/2016 10/14/2016  Glucose 70 - 140 mg/dl 97 118(H) 124(H)  BUN 7.0 - 26.0 mg/dL 12.6 22(H) 23(H)  Creatinine 0.7 - 1.3 mg/dL 0.8 0.98 1.05  Sodium 136 - 145 mEq/L 138 132(L) 133(L)  Potassium 3.5 - 5.1 mEq/L 3.9 3.6 4.0  Chloride 101 - 111 mmol/L - 94(L) 96(L)  CO2 22 - 29  mEq/L 25 28 29   Calcium 8.4 - 10.4 mg/dL 10.1 9.1 9.4  Total Protein 6.4 - 8.3 g/dL 6.6 5.5(L) -  Total Bilirubin 0.20 - 1.20 mg/dL 0.56 1.4(H) -  Alkaline Phos 40 - 150 U/L 116 76 -  AST 5 - 34 U/L 19 35 -  ALT 0 - 55 U/L 30 32 -   Component     Latest Ref Rng &  Units 10/11/2016  CEA     0.0 - 4.7 ng/mL 41.6 (H)  CA 19-9     0 - 35 U/mL 1,990 (H)  AFP Tumor Marker     0.0 - 8.3 ng/mL 1.9   Component     Latest Ref Rng & Units 09/27/2016 09/29/2016 10/03/2016 10/11/2016  Vitamin B12     180 - 914 pg/mL 1,344 (H)     HIV     Non Reactive Non Reactive     PSA     0.00 - 4.00 ng/mL  1.69    LDH     125 - 245 U/L   278 (H)   CEA     0.0 - 4.7 ng/mL    41.6 (H)  CA 19-9     0 - 35 U/mL    1,990 (H)  AFP Tumor Marker     0.0 - 8.3 ng/mL    1.9  CA 19-9     0 - 35 U/mL      CEA ([facility])     0.00 - 5.00 ng/mL       Component     Latest Ref Rng & Units 11/16/2016  Vitamin B12     180 - 914 pg/mL   HIV     Non Reactive   PSA     0.00 - 4.00 ng/mL   LDH     125 - 245 U/L 200  CEA     0.0 - 4.7 ng/mL   CA 19-9     0 - 35 U/mL   AFP Tumor Marker     0.0 - 8.3 ng/mL   CA 19-9     0 - 35 U/mL 9,314 (H)  CEA ([facility])     0.00 - 5.00 ng/mL 78.96 (H)    Component     Latest Ref Rng & Units 10/11/2016  Anticardiolipin Ab,IgG,Qn     0 - 14 GPL U/mL <9  Anticardiolipin Ab,IgM,Qn     0 - 12 MPL U/mL 11  Anticardiolipin Ab,IgA,Qn     0 - 11 APL U/mL 9  Beta-2 Glycoprotein I Ab, IgG     0 - 20 GPI IgG units <9  Beta-2-Glycoprotein I IgM     0 - 32 GPI IgM units <9  Beta-2-Glycoprotein I IgA     0 - 25 GPI IgA units <9  PTT Lupus Anticoagulant     0.0 - 51.9 sec 32.7  DRVVT     0.0 - 47.0 sec 58.9 (H)  Lupus Anticoag Interp      Comment:  Recommendations-F5LEID:      Comment  Comment      Comment  Recommendations-PTGENE:      Comment  Additional Information      Comment  dRVVT Mix     0.0 - 47.0 sec 44.3   Lupus anticoagulant panel      The  value has a corrected status.      No reference range information available      Resulting Lab: [facility]      Comments:           (NOTE)           No lupus anticoagulant was detected.   Prothrombin gene mutation      No reference range information available      Resulting Lab: [facility]      Comments:           (NOTE)  NEGATIVE           No mutation identified.  Factor 5 leiden      No reference range information available      Resulting Lab: [facility]      Comments:           (NOTE)           Result:  Negative (no mutation found)   RADIOGRAPHIC STUDIES: I have personally reviewed the radiological images as listed and agreed with the findings in the report. No results found.  ASSESSMENT & PLAN:   66 year old male with complicated medical situation is highly above with  1) Intra-abdominal malignancy  PET/CT was done on 10/10/2016 and showed Two FDG avid foci within the wall of the stomach along the posterior aspect of the greater curvature. These are fairly FDG avid SUV 12.9. No overt discrete lesions on CT. Hypermetabolic gastrohepatic ligament lymph nodes, gastrosplenic lymph nodes, porta hepatis lymph nodes and distal paraesophageal lymph nodes consistent with metastatic lymph nodes.  2) increasingly elevated CA-19-9 level was 1990 on 10/11/2016 today increased to 9300.  3) elevated CEA level - 41 now upto 79. 4) Elevated LDH has now normalized - was slightly elevated due to his myocardial infarction.  Overall picture is concerning for pancreatico-biliary or Upper GI malignancy.    Possible stomach lesions with upper abdominal /retroperitoneal FDG avid lymphadenopathy. Concerning for malignancy - ?gastric cancer vs GIST vs lymphoma vs other intra-abdominal metastatic malignancy.  2) Multiple thrombotic events (venous and arterial) ECHO no RT to left shunt. Extensive DVT/PE 04/08/2992 B/l embolic CVA's 02/19/6966 Anterior wall STEMI s/p LAD PCI with DES on  10/11/2016.  Hypercoagulable status likely related to his suspected malignancy. Some fhx of clotting issues in his mother per wife but negative factor V Leiden and prothrombin gene mutation. Negative workup for antiphospholipid antibody syndrome.  Plan -Patient is doing better following his recent STEMI with systolic ischemic cardiomyopathy and is currently off aspirin since 11/15/2016. -He continues to be on Plavix for his LAD drug-eluting stent placement on 10/11/2016. -Continues to be on therapeutic Lovenox for multiple thrombotic events. -its a challenge trying to obtain a tissue diagnosis for his suspected abdominal malignancy due to need for and difficulty with interrupting anticoagulation and antiplatelet therapy and no superficial lesions that are easily accessible for biopsy. -with regarding to anticoagulation -- would be able to using IV heparin bridging in the next 3-4 weeks.  -However antiplatelet bridging would input input and clearance from cardiology perspective. At this time cardiology is recommending at least 3 months of uninterrupted antiplatelet therapy prior to introduction for tissue diagnosis. We'll recheck Dr. Tamala Julian to determine if an IV Integrilin based approach might be feasible prior to this time line since the patient will likely decompensate from malignancy standpoint in that timeframe. -Endoscopic approach to evaluate stomach lesions and with EUS as needed to get biopsy would probably be the best approach however we have ordered a CT abdomen and pelvis to be done in the next 1-2 weeks to reevaluate his intra-abdominal disease to determine biopsy planning. -If that is overt disease progression or more clear anatomic determination of the primary source of his tumor which appears to be hepatobiliary and is cardiology determines that holding antiplatelet therapy would be life-threatening prior to the 3 months would need to make a difficult decision regarding whether it would be  possible to offer the patient palliative chemotherapy based on clinical diagnosis pending a tissue diagnosis.   Additional Labs today CT  abd/pelvis in 2 weeks f/u based on results with Dr [name]  All of the patients questions were answered with apparent satisfaction. The patient knows to call the clinic with any problems, questions or concerns.  I spent 40 minutes counseling the patient face to face. The total time spent in the appointment was 60 minutes and more than 50% was on counseling and direct patient cares.    Sullivan Lone MD [place] AAHIVMS [facility] Hematology/Oncology Physician [facility]  (Office):       [phone] (Work cell):  [phone] (Fax):           [phone]

## 2016-11-19 NOTE — Progress Notes (Signed)
Yes. Set up biopsy and we will work on a bridge strategy.

## 2016-11-23 ENCOUNTER — Ambulatory Visit: Payer: Medicare Other | Admitting: Family Medicine

## 2016-11-23 ENCOUNTER — Encounter (HOSPITAL_COMMUNITY)
Admission: RE | Admit: 2016-11-23 | Discharge: 2016-11-23 | Disposition: A | Discharge status: Home / Self Care | Payer: Medicare Other | Source: Ambulatory Visit | Attending: Interventional Cardiology | Admitting: Interventional Cardiology

## 2016-11-23 VITALS — BP 92/60 | HR 69 | Ht 73.0 in | Wt 229.1 lb

## 2016-11-23 DIAGNOSIS — Z955 Presence of coronary angioplasty implant and graft: Secondary | ICD-10-CM

## 2016-11-23 DIAGNOSIS — I214 Non-ST elevation (NSTEMI) myocardial infarction: Secondary | ICD-10-CM

## 2016-11-23 DIAGNOSIS — I2102 ST elevation (STEMI) myocardial infarction involving left anterior descending coronary artery: Secondary | ICD-10-CM | POA: Insufficient documentation

## 2016-11-23 NOTE — Progress Notes (Signed)
Cardiac Individual Treatment Plan  Patient Details  Name: [name] MRN: [id number] Date of Birth: [birth year] Referring Provider:     CARDIAC REHAB PHASE II ORIENTATION from 11/23/2016 in [place]  Referring Provider  Dr. Tamala Julian      Initial Encounter Date:    CARDIAC REHAB PHASE II ORIENTATION from 11/23/2016 in [place]  Date  11/23/16  Referring Provider  Dr. Tamala Julian      Visit Diagnosis: NSTEMI (non-ST elevated myocardial infarction) [facility])  Status post coronary artery stent placement  Patient's Home Medications on Admission:  Current Outpatient Prescriptions:  .  acetaminophen (TYLENOL) 325 MG tablet, Take 650 mg by mouth every 6 (six) hours as needed for moderate pain., Disp: , Rfl:  .  atorvastatin (LIPITOR) 80 MG tablet, Take 1 tablet (80 mg total) by mouth daily., Disp: 90 tablet, Rfl: 3 .  carvedilol (COREG) 6.25 MG tablet, Take 1 tablet (6.25 mg total) by mouth 2 (two) times daily., Disp: 180 tablet, Rfl: 3 .  clopidogrel (PLAVIX) 75 MG tablet, Take 1 tablet (75 mg total) by mouth daily., Disp: 30 tablet, Rfl: 1 .  enoxaparin (LOVENOX) 100 MG/ML injection, Inject 1 mL (100 mg total) into the skin every 12 (twelve) hours., Disp: 60 Syringe, Rfl: 1 .  escitalopram (LEXAPRO) 20 MG tablet, Take 1 tablet (20 mg total) by mouth daily., Disp: 90 tablet, Rfl: 2 .  furosemide (LASIX) 20 MG tablet, Take 1 tablet (20 mg total) by mouth daily., Disp: 90 tablet, Rfl: 3 .  losartan (COZAAR) 25 MG tablet, Take 0.5 tablets (12.5 mg total) by mouth 2 (two) times daily., Disp: 30 tablet, Rfl: 1 .  nitroGLYCERIN (NITROSTAT) 0.4 MG SL tablet, Place 1 tablet (0.4 mg total) under the tongue every 5 (five) minutes as needed for chest pain (up to 3 doses)., Disp: 25 tablet, Rfl: 3 .  pantoprazole (PROTONIX) 40 MG tablet, Take 1 tablet (40 mg total) by mouth daily., Disp: 30 tablet, Rfl: 1 .  spironolactone (ALDACTONE) 25 MG tablet, Take 0.5 tablets  (12.5 mg total) by mouth daily., Disp: 30 tablet, Rfl: 1  Past Medical History: Past Medical History:  Diagnosis Date  . CAD in native artery    a. STEMI 09/2016 s/p DES to mLAD, residual 65-75% OM1 disease treated medically. EF 30-35% by cath, 35-40% by echo at dc.  . Chronic systolic CHF (congestive heart failure) ([place])    a. Dx 09/2016 - EF 30-35% by cath, 35-40% by echo.  . Depression   . Dilatation of aorta ([place])    a. CT 09/2016: dilation of the ascending thoracic aorta measuring 4.4 cm in maximum diameter, recommend f/u 2021 if appropriate.  [name] Kitchen DVT (deep venous thrombosis) ([place])    a. Bilat DVT noted 08/2016.  . Essential hypertension   . GERD (gastroesophageal reflux disease)   . 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 History of stroke    a. 09/2016.  [name] Kitchen Hypercoagulable state ([place])   . Hyperlipidemia   . Hyponatremia   . Ischemic cardiomyopathy   . Lymphadenopathy    a. Abd lymphadenopathy concerning for malignancy noted in 09/2016.  [name] Kitchen Normocytic anemia   . PE (pulmonary thromboembolism) ([place])    a. Bilat PE 08/2016.  . Stroke ([place])   . Suspected sleep apnea     Tobacco Use: History  Smoking Status  . Never Smoker  Smokeless Tobacco  . Never Used    Labs: Recent Review  Flowsheet Data    Labs for ITP Cardiac and Pulmonary Rehab Latest Ref Rng & Units 07/25/2016 09/26/2016 10/11/2016   Cholestrol 0 - 200 mg/dL 134 95 131   LDLCALC 0 - 99 mg/dL 73 49 68   HDL >40 mg/dL 41 29(L) 37(L)   Trlycerides <150 mg/dL 98 86 131   Hemoglobin A1c 4.8 - 5.6 % - 5.3 -      Capillary Blood Glucose: Lab Results  Component Value Date   GLUCAP 129 (H) 10/15/2016   GLUCAP 106 (H) 10/10/2016   GLUCAP 101 (H) 09/25/2016   GLUCAP 105 (H) 09/14/2016   GLUCAP 115 (H) 09/13/2016     Exercise Target Goals: Date: 11/23/16  Exercise Program Goal: Individual exercise prescription set with THRR, safety & activity barriers. Participant demonstrates ability to understand and report RPE  using BORG scale, to self-measure pulse accurately, and to acknowledge the importance of the exercise prescription.  Exercise Prescription Goal: Starting with aerobic activity 30 plus minutes a day, 3 days per week for initial exercise prescription. Provide home exercise prescription and guidelines that participant acknowledges understanding prior to discharge.  Activity Barriers & Risk Stratification:     Activity Barriers & Cardiac Risk Stratification - 11/23/16 1219      Activity Barriers & Cardiac Risk Stratification   Activity Barriers Other (comment)   Comments Has had a stroke that has affected his right leg. Mobility is fair to good.    Cardiac Risk Stratification High      6 Minute Walk:     6 Minute Walk    Row Name 11/23/16 1205         6 Minute Walk   Phase Initial     Distance 1200 feet     Distance % Change 0 %     Walk Time 6 minutes     # of Rest Breaks 0     MPH 2.27     METS 2.74     RPE 15     Perceived Dyspnea  11     VO2 Peak 9.57     Symptoms No     Resting HR 69 bpm     Resting BP 92/60     Max Ex. HR 104 bpm     Max Ex. BP 118/68     2 Minute Post BP 106/64        Oxygen Initial Assessment:   Oxygen Re-Evaluation:   Oxygen Discharge (Final Oxygen Re-Evaluation):   Initial Exercise Prescription:     Initial Exercise Prescription - 11/23/16 1200      Date of Initial Exercise RX and Referring Provider   Date 11/23/16   Referring Provider Dr. Tamala Julian     NuStep   Level 2   SPM 17   Minutes 15   METs 1.9     Recumbant Elliptical   Level 1   RPM 31   Watts 21   Minutes 20   METs 1.4     Prescription Details   Frequency (times per week) 3   Duration Progress to 30 minutes of continuous aerobic without signs/symptoms of physical distress     Intensity   THRR 40-80% of Max Heartrate [id number]   Ratings of Perceived Exertion 11-13   Perceived Dyspnea 0-4     Progression   Progression Continue progressive overload as  per policy without signs/symptoms or physical distress.     Resistance Training   Training Prescription Yes   Weight 1  Reps 10-15      Perform Capillary Blood Glucose checks as needed.  Exercise Prescription Changes:   Exercise Comments:   Exercise Goals and Review:      Exercise Goals    Row Name 11/23/16 1221             Exercise Goals   Increase Physical Activity Yes       Intervention Provide advice, education, support and counseling about physical activity/exercise needs.;Develop an individualized exercise prescription for aerobic and resistive training based on initial evaluation findings, risk stratification, comorbidities and participant's personal goals.       Expected Outcomes Achievement of increased cardiorespiratory fitness and enhanced flexibility, muscular endurance and strength shown through measurements of functional capacity and personal statement of participant.       Increase Strength and Stamina Yes       Intervention Develop an individualized exercise prescription for aerobic and resistive training based on initial evaluation findings, risk stratification, comorbidities and participant's personal goals.;Provide advice, education, support and counseling about physical activity/exercise needs.          Exercise Goals Re-Evaluation :    Discharge Exercise Prescription (Final Exercise Prescription Changes):   Nutrition:  Target Goals: Understanding of nutrition guidelines, daily intake of sodium 1500mg , cholesterol 200mg , calories 30% from fat and 7% or less from saturated fats, daily to have 5 or more servings of fruits and vegetables.  Biometrics:      Post Biometrics - 11/23/16 1207       Post  Biometrics   Height 6\' 1"  (1.854 m)   Weight 229 lb 0.9 oz (103.9 kg)   Waist Circumference 40 inches   Hip Circumference 43 inches   Waist to Hip Ratio 0.93 %   BMI (Calculated) 30.3   Triceps Skinfold 16 mm   % Body Fat 28.1 %   Grip  Strength 78 kg   Flexibility 13.83 in   Single Leg Stand 60 seconds      Nutrition Therapy Plan and Nutrition Goals:   Nutrition Discharge: Rate Your Plate Scores:   Nutrition Goals Re-Evaluation:   Nutrition Goals Discharge (Final Nutrition Goals Re-Evaluation):   Psychosocial: Target Goals: Acknowledge presence or absence of significant depression and/or stress, maximize coping skills, provide positive support system. Participant is able to verbalize types and ability to use techniques and skills needed for reducing stress and depression.  Initial Review & Psychosocial Screening:     Initial Psych Review & Screening - 11/23/16 1223      Initial Review   Current issues with None Identified     Family Dynamics   Good Support System? Yes     Barriers   Psychosocial barriers to participate in program There are no identifiable barriers or psychosocial needs.     Screening Interventions   Interventions Encouraged to exercise      Quality of Life Scores:     Quality of Life - 11/23/16 1208      Quality of Life Scores   Health/Function Pre 23.27 %   Socioeconomic Pre 24.19 %   Psych/Spiritual Pre 20.29 %   Family Pre 21 %   GLOBAL Pre 22.6 %      PHQ-9: Recent Review Flowsheet Data    Depression screen [facility] 2/9 11/23/2016 09/16/2016 09/10/2016 07/25/2016 07/04/2016   Decreased Interest 0 0 0 0 0   Down, Depressed, Hopeless 0 0 0 0 0   PHQ - 2 Score 0 0 0 0 0   Altered sleeping  0 - - - -   Tired, decreased energy 0 - - - -   Change in appetite 1 - - - -   Feeling bad or failure about yourself  0 - - - -   Trouble concentrating 0 - - - -   Moving slowly or fidgety/restless 0 - - - -   PHQ-9 Score 1 - - - -   Difficult doing work/chores Not difficult at all - - - -     Interpretation of Total Score  Total Score Depression Severity:  1-4 = Minimal depression, 5-9 = Mild depression, 10-14 = Moderate depression, 15-19 = Moderately severe depression, 20-27 =  Severe depression   Psychosocial Evaluation and Intervention:     Psychosocial Evaluation - 11/23/16 1223      Psychosocial Evaluation & Interventions   Interventions Encouraged to exercise with the program and follow exercise prescription   Continue Psychosocial Services  No Follow up required      Psychosocial Re-Evaluation:   Psychosocial Discharge (Final Psychosocial Re-Evaluation):   Vocational Rehabilitation: Provide vocational rehab assistance to qualifying candidates.   Vocational Rehab Evaluation & Intervention:     Vocational Rehab - 11/23/16 1216      Initial Vocational Rehab Evaluation & Intervention   Assessment shows need for Vocational Rehabilitation No      Education: Education Goals: Education classes will be provided on a weekly basis, covering required topics. Participant will state understanding/return demonstration of topics presented.  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 Discus how high blood pressure affects the body and ways to reduce high blood pressure.   Exercise and Your Heart -Discuss why it is important to exercise, the FITT principles of exercise, normal and abnormal responses to exercise, and how to exercise safely.   Angina -Discuss definition of angina, causes of angina, treatment of angina, and how to decrease risk of having angina.   Cardiac Medications -Review what the following cardiac medications are used for, how they affect the body, and side effects that may occur when taking the medications.  Medications include Aspirin, Beta blockers, calcium channel blockers, ACE Inhibitors, angiotensin receptor  blockers, diuretics, digoxin, and antihyperlipidemics.   Congestive Heart Failure -Discuss the definition of CHF, how to live with CHF, the signs and symptoms of CHF, and how keep track of weight and sodium intake.   Heart Disease and Intimacy -Discus the effect sexual activity has on the heart, how changes occur during intimacy as we age, and safety during sexual activity.   Smoking Cessation / COPD -Discuss different methods to quit smoking, the health benefits of quitting smoking, and the definition of COPD.   Nutrition I: Fats -Discuss the types of cholesterol, what cholesterol does to the heart, and how cholesterol levels can be controlled.   Nutrition II: Labels -Discuss the different components of food labels and how to read food label   Heart Parts and Heart Disease -Discuss the anatomy of the heart, the pathway of blood circulation through the heart, and these are affected by heart disease.   Stress I: Signs and Symptoms -Discuss the causes of stress, how stress may lead to anxiety and depression, and ways to limit stress.   Stress II:  Relaxation -Discuss different types of relaxation techniques to limit stress.   Warning Signs of Stroke / TIA -Discuss definition of a stroke, what the signs and symptoms are of a stroke, and how to identify when someone is having stroke.   Knowledge Questionnaire Score:     Knowledge Questionnaire Score - 11/23/16 1216      Knowledge Questionnaire Score   Pre Score 23/24      Core Components/Risk Factors/Patient Goals at Admission:     Personal Goals and Risk Factors at Admission - 11/23/16 1221      Core Components/Risk Factors/Patient Goals on Admission    Weight Management Weight Maintenance   Personal Goal Other Yes   Personal Goal Improvement of my health, get back to playing golf   Intervention Attend CR 3 x week and supplement exercise at home 2 x week on his recumbent bike.    Expected Outcomes Achieve his  personal goals.       Core Components/Risk Factors/Patient Goals Review:      Goals and Risk Factor Review    Row Name 11/23/16 1223             Core Components/Risk Factors/Patient Goals Review   Personal Goals Review Weight Management/Obesity          Core Components/Risk Factors/Patient Goals at Discharge (Final Review):      Goals and Risk Factor Review - 11/23/16 1223      Core Components/Risk Factors/Patient Goals Review   Personal Goals Review Weight Management/Obesity      ITP Comments:     ITP Comments    Row Name 11/23/16 1232           ITP Comments [name] is a 66 year old male who also had a recent CVA that has affected his (R) leg causing it to be numb. He is able to walk without any assistive devices. He will not be alble to use the treadmill. All other functions have returned. Cognitively his is a little slow but able to function. 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           Comments: Patient arrived for 1st visit/orientation/education at 0800. Patient was referred to CR by Dr. Daneen Schick due to NSTEMI (I21.4) and Stent Placement (Z95.5). During orientation advised patient on arrival and appointment times what to wear, what to do before, during and after exercise. Reviewed attendance and class policy. Talked about inclement weather and class consultation policy. Pt is scheduled to return Cardiac Rehab on 11/28/16 at 0930. Pt was advised to come to class 15 minutes before class starts. Patient was also given instructions on meeting with the dietician and attending the Family Structure classes. Pt is eager to get started. Patient participated in warm up stretches followed by light weights and resistance bands. Patient was then able to complete 6 minute walk test. Patient c/o right hip pain at end of walk test 6/10. Patient subsided to 2/10 after 2 min rest. Then went completley away after resting for 10 minutes. Patient was measured for the  equipment. Discussed equipment safety with patient. Took patient pre-anthropometric measurements. Patient finished visit at 11:00.

## 2016-11-23 NOTE — Progress Notes (Signed)
Cardiac/Pulmonary Rehab Medication Review by a Pharmacist  Does the patient  feel that his/her medications are working for him/her?  yes  Has the patient been experiencing any side effects to the medications prescribed?  no  Does the patient measure his/her own blood pressure or blood glucose at home?  yes   Does the patient have any problems obtaining medications due to transportation or finances?   no  Understanding of regimen: good Understanding of indications: good Potential of compliance: excellent  Questions asked to Determine Patient Understanding of Medication Regimen:  1. What is the name of the medication?  2. What is the medication used for?  3. When should it be taken?  4. How much should be taken?  5. How will you take it?  6. What side effects should you report?  Understanding Defined as: Excellent: All questions above are correct Good: Questions 1-4 are correct Fair: Questions 1-2 are correct  Poor: 1 or none of the above questions are correct   Pharmacist comments: Wife states that pt does get some bruising around belly due to Lovenox shots but no other significant adverse effects.  Wife states pt had issues when pt was on Xarelto and Eliquis in terms of stroke and MI.  Pt does c/o some stomach pain / indigestion despite being on Protonix and asked what the maximum dose is, may want to increase dose if doesn't improve.  They have asked if Dr Tamala Julian will send Rx to [facility] to increase Protonix.  Pt does also take Tylenol for hip pain.    Hart Robinsons A 11/23/2016 9:00 AM

## 2016-11-23 NOTE — Progress Notes (Signed)
Cardiac Individual Treatment Plan  Patient Details  Name: [name] MRN: [id number] Date of Birth: Dec 14, 1950 Referring Provider:     CARDIAC REHAB PHASE II ORIENTATION from 11/23/2016 in [place]  Referring Provider  Dr. Tamala Julian      Initial Encounter Date:    CARDIAC REHAB PHASE II ORIENTATION from 11/23/2016 in [place]  Date  11/23/16  Referring Provider  Dr. Tamala Julian      Visit Diagnosis: NSTEMI (non-ST elevated myocardial infarction) [facility])  Status post coronary artery stent placement  Patient's Home Medications on Admission:  Current Outpatient Prescriptions:  .  acetaminophen (TYLENOL) 325 MG tablet, Take 650 mg by mouth every 6 (six) hours as needed for moderate pain., Disp: , Rfl:  .  atorvastatin (LIPITOR) 80 MG tablet, Take 1 tablet (80 mg total) by mouth daily., Disp: 90 tablet, Rfl: 3 .  carvedilol (COREG) 6.25 MG tablet, Take 1 tablet (6.25 mg total) by mouth 2 (two) times daily., Disp: 180 tablet, Rfl: 3 .  clopidogrel (PLAVIX) 75 MG tablet, Take 1 tablet (75 mg total) by mouth daily., Disp: 30 tablet, Rfl: 1 .  enoxaparin (LOVENOX) 100 MG/ML injection, Inject 1 mL (100 mg total) into the skin every 12 (twelve) hours., Disp: 60 Syringe, Rfl: 1 .  escitalopram (LEXAPRO) 20 MG tablet, Take 1 tablet (20 mg total) by mouth daily., Disp: 90 tablet, Rfl: 2 .  furosemide (LASIX) 20 MG tablet, Take 1 tablet (20 mg total) by mouth daily., Disp: 90 tablet, Rfl: 3 .  losartan (COZAAR) 25 MG tablet, Take 0.5 tablets (12.5 mg total) by mouth 2 (two) times daily., Disp: 30 tablet, Rfl: 1 .  nitroGLYCERIN (NITROSTAT) 0.4 MG SL tablet, Place 1 tablet (0.4 mg total) under the tongue every 5 (five) minutes as needed for chest pain (up to 3 doses)., Disp: 25 tablet, Rfl: 3 .  pantoprazole (PROTONIX) 40 MG tablet, Take 1 tablet (40 mg total) by mouth daily., Disp: 30 tablet, Rfl: 1 .  spironolactone (ALDACTONE) 25 MG tablet, Take 0.5 tablets  (12.5 mg total) by mouth daily., Disp: 30 tablet, Rfl: 1  Past Medical History: Past Medical History:  Diagnosis Date  . CAD in native artery    a. STEMI 09/2016 s/p DES to mLAD, residual 65-75% OM1 disease treated medically. EF 30-35% by cath, 35-40% by echo at dc.  . Chronic systolic CHF (congestive heart failure) ([place])    a. Dx 09/2016 - EF 30-35% by cath, 35-40% by echo.  . Depression   . Dilatation of aorta ([place])    a. CT 09/2016: dilation of the ascending thoracic aorta measuring 4.4 cm in maximum diameter, recommend f/u 2021 if appropriate.  [name] Kitchen DVT (deep venous thrombosis) ([place])    a. Bilat DVT noted 08/2016.  . Essential hypertension   . GERD (gastroesophageal reflux disease)   . 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 History of stroke    a. 09/2016.  [name] Kitchen Hypercoagulable state ([place])   . Hyperlipidemia   . Hyponatremia   . Ischemic cardiomyopathy   . Lymphadenopathy    a. Abd lymphadenopathy concerning for malignancy noted in 09/2016.  [name] Kitchen Normocytic anemia   . PE (pulmonary thromboembolism) ([place])    a. Bilat PE 08/2016.  . Stroke ([address])   . Suspected sleep apnea     Tobacco Use: History  Smoking Status  . Never Smoker  Smokeless Tobacco  . Never Used    Labs: Recent Review  Flowsheet Data    Labs for ITP Cardiac and Pulmonary Rehab Latest Ref Rng & Units 07/25/2016 09/26/2016 10/11/2016   Cholestrol 0 - 200 mg/dL 134 95 131   LDLCALC 0 - 99 mg/dL 73 49 68   HDL >40 mg/dL 41 29(L) 37(L)   Trlycerides <150 mg/dL 98 86 131   Hemoglobin A1c 4.8 - 5.6 % - 5.3 -      Capillary Blood Glucose: Lab Results  Component Value Date   GLUCAP 129 (H) 10/15/2016   GLUCAP 106 (H) 10/10/2016   GLUCAP 101 (H) 09/25/2016   GLUCAP 105 (H) 09/14/2016   GLUCAP 115 (H) 09/13/2016     Exercise Target Goals: Date: 11/23/16  Exercise Program Goal: Individual exercise prescription set with THRR, safety & activity barriers. Participant demonstrates ability to understand and report RPE  using BORG scale, to self-measure pulse accurately, and to acknowledge the importance of the exercise prescription.  Exercise Prescription Goal: Starting with aerobic activity 30 plus minutes a day, 3 days per week for initial exercise prescription. Provide home exercise prescription and guidelines that participant acknowledges understanding prior to discharge.  Activity Barriers & Risk Stratification:     Activity Barriers & Cardiac Risk Stratification - 11/23/16 1219      Activity Barriers & Cardiac Risk Stratification   Activity Barriers Other (comment)   Comments Has had a stroke that has affected his right leg. Mobility is fair to good.    Cardiac Risk Stratification High      6 Minute Walk:     6 Minute Walk    Row Name 11/23/16 1205         6 Minute Walk   Phase Initial     Distance 1200 feet     Distance % Change 0 %     Walk Time 6 minutes     # of Rest Breaks 0     MPH 2.27     METS 2.74     RPE 15     Perceived Dyspnea  11     VO2 Peak 9.57     Symptoms No     Resting HR 69 bpm     Resting BP 92/60     Max Ex. HR 104 bpm     Max Ex. BP 118/68     2 Minute Post BP 106/64        Oxygen Initial Assessment:   Oxygen Re-Evaluation:   Oxygen Discharge (Final Oxygen Re-Evaluation):   Initial Exercise Prescription:     Initial Exercise Prescription - 11/23/16 1200      Date of Initial Exercise RX and Referring Provider   Date 11/23/16   Referring Provider Dr. Tamala Julian     NuStep   Level 2   SPM 17   Minutes 15   METs 1.9     Recumbant Elliptical   Level 1   RPM 31   Watts 21   Minutes 20   METs 1.4     Prescription Details   Frequency (times per week) 3   Duration Progress to 30 minutes of continuous aerobic without signs/symptoms of physical distress     Intensity   THRR 40-80% of Max Heartrate [phone]   Ratings of Perceived Exertion 11-13   Perceived Dyspnea 0-4     Progression   Progression Continue progressive overload as  per policy without signs/symptoms or physical distress.     Resistance Training   Training Prescription Yes   Weight 1  Reps 10-15      Perform Capillary Blood Glucose checks as needed.  Exercise Prescription Changes:   Exercise Comments:   Exercise Goals and Review:     Exercise Goals    Row Name 11/23/16 1221             Exercise Goals   Increase Physical Activity Yes       Intervention Provide advice, education, support and counseling about physical activity/exercise needs.;Develop an individualized exercise prescription for aerobic and resistive training based on initial evaluation findings, risk stratification, comorbidities and participant's personal goals.       Expected Outcomes Achievement of increased cardiorespiratory fitness and enhanced flexibility, muscular endurance and strength shown through measurements of functional capacity and personal statement of participant.       Increase Strength and Stamina Yes       Intervention Develop an individualized exercise prescription for aerobic and resistive training based on initial evaluation findings, risk stratification, comorbidities and participant's personal goals.;Provide advice, education, support and counseling about physical activity/exercise needs.          Exercise Goals Re-Evaluation :    Discharge Exercise Prescription (Final Exercise Prescription Changes):   Nutrition:  Target Goals: Understanding of nutrition guidelines, daily intake of sodium 1500mg , cholesterol 200mg , calories 30% from fat and 7% or less from saturated fats, daily to have 5 or more servings of fruits and vegetables.  Biometrics:      Post Biometrics - 11/23/16 1207       Post  Biometrics   Height 6\' 1"  (1.854 m)   Weight 229 lb 0.9 oz (103.9 kg)   Waist Circumference 40 inches   Hip Circumference 43 inches   Waist to Hip Ratio 0.93 %   BMI (Calculated) 30.3   Triceps Skinfold 16 mm   % Body Fat 28.1 %   Grip  Strength 78 kg   Flexibility 13.83 in   Single Leg Stand 60 seconds      Nutrition Therapy Plan and Nutrition Goals:   Nutrition Discharge: Rate Your Plate Scores:   Nutrition Goals Re-Evaluation:   Nutrition Goals Discharge (Final Nutrition Goals Re-Evaluation):   Psychosocial: Target Goals: Acknowledge presence or absence of significant depression and/or stress, maximize coping skills, provide positive support system. Participant is able to verbalize types and ability to use techniques and skills needed for reducing stress and depression.  Initial Review & Psychosocial Screening:     Initial Psych Review & Screening - 11/23/16 1223      Initial Review   Current issues with None Identified     Family Dynamics   Good Support System? Yes     Barriers   Psychosocial barriers to participate in program There are no identifiable barriers or psychosocial needs.     Screening Interventions   Interventions Encouraged to exercise      Quality of Life Scores:     Quality of Life - 11/23/16 1208      Quality of Life Scores   Health/Function Pre 23.27 %   Socioeconomic Pre 24.19 %   Psych/Spiritual Pre 20.29 %   Family Pre 21 %   GLOBAL Pre 22.6 %      PHQ-9: Recent Review Flowsheet Data    Depression screen [facility] 2/9 11/23/2016 09/16/2016 09/10/2016 07/25/2016 07/04/2016   Decreased Interest 0 0 0 0 0   Down, Depressed, Hopeless 0 0 0 0 0   PHQ - 2 Score 0 0 0 0 0   Altered sleeping 0 - - - -  Tired, decreased energy 0 - - - -   Change in appetite 1 - - - -   Feeling bad or failure about yourself  0 - - - -   Trouble concentrating 0 - - - -   Moving slowly or fidgety/restless 0 - - - -   PHQ-9 Score 1 - - - -   Difficult doing work/chores Not difficult at all - - - -     Interpretation of Total Score  Total Score Depression Severity:  1-4 = Minimal depression, 5-9 = Mild depression, 10-14 = Moderate depression, 15-19 = Moderately severe depression, 20-27 =  Severe depression   Psychosocial Evaluation and Intervention:     Psychosocial Evaluation - 11/23/16 1223      Psychosocial Evaluation & Interventions   Interventions Encouraged to exercise with the program and follow exercise prescription   Continue Psychosocial Services  No Follow up required      Psychosocial Re-Evaluation:   Psychosocial Discharge (Final Psychosocial Re-Evaluation):   Vocational Rehabilitation: Provide vocational rehab assistance to qualifying candidates.   Vocational Rehab Evaluation & Intervention:     Vocational Rehab - 11/23/16 1216      Initial Vocational Rehab Evaluation & Intervention   Assessment shows need for Vocational Rehabilitation No      Education: Education Goals: Education classes will be provided on a weekly basis, covering required topics. Participant will state understanding/return demonstration of topics presented.  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 Discus how high blood pressure affects the body and ways to reduce high blood pressure.   Exercise and Your Heart -Discuss why it is important to exercise, the FITT principles of exercise, normal and abnormal responses to exercise, and how to exercise safely.   Angina -Discuss definition of angina, causes of angina, treatment of angina, and how to decrease risk of having angina.   Cardiac Medications -Review what the following cardiac medications are used for, how they affect the body, and side effects that may occur when taking the medications.  Medications include Aspirin, Beta blockers, calcium channel blockers, ACE Inhibitors, angiotensin receptor  blockers, diuretics, digoxin, and antihyperlipidemics.   Congestive Heart Failure -Discuss the definition of CHF, how to live with CHF, the signs and symptoms of CHF, and how keep track of weight and sodium intake.   Heart Disease and Intimacy -Discus the effect sexual activity has on the heart, how changes occur during intimacy as we age, and safety during sexual activity.   Smoking Cessation / COPD -Discuss different methods to quit smoking, the health benefits of quitting smoking, and the definition of COPD.   Nutrition I: Fats -Discuss the types of cholesterol, what cholesterol does to the heart, and how cholesterol levels can be controlled.   Nutrition II: Labels -Discuss the different components of food labels and how to read food label   Heart Parts and Heart Disease -Discuss the anatomy of the heart, the pathway of blood circulation through the heart, and these are affected by heart disease.   Stress I: Signs and Symptoms -Discuss the causes of stress, how stress may lead to anxiety and depression, and ways to limit stress.   Stress II: Relaxation -Discuss different types of relaxation techniques  to limit stress.   Warning Signs of Stroke / TIA -Discuss definition of a stroke, what the signs and symptoms are of a stroke, and how to identify when someone is having stroke.   Knowledge Questionnaire Score:     Knowledge Questionnaire Score - 11/23/16 1216      Knowledge Questionnaire Score   Pre Score 23/24      Core Components/Risk Factors/Patient Goals at Admission:     Personal Goals and Risk Factors at Admission - 11/23/16 1221      Core Components/Risk Factors/Patient Goals on Admission    Weight Management Weight Maintenance   Personal Goal Other Yes   Personal Goal Improvement of my health, get back to playing golf   Intervention Attend CR 3 x week and supplement exercise at home 2 x week on his recumbent bike.    Expected Outcomes Achieve his  personal goals.       Core Components/Risk Factors/Patient Goals Review:      Goals and Risk Factor Review    Row Name 11/23/16 1223             Core Components/Risk Factors/Patient Goals Review   Personal Goals Review Weight Management/Obesity          Core Components/Risk Factors/Patient Goals at Discharge (Final Review):      Goals and Risk Factor Review - 11/23/16 1223      Core Components/Risk Factors/Patient Goals Review   Personal Goals Review Weight Management/Obesity      ITP Comments:     ITP Comments    Row Name 11/23/16 1232 11/23/16 1247         ITP Comments Mr. Dollins is a 66 year old male who also had a recent CVA that has affected his (R) leg causing it to be numb. He is able to walk without any assistive devices. He will not be alble to use the treadmill. All other functions have returned. Cognitively his is a little slow but able to function. 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  Patient new to program. Plans to start Monday 11/28/16.         Comments: Patient new to program. Plans to start Monday 11/28/16.

## 2016-11-28 ENCOUNTER — Encounter (HOSPITAL_COMMUNITY)
Admission: RE | Admit: 2016-11-28 | Discharge: 2016-11-28 | Disposition: A | Discharge status: Home / Self Care | Payer: Medicare Other | Source: Ambulatory Visit | Attending: Interventional Cardiology | Admitting: Interventional Cardiology

## 2016-11-28 DIAGNOSIS — Z955 Presence of coronary angioplasty implant and graft: Secondary | ICD-10-CM

## 2016-11-28 DIAGNOSIS — I2102 ST elevation (STEMI) myocardial infarction involving left anterior descending coronary artery: Secondary | ICD-10-CM | POA: Diagnosis not present

## 2016-11-28 DIAGNOSIS — I214 Non-ST elevation (NSTEMI) myocardial infarction: Secondary | ICD-10-CM

## 2016-11-28 NOTE — Progress Notes (Signed)
Daily Session Note  Patient Details  Name: [name] MRN: [id number] Date of Birth: [birth year] Referring Provider:     CARDIAC REHAB PHASE II ORIENTATION from 11/23/2016 in [place]  Referring Provider  Dr. Tamala Julian      Encounter Date: 11/28/2016  Check In:     Session Check In - 11/28/16 0930      Check-In   Location [facility]   Staff Present Russella Dar, MS, EP, [facility], Exercise Physiologist;Gregory Luther Parody, BS, EP, Exercise Physiologist   Supervising physician immediately available to respond to emergencies See telemetry face sheet for immediately available MD   Medication changes reported     No   Fall or balance concerns reported    No   Warm-up and Cool-down Performed as group-led instruction   Resistance Training Performed Yes   VAD Patient? No     Pain Assessment   Currently in Pain? No/denies   Pain Score 0-No pain   Multiple Pain Sites No      Capillary Blood Glucose: No results found for this or any previous visit (from the past 24 hour(s)).    History  Smoking Status  . Never Smoker  Smokeless Tobacco  . Never Used    Goals Met:  Independence with exercise equipment Exercise tolerated well No report of cardiac concerns or symptoms Strength training completed today  Goals Unmet:  Not Applicable  Comments: Check out 1030.   Dr. Kate Sable is Medical Director for [facility] Cardiac and Pulmonary Rehab.

## 2016-11-29 ENCOUNTER — Encounter: Payer: Self-pay | Admitting: Interventional Cardiology

## 2016-11-30 ENCOUNTER — Ambulatory Visit (HOSPITAL_COMMUNITY)
Admission: RE | Admit: 2016-11-30 | Discharge: 2016-11-30 | Disposition: A | Discharge status: Home / Self Care | Payer: Medicare Other | Source: Ambulatory Visit | Attending: Hematology | Admitting: Hematology

## 2016-11-30 ENCOUNTER — Encounter (HOSPITAL_COMMUNITY): Payer: Medicare Other

## 2016-11-30 DIAGNOSIS — K319 Disease of stomach and duodenum, unspecified: Secondary | ICD-10-CM | POA: Diagnosis not present

## 2016-11-30 DIAGNOSIS — R591 Generalized enlarged lymph nodes: Secondary | ICD-10-CM | POA: Diagnosis not present

## 2016-11-30 DIAGNOSIS — R911 Solitary pulmonary nodule: Secondary | ICD-10-CM | POA: Insufficient documentation

## 2016-11-30 DIAGNOSIS — R978 Other abnormal tumor markers: Secondary | ICD-10-CM | POA: Diagnosis not present

## 2016-11-30 DIAGNOSIS — R600 Localized edema: Secondary | ICD-10-CM | POA: Diagnosis not present

## 2016-11-30 DIAGNOSIS — R590 Localized enlarged lymph nodes: Secondary | ICD-10-CM

## 2016-11-30 DIAGNOSIS — C787 Secondary malignant neoplasm of liver and intrahepatic bile duct: Secondary | ICD-10-CM | POA: Insufficient documentation

## 2016-11-30 DIAGNOSIS — D4989 Neoplasm of unspecified behavior of other specified sites: Secondary | ICD-10-CM | POA: Insufficient documentation

## 2016-11-30 DIAGNOSIS — I700 Atherosclerosis of aorta: Secondary | ICD-10-CM | POA: Insufficient documentation

## 2016-11-30 MED ORDER — IOPAMIDOL (ISOVUE-300) INJECTION 61%
INTRAVENOUS | Status: AC
  Administered 2016-11-30: 100 mL
  Filled 2016-11-30: qty 30

## 2016-11-30 MED ORDER — IOPAMIDOL (ISOVUE-300) INJECTION 61%
INTRAVENOUS | Status: AC
  Administered 2016-11-30: 30 mL via ORAL
  Filled 2016-11-30: qty 100

## 2016-12-02 ENCOUNTER — Other Ambulatory Visit: Payer: Self-pay | Admitting: Hematology

## 2016-12-02 ENCOUNTER — Telehealth: Payer: Self-pay | Admitting: *Deleted

## 2016-12-02 ENCOUNTER — Encounter (HOSPITAL_COMMUNITY)
Admission: RE | Admit: 2016-12-02 | Discharge: 2016-12-02 | Disposition: A | Discharge status: Home / Self Care | Payer: Medicare Other | Source: Ambulatory Visit | Attending: Interventional Cardiology | Admitting: Interventional Cardiology

## 2016-12-02 DIAGNOSIS — Z955 Presence of coronary angioplasty implant and graft: Secondary | ICD-10-CM

## 2016-12-02 DIAGNOSIS — I2102 ST elevation (STEMI) myocardial infarction involving left anterior descending coronary artery: Secondary | ICD-10-CM | POA: Diagnosis not present

## 2016-12-02 DIAGNOSIS — I214 Non-ST elevation (NSTEMI) myocardial infarction: Secondary | ICD-10-CM

## 2016-12-02 DIAGNOSIS — C162 Malignant neoplasm of body of stomach: Secondary | ICD-10-CM

## 2016-12-02 NOTE — Progress Notes (Signed)
Daily Session Note  Patient Details  Name: [name] MRN: [id number] Date of Birth: 06-08-1951 Referring Provider:     CARDIAC REHAB PHASE II ORIENTATION from 11/23/2016 in [place]  Referring Provider  Dr. Tamala Julian      Encounter Date: 12/02/2016  Check In:     Session Check In - 12/02/16 0930      Check-In   Location [facility]   Staff Present Russella Dar, MS, EP, [facility], Exercise Physiologist;Hailei Besser Wynetta Emery, RN, BSN;Gregory Cowan, BS, EP, Exercise Physiologist   Supervising physician immediately available to respond to emergencies See telemetry face sheet for immediately available MD   Medication changes reported     No   Fall or balance concerns reported    No   Warm-up and Cool-down Performed as group-led instruction   Resistance Training Performed Yes   VAD Patient? No     VAD patient   Has back up controller? No     Pain Assessment   Currently in Pain? No/denies   Pain Score 0-No pain   Multiple Pain Sites No      Capillary Blood Glucose: No results found for this or any previous visit (from the past 24 hour(s)).    History  Smoking Status  . Never Smoker  Smokeless Tobacco  . Never Used    Goals Met:  Independence with exercise equipment Exercise tolerated well No report of cardiac concerns or symptoms Strength training completed today  Goals Unmet:  Not Applicable  Comments: Check out 1030.   Dr. Kate Sable is Medical Director for [facility] Cardiac and Pulmonary Rehab.

## 2016-12-02 NOTE — Telephone Encounter (Signed)
Wife of patient calling stating that she would like to know the next steps in patient's care. Patient recently had CT Scan done and a decision can be made based on those results. Wife also states patient is currently taking tramadol frequently (4x/day), not eating, losing weight daily, and its getting harder to find fatty tissue for his injections. Wife would like a call back at [phone].

## 2016-12-05 ENCOUNTER — Telehealth: Payer: Self-pay | Admitting: Interventional Cardiology

## 2016-12-05 ENCOUNTER — Encounter (HOSPITAL_COMMUNITY)
Admission: RE | Admit: 2016-12-05 | Discharge: 2016-12-05 | Disposition: A | Discharge status: Home / Self Care | Payer: Medicare Other | Source: Ambulatory Visit | Attending: Interventional Cardiology | Admitting: Interventional Cardiology

## 2016-12-05 ENCOUNTER — Ambulatory Visit: Payer: Medicare Other | Admitting: Physician Assistant

## 2016-12-05 DIAGNOSIS — Z955 Presence of coronary angioplasty implant and graft: Secondary | ICD-10-CM

## 2016-12-05 DIAGNOSIS — I214 Non-ST elevation (NSTEMI) myocardial infarction: Secondary | ICD-10-CM

## 2016-12-05 NOTE — Telephone Encounter (Signed)
New Message     Returning call about medication change for his bp   Call wife not pt .

## 2016-12-05 NOTE — Telephone Encounter (Signed)
Stop Losartan, lasix and spironolactone Continue Carvedilol.

## 2016-12-05 NOTE — Telephone Encounter (Signed)
Spoke with pt and advised him of recommendations per Dr. Tamala Julian.  Advised pt to call if he notices lots of swelling or becomes SOB.  Pt verbalized understanding and was in agreement with this plan.

## 2016-12-05 NOTE — Telephone Encounter (Signed)
New Message:    Pt  blood pressure is 82/60 and lightheaded. His hand is real weak.

## 2016-12-05 NOTE — Telephone Encounter (Signed)
Spoke with wife and advised her of recommendation per Dr.  Tamala Julian.  Dr. Tamala Julian then asked to speak to wife.  They had lengthy conversation and Dr. Tamala Julian answered all questions.  Wife verbalized understanding and was appreciative for call.

## 2016-12-05 NOTE — Progress Notes (Signed)
Incomplete Session Note  Patient Details  Name: [name] MRN: [id number] Date of Birth: November 04, 1950 Referring Provider:     [place] from 11/23/2016 in [place]  Referring Provider  Dr. Pia Mau Evon Slack did not complete his rehab session.  Patient's b/p was 82/60. He c/o's filling light headed. Informed patient and his wife that his b/p was too low to exercise. Also recommended he see his PCP or go to the ER today. She says she will call his cardiologist before she leaves the hospital to get his recommendation.

## 2016-12-05 NOTE — Telephone Encounter (Signed)
Spoke with pt wife, the patient was not able to exercise at rehab this morning at [facility] because his bp was 82/60. He is lightheaded, denies chest pain or SOB. He has taken all his blood pressure medications this morning. They do not check his bp at home. On Friday they received a call that the patient has stomach cancer and nothing can be done. His fluid intake and eating has decreased because of stomach pain. He has lost 35 lbs since January. He is having some weakness in the left hand and foot but reports it has been that way since the heart attack. There are not other stroke symptoms. The wife would like to talk to dr Tamala Julian, they have give the patient 3 months to live and she wants to discuss his medications because they are planning on traveling. Advised to get the patient home and try to get him to drink as much as possible as his medications will have to wear off before he will feel better today. She voiced understanding. Will forward to dr Tamala Julian to review and advise

## 2016-12-05 NOTE — Telephone Encounter (Signed)
Left message for [name] to call.

## 2016-12-06 ENCOUNTER — Ambulatory Visit (INDEPENDENT_AMBULATORY_CARE_PROVIDER_SITE_OTHER): Payer: Medicare Other | Admitting: Gastroenterology

## 2016-12-06 ENCOUNTER — Other Ambulatory Visit: Payer: Self-pay

## 2016-12-06 ENCOUNTER — Encounter: Payer: Self-pay | Admitting: Gastroenterology

## 2016-12-06 VITALS — BP 100/68 | HR 78 | Ht 73.0 in | Wt 233.0 lb

## 2016-12-06 DIAGNOSIS — C7A092 Malignant carcinoid tumor of the stomach: Secondary | ICD-10-CM

## 2016-12-06 DIAGNOSIS — I255 Ischemic cardiomyopathy: Secondary | ICD-10-CM

## 2016-12-06 DIAGNOSIS — R933 Abnormal findings on diagnostic imaging of other parts of digestive tract: Secondary | ICD-10-CM | POA: Diagnosis not present

## 2016-12-06 NOTE — Patient Instructions (Addendum)
You are scheduled at [facility] 12/09/2016.  Arrive at 745am for 915 EGD. Follow directions given.  Normal BMI (Body Mass Index- based on height and weight) is between 23 and 30. Your BMI today is Body mass index is 30.74 kg/m. [name] Kitchen Please consider follow up  regarding your BMI with your Primary Care Provider.  Thank you

## 2016-12-06 NOTE — Progress Notes (Addendum)
12/06/2016 [name] [id number] [birth year]   HISTORY OF PRESENT ILLNESS:  This is an unfortunate 66 year old male who was feeling absolutely healthy until about 3 months ago. Beginning of January he was found to have massive amounts of pulmonary emboli and DVT's when he complained of some shortness of breath and some arm and leg pain. Subsequently he suffered a stroke and then a MI for which he underwent cardiac cath with drug-eluting stent placement. When they were evaluating the cause of all of this a CT scan showed diffuse abdominal lymphadenopathy consistent with metastatic disease or lymphoma. He saw Dr. [name] with oncology.  Had a PET scan and then subsequently had another CT scan performed last week on April 11. This new CT scan shows a bilobed lesion or two immediately adjacent lesions of the greater curvature of the stomach with lung nodules concerning for metastatic disease and multiple new hepatic metastases with interval progression of the abdominal lymphadenopathy. The case was forwarded to Dr. Loletha Carrow who has agreed to perform an EGD for biopsy this week. Patient will remain on his Plavix. He will hold his dose of Lovenox the morning of the procedure. The patient denies any GI complaints up until all of this began three months ago. Now he does have some abdominal pains that are manageable with tramadol, but no feeling of fullness, nausea, etc.   Past Medical History:  Diagnosis Date  . CAD in native artery    a. STEMI 09/2016 s/p DES to mLAD, residual 65-75% OM1 disease treated medically. EF 30-35% by cath, 35-40% by echo at dc.  . Chronic systolic CHF (congestive heart failure) ([place])    a. Dx 09/2016 - EF 30-35% by cath, 35-40% by echo.  . Depression   . Dilatation of aorta ([place])    a. CT 09/2016: dilation of the ascending thoracic aorta measuring 4.4 cm in maximum diameter, recommend f/u 2021 if appropriate.  [name] Kitchen DVT (deep venous thrombosis) ([place])    a. Bilat DVT noted 08/2016.    . Essential hypertension   . GERD (gastroesophageal reflux disease)   . 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 History of stroke    a. 09/2016.  [name] Kitchen Hypercoagulable state ([place])   . Hyperlipidemia   . Hyponatremia   . Ischemic cardiomyopathy   . Lymphadenopathy    a. Abd lymphadenopathy concerning for malignancy noted in 09/2016.  [name] Kitchen Normocytic anemia   . PE (pulmonary thromboembolism) ([place])    a. Bilat PE 08/2016.  . Stroke ([place])   . Suspected sleep apnea    Past Surgical History:  Procedure Laterality Date  . adnoidectomy    . APPENDECTOMY    . CORONARY STENT INTERVENTION N/A 10/11/2016   Procedure: Coronary Stent Intervention;  Surgeon: Belva Crome, MD;  Location: [facility];  Service: Cardiovascular;  Laterality: N/A;  . LEFT HEART CATH AND CORONARY ANGIOGRAPHY N/A 10/11/2016   Procedure: Left Heart Cath and Coronary Angiography;  Surgeon: Belva Crome, MD;  Location: [facility];  Service: Cardiovascular;  Laterality: N/A;  . PROSTATE SURGERY    . TONSILLECTOMY AND ADENOIDECTOMY      reports that he has never smoked. He has never used smokeless tobacco. He reports that he drinks alcohol. He reports that he does not use drugs. family history includes AAA (abdominal aortic aneurysm) in his mother; COPD in his mother; Depression in his father; Diabetes in his brother and father; Early death in  his brother; Heart disease in his brother, brother, father, maternal grandfather, maternal grandmother, and mother; Stroke in his paternal grandmother. No Known Allergies    Outpatient Encounter Prescriptions as of 12/06/2016  Medication Sig  . atorvastatin (LIPITOR) 80 MG tablet Take 1 tablet (80 mg total) by mouth daily. (Patient taking differently: Take 80 mg by mouth every evening. )  . carvedilol (COREG) 6.25 MG tablet Take 1 tablet (6.25 mg total) by mouth 2 (two) times daily.  . clopidogrel (PLAVIX) 75 MG tablet Take 1 tablet (75 mg total) by mouth daily.  [name] Kitchen  enoxaparin (LOVENOX) 100 MG/ML injection Inject 1 mL (100 mg total) into the skin every 12 (twelve) hours.  [name] Kitchen escitalopram (LEXAPRO) 20 MG tablet Take 1 tablet (20 mg total) by mouth daily.  . multivitamin-lutein (OCUVITE-LUTEIN) CAPS capsule Take 1 capsule by mouth daily.  . nitroGLYCERIN (NITROSTAT) 0.4 MG SL tablet Place 1 tablet (0.4 mg total) under the tongue every 5 (five) minutes as needed for chest pain (up to 3 doses).  . pantoprazole (PROTONIX) 40 MG tablet Take 1 tablet (40 mg total) by mouth daily.  . traMADol (ULTRAM) 50 MG tablet Take 50 mg by mouth 4 (four) times daily as needed for moderate pain.   No facility-administered encounter medications on file as of 12/06/2016.      REVIEW OF SYSTEMS  : All other systems reviewed and negative except where noted in the History of Present Illness.   PHYSICAL EXAM: BP 100/68   Pulse 78   Ht 6\' 1"  (1.854 m)   Wt 233 lb (105.7 kg)   BMI 30.74 kg/m  General: Well developed white male in no acute distress Head: Normocephalic and atraumatic Eyes:  Sclerae anicteric, conjunctiva pink. Ears: Normal auditory acuity Neck: Supple, no masses.  Lungs: Clear throughout to auscultation; no increased WOB. Heart: Regular rate and rhythm Abdomen: Soft, non-distended. Normal bowel sounds.  Non-tender. Musculoskeletal: Symmetrical with no gross deformities  Skin: No lesions on visible extremities.  Appears pale. Extremities: No edema  Neurological: Alert oriented x 4, grossly non-focal Psychological:  Alert and cooperative. 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 -Recent massive PEs and DVTs, on Lovenox -Recent CVA -Recent MI with cardiac cath and drug-eluting stent placement, on Plavix  *Dr. Loletha Carrow will perform an EGD for biopsy this week on 4/20 at [facility].  Patient will remain on his Plavix. He will hold his dose of Lovenox the morning of the procedure.  Patient and his wife are aware of the risks of the procedure specifically in light of all of his recent cardiac events, etc. They're willing to accept these risks and would like a tissue diagnosis so that they can try whatever they can to treat this malignancy.  CC:  Dettinger, Fransisca Kaufmann, MD CC:  Dr. [name] CC:  Dr. Daneen Schick   Thank you for sending this case to me. I have reviewed the entire note, and I previously reviewed his labs and CT images. This is the plan we discussed.   Wilfrid Lund, MD

## 2016-12-07 ENCOUNTER — Encounter (HOSPITAL_COMMUNITY)
Admission: RE | Admit: 2016-12-07 | Discharge: 2016-12-07 | Disposition: A | Discharge status: Home / Self Care | Payer: Medicare Other | Source: Ambulatory Visit | Attending: Interventional Cardiology | Admitting: Interventional Cardiology

## 2016-12-07 ENCOUNTER — Telehealth: Payer: Self-pay | Admitting: Family Medicine

## 2016-12-07 DIAGNOSIS — Z955 Presence of coronary angioplasty implant and graft: Secondary | ICD-10-CM | POA: Diagnosis not present

## 2016-12-07 DIAGNOSIS — E785 Hyperlipidemia, unspecified: Secondary | ICD-10-CM

## 2016-12-07 DIAGNOSIS — I2102 ST elevation (STEMI) myocardial infarction involving left anterior descending coronary artery: Secondary | ICD-10-CM | POA: Diagnosis not present

## 2016-12-07 DIAGNOSIS — I214 Non-ST elevation (NSTEMI) myocardial infarction: Secondary | ICD-10-CM

## 2016-12-07 MED ORDER — ATORVASTATIN CALCIUM 80 MG PO TABS: 80.0000 mg | ORAL_TABLET | Freq: Every evening | ORAL | 0 refills | Status: DC

## 2016-12-07 NOTE — Telephone Encounter (Signed)
What is the name of the medication? ATORVASTATIN  Have you contacted your pharmacy to request a refill? No, he was using mail order pharmacy and wants to change to [place] would you like this sent to? [facility]   Patient notified that their request is being sent to the clinical staff for review and that they should receive a call once it is complete. If they do not receive a call within 24 hours they can check with their pharmacy or our office.

## 2016-12-07 NOTE — Progress Notes (Signed)
Daily Session Note  Patient Details  Name: [name] MRN: [id number] Date of Birth: 05/18/1951 Referring Provider:     [place] from 11/23/2016 in [place]  Referring Provider  Dr. Tamala Julian      Encounter Date: 12/07/2016  Check In:     Session Check In - 12/07/16 0930      Check-In   Location [facility]   Staff Present Aundra Dubin, RN, BSN;Gregory Luther Parody, BS, EP, Exercise Physiologist   Supervising physician immediately available to respond to emergencies See telemetry face sheet for immediately available MD   Medication changes reported     Yes   Comments Cardiologist d/c'd Losartan, Lasix, and Spironolactone Monday 12/05/16.   Fall or balance concerns reported    No   Warm-up and Cool-down Performed as group-led instruction   Resistance Training Performed Yes   VAD Patient? No     Pain Assessment   Currently in Pain? No/denies   Pain Score 0-No pain   Multiple Pain Sites No      Capillary Blood Glucose: No results found for this or any previous visit (from the past 24 hour(s)).    History  Smoking Status  . Never Smoker  Smokeless Tobacco  . Never Used    Goals Met:  Independence with exercise equipment Exercise tolerated well No report of cardiac concerns or symptoms Strength training completed today  Goals Unmet:  Not Applicable  Comments: Check out 1030.   Dr. Kate Sable is Medical Director for [facility].

## 2016-12-07 NOTE — Telephone Encounter (Signed)
done

## 2016-12-08 ENCOUNTER — Encounter (HOSPITAL_COMMUNITY): Payer: Self-pay | Admitting: *Deleted

## 2016-12-08 ENCOUNTER — Encounter (HOSPITAL_COMMUNITY): Admission: RE | Payer: Self-pay | Source: Ambulatory Visit

## 2016-12-08 ENCOUNTER — Ambulatory Visit (HOSPITAL_COMMUNITY): Admission: RE | Admit: 2016-12-08 | Payer: Medicare Other | Source: Ambulatory Visit | Admitting: Gastroenterology

## 2016-12-08 SURGERY — ESOPHAGOGASTRODUODENOSCOPY (EGD) WITH PROPOFOL
Anesthesia: Monitor Anesthesia Care

## 2016-12-08 NOTE — Progress Notes (Signed)
Anesthesia Chart Review: SAME DAY WORK-UP.   Patient is a 66 year old male scheduled for EGD on 12/09/16 (9:15 AM) by Dr. Loletha Carrow.   In summary, he was in his usual state of health until he presented with BLE DVT and PE 1/82/99, then had embolic CVA 3/71/69 and then anterior STEMI 10/11/16 (while on Eliquis) s/p LAD stent. Evaluation for etiology of hypercoagulable events included a CT scan that showed diffuse abdominal lymphadenopathy consistent with metastatic disease or lymphoma. He has a bilobed lesion or two immediately adjacent lesions of the greater curvature of the stomach and evidence of liver metastases. The case was forwarded to Dr. Loletha Carrow who has agreed to perform an EGD for biopsy this week. Patient will remain on his Plavix. He will hold his dose of Lovenox the morning of the procedure.   History includes never smoker, CAD with anteroapical STEMI 10/11/16 s/p mid LAD Synergy stent, ischemic cardiomyopathy, chronic systolic CHF, ascending TAA, hypercoaguable state, BLE DVT 09/10/16, bilateral PE (moderate to severe clot burden, no evidence of right heart strain) 09/10/16, CVA 09/26/16, HTN, HLD, GERD, depression, severe OSA 10/30/16, abdominal lymphadenopathy with evidence of metastasis 09/2016, normocytic anemia, hyponatremia, appendectomy, T&A.   - PCP is Dr. Vonna Kotyk Dettinger. - Cardiologist is Dr. Daneen Schick, last visit 10/24/16. Patient is on Plavix for DES (ASA disontinued 4 weeks after stent to decrease risk of bleeding). He is on Lovenox for DVT/PE. If biopsy needed before six months from stenting then, he recommended at least 3 months of antiplatelet therapy prior to brief discontinuation for tissue diagnosis. (Based on communication in [place], it appears that Dr. Tamala Julian was notified of sooner plans for biopsy. See 11/16/16 notation.) - HEM-ONC is Dr. Sullivan Lone, last visit 11/16/16. See his detailed assessment/plan. Also by notes, hypercoagulable status likely related to his suspected malignancy.  Some family history of clotting issues in his mother per wife but negative factor V Leiden and prothrombin gene mutation. Negative workup for antiphospholipid antibody syndrome.  - Neurologist is Dr. Phillips Odor.  Meds include Lipitor, Coreg, Plavix, Lovenox, Lexapro, nitroglycerin, Protonix, tramadol.  EKG 10/24/16: NSR, right BBB, LAFB, bifascicular block, anteroseptal infarct (age undetermined), T wave abnormality, consider lateral ischemia.  Echo 10/17/16: Study Conclusions - Left ventricle: Septal apical and inferior wall hypokinesis The   cavity size was moderately dilated. Wall thickness was normal.   Systolic function was moderately reduced. The estimated ejection   fraction was in the range of 35% to 40%. Doppler parameters are   consistent with both elevated ventricular end-diastolic filling   pressure and elevated left atrial filling pressure. - Aortic valve: There was mild regurgitation. - Mitral valve: There was mild regurgitation. - Atrial septum: No defect or patent foramen ovale was identified. (Comparison 10/12/16 limited echo EF 30-35%, severe hypokinesis apicalanteroseptal, anterior, anterolateral, inferior, and apical myocardium; consistent with infarction in the distribution of the left anterior descending coronary artery; 09/26/16 echo: EF 60-65%, no regional wall motion abnormalities.)  Cardiac cath 10/11/16:  Anteroapical ST elevation myocardial infarction due to occlusion of the mid LAD.  Successful PTCA and stent implantation using a 3.0 x 16 Synergy post dilated to 3.5 mm in diameter. 100% stenosis reduced to 0% with TIMI grade 3 flow.  65-75% proximal first obtuse marginal.  Otherwise widely patent circumflex and right coronary arteries.  Significant reduction in LV function with marked elevation in LVEDP (greater than 40 mmHg). Estimated EF 30-35%. RECOMMENDATIONS:  Complicated patient with DVT and pulmonary embolus within the past 2 months  and multiple  embolic strokes within the past 3 weeks. The patient is on chronic anticoagulation with Eliquis. Last dose of anticoagulation was given at 7 PM.  Recommend resuming Eliquis at 7 PM this evening.  Aspirin and Plavix combination for 4 weeks then dropped aspirin.  Aggressive risk factor modification.  Carotid U/S 09/26/16: IMPRESSION: Minimal atherosclerotic disease in the carotid arteries. No significant carotid artery stenosis. Estimated degree of stenosis in the internal carotid arteries is less than 50% bilaterally. Patent vertebral arteries with antegrade flow.  CTA Chest/Aorta 09/27/16: IMPRESSION: 1. Mild diffuse fusiform dilation of the ascending thoracic aorta measuring 4.4 cm in maximum diameter. Recommend followup by ultrasound in 3 years. This recommendation follows ACR consensus guidelines: White Paper of the ACR Incidental Findings Committee II on Vascular Findings. J Am Coll Radiol 2013; 25:366-440 2. 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 3. Left lower lobe airspace consolidation, atelectasis or hypoperfusion changes. Nodular left pleural thickening. This may represent loculated pleural effusion, however pleural metastatic disease cannot be excluded. 4. Soft tissue masses in gastrohepatic ligament, porta hepaticus and retroperitoneum of the upper abdomen, which may represent lymphadenopathy. Further evaluation with abdomen and pelvis CT with contrast is recommended. 5. Periesophageal lymph nodes versus varices.  CTA abd/pelvis 11/30/16:   IMPRESSION: 1. Better distention of the stomach today shows a bilobed lesion or two immediately adjacent lesions of the greater curvature of the stomach, in the region the abnormal PET activity seen on PET-CT. 2. 1.6 cm nodule posterior left lung base concerning for metastatic disease, appearing new in the interval. 3. Multiple new hepatic  metastases. 4. Interval progression of gastrohepatic, hepatoduodenal, and retroperitoneal necrotic lymphadenopathy consistent with metastatic disease. 5. Interval development of retroperitoneal edema around the aorta extending down into both pelvic sidewalls, presumably secondary to the retroperitoneal lymphadenopathy. 6.  Abdominal Aortic Atherosclerois (ICD10-170.0)  PET scan 10/10/16: IMPRESSION: 1. Hypermetabolic gastrohepatic ligament lymph nodes, gastrosplenic lymph nodes, porta hepatis lymph nodes and distal paraesophageal lymph nodes consistent with metastatic lymph nodes. 2. Two foci within the wall of the stomach along the posterior aspect of the greater curvature. 3. Differential for above findings include gastric carcinoma, esophageal carcinoma, gastrointestinal stromal tumor, or lymphoma. 4. Recommend upper endoscopy / EUS with sampling of the perigastric lymph nodes and potentially gastric wall lesions if identifiable.  Sleep study 10/30/16: IMPRESSIONS: - Severe obstructive sleep apnea occurred during the diagnostic portion of the study (AHI = 69.5/hour). An optimal CPAP 12 was selected for this patient ( 12 cm of water).  Labs on arrival tomorrow per GI.   Reviewed above with anesthesiologist Dr. Conrad [place]. Anesthesiologist to evaluation on the day of his procedure.  George Hugh [facility] Stay [facility]/Anesthesiology Phone [phone] 12/08/2016 4:05 PM

## 2016-12-08 NOTE — Anesthesia Preprocedure Evaluation (Addendum)
Anesthesia Evaluation  Patient identified by MRN, date of birth, ID band Patient awake    Reviewed: Allergy & Precautions, NPO status , Patient's Chart, lab work & pertinent test results  History of Anesthesia Complications Negative for: history of anesthetic complications  Airway Mallampati: II  TM Distance: >3 FB Neck ROM: Full    Dental  (+) Teeth Intact   Pulmonary sleep apnea ,    breath sounds clear to auscultation       Cardiovascular hypertension, + angina + CAD, + Past MI, + Peripheral Vascular Disease and +CHF   Rhythm:Regular     Neuro/Psych PSYCHIATRIC DISORDERS Depression CVA    GI/Hepatic GERD  ,  Endo/Other    Renal/GU      Musculoskeletal   Abdominal   Peds  Hematology  (+) anemia ,   Anesthesia Other Findings   Reproductive/Obstetrics                                                             Anesthesia Evaluation    Airway        Dental   Pulmonary           Cardiovascular hypertension,      Neuro/Psych    GI/Hepatic   Endo/Other    Renal/GU      Musculoskeletal   Abdominal   Peds  Hematology   Anesthesia Other Findings   Reproductive/Obstetrics                             Anesthesia Physical Anesthesia Plan  ASA:   Anesthesia Plan:    Post-op Pain Management:    Induction:   Airway Management Planned:   Additional Equipment:   Intra-op Plan:   Post-operative Plan:   Informed Consent:   Plan Discussed with:   Anesthesia Plan Comments: (See my anesthesia note for history and testing summaries. Myra Gianotti, PA-C)        Anesthesia Quick Evaluation  Anesthesia Physical Anesthesia Plan  ASA: III  Anesthesia Plan: MAC   Post-op Pain Management:    Induction: Intravenous  Airway Management Planned: Natural Airway, Nasal Cannula and Simple Face Mask  Additional Equipment:    Intra-op Plan:   Post-operative Plan:   Informed Consent: I have reviewed the patients History and Physical, chart, labs and discussed the procedure including the risks, benefits and alternatives for the proposed anesthesia with the patient or authorized representative who has indicated his/her understanding and acceptance.   Dental advisory given  Plan Discussed with: CRNA and Surgeon  Anesthesia Plan Comments: (See my anesthesia note for history and testing summaries. Myra Gianotti, PA-C)       Anesthesia Quick Evaluation

## 2016-12-08 NOTE — Progress Notes (Signed)
Pt denies any acute cardiopulmonary issues. Pt under the care of dr. Tamala Julian, Cardiology. Pt made aware to stop taking vitamins, fish oil, and herbal medications. Do not take any NSAIDs ie: Ibuprofen, Advil, Naproxen, BC and Goody Powder. Pt stated that he was instructed by the MD to take his last dose of Lovenox tonight at 6:00 PM, take Plavix the morning of procedure however, do not  take Protonix. Pt verbalized understanding of all pre-op instructions. Anesthesia asked to review pt cardiac history.

## 2016-12-09 ENCOUNTER — Ambulatory Visit (HOSPITAL_COMMUNITY)
Admission: RE | Admit: 2016-12-09 | Discharge: 2016-12-09 | Disposition: A | Discharge status: Home / Self Care | Payer: Medicare Other | Source: Ambulatory Visit | Attending: Gastroenterology | Admitting: Gastroenterology

## 2016-12-09 ENCOUNTER — Encounter (HOSPITAL_COMMUNITY): Payer: Medicare Other

## 2016-12-09 ENCOUNTER — Ambulatory Visit (HOSPITAL_COMMUNITY): Payer: Medicare Other | Admitting: Vascular Surgery

## 2016-12-09 ENCOUNTER — Encounter (HOSPITAL_COMMUNITY): Payer: Self-pay | Admitting: *Deleted

## 2016-12-09 ENCOUNTER — Encounter (HOSPITAL_COMMUNITY)
Admission: RE | Disposition: A | Discharge status: Home / Self Care | Payer: Self-pay | Source: Ambulatory Visit | Attending: Gastroenterology

## 2016-12-09 DIAGNOSIS — Z833 Family history of diabetes mellitus: Secondary | ICD-10-CM | POA: Diagnosis not present

## 2016-12-09 DIAGNOSIS — E785 Hyperlipidemia, unspecified: Secondary | ICD-10-CM | POA: Diagnosis not present

## 2016-12-09 DIAGNOSIS — Z8481 Family history of carrier of genetic disease: Secondary | ICD-10-CM | POA: Insufficient documentation

## 2016-12-09 DIAGNOSIS — Z9889 Other specified postprocedural states: Secondary | ICD-10-CM | POA: Diagnosis not present

## 2016-12-09 DIAGNOSIS — I5022 Chronic systolic (congestive) heart failure: Secondary | ICD-10-CM | POA: Diagnosis not present

## 2016-12-09 DIAGNOSIS — Z7902 Long term (current) use of antithrombotics/antiplatelets: Secondary | ICD-10-CM | POA: Diagnosis not present

## 2016-12-09 DIAGNOSIS — K3189 Other diseases of stomach and duodenum: Secondary | ICD-10-CM

## 2016-12-09 DIAGNOSIS — K219 Gastro-esophageal reflux disease without esophagitis: Secondary | ICD-10-CM | POA: Diagnosis not present

## 2016-12-09 DIAGNOSIS — I2510 Atherosclerotic heart disease of native coronary artery without angina pectoris: Secondary | ICD-10-CM | POA: Insufficient documentation

## 2016-12-09 DIAGNOSIS — K259 Gastric ulcer, unspecified as acute or chronic, without hemorrhage or perforation: Secondary | ICD-10-CM | POA: Diagnosis not present

## 2016-12-09 DIAGNOSIS — C169 Malignant neoplasm of stomach, unspecified: Secondary | ICD-10-CM | POA: Insufficient documentation

## 2016-12-09 DIAGNOSIS — Z86711 Personal history of pulmonary embolism: Secondary | ICD-10-CM | POA: Diagnosis not present

## 2016-12-09 DIAGNOSIS — C166 Malignant neoplasm of greater curvature of stomach, unspecified: Secondary | ICD-10-CM

## 2016-12-09 DIAGNOSIS — Z955 Presence of coronary angioplasty implant and graft: Secondary | ICD-10-CM | POA: Insufficient documentation

## 2016-12-09 DIAGNOSIS — I110 Hypertensive heart disease with heart failure: Secondary | ICD-10-CM | POA: Diagnosis not present

## 2016-12-09 DIAGNOSIS — I252 Old myocardial infarction: Secondary | ICD-10-CM | POA: Insufficient documentation

## 2016-12-09 DIAGNOSIS — D649 Anemia, unspecified: Secondary | ICD-10-CM | POA: Diagnosis not present

## 2016-12-09 DIAGNOSIS — R933 Abnormal findings on diagnostic imaging of other parts of digestive tract: Secondary | ICD-10-CM

## 2016-12-09 DIAGNOSIS — C7A092 Malignant carcinoid tumor of the stomach: Secondary | ICD-10-CM

## 2016-12-09 DIAGNOSIS — Z8673 Personal history of transient ischemic attack (TIA), and cerebral infarction without residual deficits: Secondary | ICD-10-CM | POA: Diagnosis not present

## 2016-12-09 DIAGNOSIS — Z9049 Acquired absence of other specified parts of digestive tract: Secondary | ICD-10-CM | POA: Insufficient documentation

## 2016-12-09 DIAGNOSIS — Z818 Family history of other mental and behavioral disorders: Secondary | ICD-10-CM | POA: Diagnosis not present

## 2016-12-09 DIAGNOSIS — I255 Ischemic cardiomyopathy: Secondary | ICD-10-CM | POA: Insufficient documentation

## 2016-12-09 DIAGNOSIS — C772 Secondary and unspecified malignant neoplasm of intra-abdominal lymph nodes: Secondary | ICD-10-CM | POA: Insufficient documentation

## 2016-12-09 DIAGNOSIS — Z825 Family history of asthma and other chronic lower respiratory diseases: Secondary | ICD-10-CM | POA: Diagnosis not present

## 2016-12-09 HISTORY — DX: Sleep apnea, unspecified: G47.30

## 2016-12-09 HISTORY — DX: Other symptoms and signs involving the musculoskeletal system: R29.898

## 2016-12-09 HISTORY — DX: Angina pectoris, unspecified: I20.9

## 2016-12-09 HISTORY — PX: ESOPHAGOGASTRODUODENOSCOPY: SHX5428

## 2016-12-09 SURGERY — EGD (ESOPHAGOGASTRODUODENOSCOPY)
Anesthesia: Monitor Anesthesia Care

## 2016-12-09 MED ORDER — PROPOFOL 500 MG/50ML IV EMUL
INTRAVENOUS | Status: DC | PRN
  Administered 2016-12-09: 100 ug/kg/min via INTRAVENOUS

## 2016-12-09 MED ORDER — SODIUM CHLORIDE 0.9 % IV SOLN: INTRAVENOUS | Status: DC

## 2016-12-09 MED ORDER — PROPOFOL 10 MG/ML IV BOLUS
INTRAVENOUS | Status: DC | PRN
  Administered 2016-12-09 (×2): 20 mg via INTRAVENOUS

## 2016-12-09 MED ORDER — ENOXAPARIN SODIUM 100 MG/ML ~~LOC~~ SOLN: 100.0000 mg | Freq: Two times a day (BID) | SUBCUTANEOUS | 1 refills | Status: DC

## 2016-12-09 MED ORDER — LACTATED RINGERS IV SOLN
INTRAVENOUS | Status: DC | PRN
  Administered 2016-12-09: 09:00:00 via INTRAVENOUS

## 2016-12-09 NOTE — Discharge Instructions (Signed)
YOU HAD AN ENDOSCOPIC PROCEDURE TODAY: Refer to the procedure report and other information in the discharge instructions given to you for any specific questions about what was found during the examination. If this information does not answer your questions, please call [place] office at [phone] to clarify.   YOU SHOULD EXPECT: Some feelings of bloating in the abdomen. Passage of more gas than usual. Walking can help get rid of the air that was put into your GI tract during the procedure and reduce the bloating. If you had a lower endoscopy (such as a colonoscopy or flexible sigmoidoscopy) you may notice spotting of blood in your stool or on the toilet paper. Some abdominal soreness may be present for a day or two, also.  DIET: Your first meal following the procedure should be a light meal and then it is ok to progress to your normal diet. A half-sandwich or bowl of soup is an example of a good first meal. Heavy or fried foods are harder to digest and may make you feel nauseous or bloated. Drink plenty of fluids but you should avoid alcoholic beverages for 24 hours. If you had a esophageal dilation, please see attached instructions for diet.    ACTIVITY: Your care partner should take you home directly after the procedure. You should plan to take it easy, moving slowly for the rest of the day. You can resume normal activity the day after the procedure however YOU SHOULD NOT DRIVE, use power tools, machinery or perform tasks that involve climbing or major physical exertion for 24 hours (because of the sedation medicines used during the test).   SYMPTOMS TO REPORT IMMEDIATELY: A gastroenterologist can be reached at any hour. Please call [phone]  for any of the following symptoms:  Following lower endoscopy (colonoscopy, flexible sigmoidoscopy) Excessive amounts of blood in the stool  Significant tenderness, worsening of abdominal pains  Swelling of the abdomen that is new, acute  Fever of 100 or  higher  Following upper endoscopy (EGD, EUS, ERCP, esophageal dilation) Vomiting of blood or coffee ground material  New, significant abdominal pain  New, significant chest pain or pain under the shoulder blades  Painful or persistently difficult swallowing  New shortness of breath  Black, tarry-looking or red, bloody stools  FOLLOW UP:  If any biopsies were taken you will be contacted by phone or by letter within the next 1-3 weeks. Call [phone]  if you have not heard about the biopsies in 3 weeks.  Please also call with any specific questions about appointments or follow up tests. Continue plavix (clopidogrel) at the same dose once daily.  Resume lovenox (enoxaparin) at the same dose tomorrow AM (4/20)

## 2016-12-09 NOTE — Anesthesia Procedure Notes (Signed)
Procedure Name: MAC Date/Time: 12/09/2016 9:34 AM Performed by: Everlean Cherry A Pre-anesthesia Checklist: Patient identified, Emergency Drugs available, Suction available and Patient being monitored Patient Re-evaluated:Patient Re-evaluated prior to inductionOxygen Delivery Method: Nasal cannula

## 2016-12-09 NOTE — H&P (View-Only) (Signed)
12/06/2016 [name] [id number] [birth year]   HISTORY OF PRESENT ILLNESS:  This is an unfortunate 66 year old male who was feeling absolutely healthy until about 3 months ago. Beginning of January he was found to have massive amounts of pulmonary emboli and DVT's when he complained of some shortness of breath and some arm and leg pain. Subsequently he suffered a stroke and then a MI for which he underwent cardiac cath with drug-eluting stent placement. When they were evaluating the cause of all of this a CT scan showed diffuse abdominal lymphadenopathy consistent with metastatic disease or lymphoma. He saw Dr. [name] with oncology.  Had a PET scan and then subsequently had another CT scan performed last week on April 11. This new CT scan shows a bilobed lesion or two immediately adjacent lesions of the greater curvature of the stomach with lung nodules concerning for metastatic disease and multiple new hepatic metastases with interval progression of the abdominal lymphadenopathy. The case was forwarded to Dr. Loletha Carrow who has agreed to perform an EGD for biopsy this week. Patient will remain on his Plavix. He will hold his dose of Lovenox the morning of the procedure. The patient denies any GI complaints up until all of this began three months ago. Now he does have some abdominal pains that are manageable with tramadol, but no feeling of fullness, nausea, etc.   Past Medical History:  Diagnosis Date  . CAD in native artery    a. STEMI 09/2016 s/p DES to mLAD, residual 65-75% OM1 disease treated medically. EF 30-35% by cath, 35-40% by echo at dc.  . Chronic systolic CHF (congestive heart failure) ([place])    a. Dx 09/2016 - EF 30-35% by cath, 35-40% by echo.  . Depression   . Dilatation of aorta ([place])    a. CT 09/2016: dilation of the ascending thoracic aorta measuring 4.4 cm in maximum diameter, recommend f/u 2021 if appropriate.  [name] Kitchen DVT (deep venous thrombosis) ([place])    a. Bilat DVT noted 08/2016.    . Essential hypertension   . GERD (gastroesophageal reflux disease)   . 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 History of stroke    a. 09/2016.  [name] Kitchen Hypercoagulable state ([place])   . Hyperlipidemia   . Hyponatremia   . Ischemic cardiomyopathy   . Lymphadenopathy    a. Abd lymphadenopathy concerning for malignancy noted in 09/2016.  [name] Kitchen Normocytic anemia   . PE (pulmonary thromboembolism) ([place])    a. Bilat PE 08/2016.  . Stroke ([place])   . Suspected sleep apnea    Past Surgical History:  Procedure Laterality Date  . adnoidectomy    . APPENDECTOMY    . CORONARY STENT INTERVENTION N/A 10/11/2016   Procedure: Coronary Stent Intervention;  Surgeon: Belva Crome, MD;  Location: [facility];  Service: Cardiovascular;  Laterality: N/A;  . LEFT HEART CATH AND CORONARY ANGIOGRAPHY N/A 10/11/2016   Procedure: Left Heart Cath and Coronary Angiography;  Surgeon: Belva Crome, MD;  Location: [facility];  Service: Cardiovascular;  Laterality: N/A;  . PROSTATE SURGERY    . TONSILLECTOMY AND ADENOIDECTOMY      reports that he has never smoked. He has never used smokeless tobacco. He reports that he drinks alcohol. He reports that he does not use drugs. family history includes AAA (abdominal aortic aneurysm) in his mother; COPD in his mother; Depression in his father; Diabetes in his brother and father; Early death in  his brother; Heart disease in his brother, brother, father, maternal grandfather, maternal grandmother, and mother; Stroke in his paternal grandmother. No Known Allergies    Outpatient Encounter Prescriptions as of 12/06/2016  Medication Sig  . atorvastatin (LIPITOR) 80 MG tablet Take 1 tablet (80 mg total) by mouth daily. (Patient taking differently: Take 80 mg by mouth every evening. )  . carvedilol (COREG) 6.25 MG tablet Take 1 tablet (6.25 mg total) by mouth 2 (two) times daily.  . clopidogrel (PLAVIX) 75 MG tablet Take 1 tablet (75 mg total) by mouth daily.  [name] Kitchen  enoxaparin (LOVENOX) 100 MG/ML injection Inject 1 mL (100 mg total) into the skin every 12 (twelve) hours.  [name] Kitchen escitalopram (LEXAPRO) 20 MG tablet Take 1 tablet (20 mg total) by mouth daily.  . multivitamin-lutein (OCUVITE-LUTEIN) CAPS capsule Take 1 capsule by mouth daily.  . nitroGLYCERIN (NITROSTAT) 0.4 MG SL tablet Place 1 tablet (0.4 mg total) under the tongue every 5 (five) minutes as needed for chest pain (up to 3 doses).  . pantoprazole (PROTONIX) 40 MG tablet Take 1 tablet (40 mg total) by mouth daily.  . traMADol (ULTRAM) 50 MG tablet Take 50 mg by mouth 4 (four) times daily as needed for moderate pain.   No facility-administered encounter medications on file as of 12/06/2016.      REVIEW OF SYSTEMS  : All other systems reviewed and negative except where noted in the History of Present Illness.   PHYSICAL EXAM: BP 100/68   Pulse 78   Ht 6\' 1"  (1.854 m)   Wt 233 lb (105.7 kg)   BMI 30.74 kg/m  General: Well developed white male in no acute distress Head: Normocephalic and atraumatic Eyes:  Sclerae anicteric, conjunctiva pink. Ears: Normal auditory acuity Neck: Supple, no masses.  Lungs: Clear throughout to auscultation; no increased WOB. Heart: Regular rate and rhythm Abdomen: Soft, non-distended. Normal bowel sounds.  Non-tender. Musculoskeletal: Symmetrical with no gross deformities  Skin: No lesions on visible extremities.  Appears pale. Extremities: No edema  Neurological: Alert oriented x 4, grossly non-focal Psychological:  Alert and cooperative. 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 -Recent massive PEs and DVTs, on Lovenox -Recent CVA -Recent MI with cardiac cath and drug-eluting stent placement, on Plavix  *Dr. Loletha Carrow will perform an EGD for biopsy this week on 4/20 at [facility] hospital.  Patient will remain on his Plavix. He will hold his dose of Lovenox the morning of the procedure.  Patient and his wife are aware of the risks of the procedure specifically in light of all of his recent cardiac events, etc. They're willing to accept these risks and would like a tissue diagnosis so that they can try whatever they can to treat this malignancy.  CC:  Dettinger, Fransisca Kaufmann, MD CC:  Dr. [name] CC:  Dr. Daneen Schick   Thank you for sending this case to me. I have reviewed the entire note, and I previously reviewed his labs and CT images. This is the plan we discussed.   Wilfrid Lund, MD

## 2016-12-09 NOTE — Interval H&P Note (Signed)
History and Physical Interval Note:  12/09/2016 9:24 AM  [name]  has presented today for surgery, with the diagnosis of abnormal CT, lesions in stomach  The various methods of treatment have been discussed with the patient and family. After consideration of risks, benefits and other options for treatment, the patient has consented to  Procedure(s): ESOPHAGOGASTRODUODENOSCOPY (EGD) (N/A) as a surgical intervention .  The patient's history has been reviewed, patient examined, no change in status, stable for surgery.  I have reviewed the patient's chart and labs.  Questions were answered to the patient's satisfaction.     Nelida Meuse III

## 2016-12-09 NOTE — Op Note (Signed)
[facility] Patient Name: Islam Eichinger Procedure Date : 12/09/2016 MRN: [id number] Attending MD: Estill Cotta. Loletha Carrow , MD Date of Birth: 15-Apr-1951 CSN: [id number] Age: 66 Admit Type: Outpatient Procedure:                Upper GI endoscopy Indications:              Abnormal CT of the GI tract demonstrating                            metastatic gastric mass Providers:                Mallie Mussel L. Loletha Carrow, MD, Burtis Junes, RN, Corliss Parish,                            Technician Referring MD:              Medicines:                Monitored Anesthesia Care Complications:            No immediate complications. Estimated Blood Loss:     Estimated blood loss: 10 mL. Procedure:                Pre-Anesthesia Assessment:                           - Prior to the procedure, a History and Physical                            was performed, and patient medications and                            allergies were reviewed. The patient's tolerance of                            previous anesthesia was also reviewed. The risks                            and benefits of the procedure and the sedation                            options and risks were discussed with the patient.                            All questions were answered, and informed consent                            was obtained. Prior Anticoagulants: The patient has                            taken Plavix (clopidogrel), last dose was day of                            procedure. After reviewing the risks and benefits,  the patient was deemed in satisfactory condition to                            undergo the procedure.                           - Prior to the procedure, a History and Physical                            was performed, and patient medications and                            allergies were reviewed. The patient's tolerance of                            previous anesthesia was also reviewed. The risks                             and benefits of the procedure and the sedation                            options and risks were discussed with the patient.                            All questions were answered, and informed consent                            was obtained. Prior Anticoagulants: The patient has                            taken Lovenox (enoxaparin), last dose was the                            evening prior to procedure. (recent LAD DES for                            acute MI as well as bilateral PEs) ASA Grade                            Assessment: III - A patient with severe systemic                            disease. After reviewing the risks and benefits,                            the patient was deemed in satisfactory condition to                            undergo the procedure.                           After obtaining informed consent, the endoscope was  passed under direct vision. Throughout the                            procedure, the patient's blood pressure, pulse, and                            oxygen saturations were monitored continuously. The                            [id number] ([id number]) scope was introduced through the                            mouth, and advanced to the second part of duodenum.                            The upper GI endoscopy was accomplished without                            difficulty. The patient tolerated the procedure                            well. Scope In: Scope Out: Findings:      The larynx was normal.      The esophagus was normal.      A large,ulcerated, submucosal mass with no bleeding and stigmata of       recent bleeding was found on the greater curvature of the distal gastric       fundus/proximal gastric body.      Part of the proximal surface appeared to have mucosal involvement, so       most of the biopsies were taken from that area. Additional biopsies were       taken in a  "successive-bite" fashion from a more distal part of the mass.      Naturally, the biopsy site bled due to the patient's medicines. However,       the bleeding stopped without intervention.      Biopsies were taken from the mass with a cold forceps for histology.      Retroflexion was perfomed in the stomach.      The examined duodenum was normal. Impression:               - Normal larynx.                           - Normal esophagus.                           - Likely malignant gastric tumor on the greater                            curvature of the gastric body. Biopsied.                           - Normal examined duodenum. Moderate Sedation:      MAC sedation used Recommendation:           - Patient has a contact number available for  emergencies. The signs and symptoms of potential                            delayed complications were discussed with the                            patient. Return to normal activities tomorrow.                            Written discharge instructions were provided to the                            patient.                           - Resume previous diet.                           - Resume Lovenox (enoxaparin) at prior dose                            tomorrow morning.                           - Await pathology results. If they are                            non-diagnostic, the patient will have to undergo                            EUS-guided FNA in the near future. Procedure Code(s):        --- Professional ---                           [phone], Esophagogastroduodenoscopy, flexible,                            transoral; with biopsy, single or multiple Diagnosis Code(s):        --- Professional ---                           D49.0, Neoplasm of unspecified behavior of                            digestive system                           R93.3, Abnormal findings on diagnostic imaging of                            other parts  of digestive tract CPT copyright 2016 [employer]. All rights reserved. The codes documented in this report are preliminary and upon coder review may  be revised to meet current compliance requirements. Henry L. Loletha Carrow, MD 12/09/2016 10:04:06 AM This report has been signed electronically. Number of Addenda: 0

## 2016-12-09 NOTE — Anesthesia Postprocedure Evaluation (Signed)
Anesthesia Post Note  Patient: [name]  Procedure(s) Performed: Procedure(s) (LRB): ESOPHAGOGASTRODUODENOSCOPY (EGD) (N/A)  Patient location during evaluation: PACU Anesthesia Type: MAC Level of consciousness: awake and alert Pain management: pain level controlled Vital Signs Assessment: post-procedure vital signs reviewed and stable Respiratory status: spontaneous breathing, nonlabored ventilation, respiratory function stable and patient connected to nasal cannula oxygen Cardiovascular status: stable and blood pressure returned to baseline Anesthetic complications: no       Last Vitals:  Vitals:   12/09/16 1040 12/09/16 1055  BP: 119/80 104/79  Pulse: 67 67  Resp: 18 (!) 23  Temp:      Last Pain:  Vitals:   12/09/16 0955  TempSrc: Oral                 [name]

## 2016-12-09 NOTE — Transfer of Care (Signed)
Immediate Anesthesia Transfer of Care Note  Patient: [name]  Procedure(s) Performed: Procedure(s): ESOPHAGOGASTRODUODENOSCOPY (EGD) (N/A)  Patient Location: Endoscopy Unit  Anesthesia Type:MAC  Level of Consciousness: awake, alert , oriented and patient cooperative  Airway & Oxygen Therapy: Patient Spontanous Breathing and Patient connected to nasal cannula oxygen  Post-op Assessment: Report given to RN and Post -op Vital signs reviewed and stable  Post vital signs: Reviewed and stable  Last Vitals:  Vitals:   12/09/16 0710 12/09/16 0955  BP: 112/78 (!) 94/59  Pulse: 69 70  Resp: 13 20  Temp: 36.7 C     Last Pain:  Vitals:   12/09/16 0955  TempSrc: Oral         Complications: No apparent anesthesia complications

## 2016-12-11 ENCOUNTER — Encounter (HOSPITAL_COMMUNITY): Payer: Self-pay | Admitting: Gastroenterology

## 2016-12-12 ENCOUNTER — Telehealth: Payer: Self-pay

## 2016-12-12 ENCOUNTER — Telehealth: Payer: Self-pay | Admitting: Gastroenterology

## 2016-12-12 ENCOUNTER — Other Ambulatory Visit: Payer: Self-pay

## 2016-12-12 ENCOUNTER — Encounter (HOSPITAL_COMMUNITY): Payer: Medicare Other

## 2016-12-12 MED ORDER — HYDROCODONE-ACETAMINOPHEN 5-325 MG PO TABS: 1.0000 | ORAL_TABLET | Freq: Four times a day (QID) | ORAL | 0 refills | Status: DC | PRN

## 2016-12-12 NOTE — Telephone Encounter (Signed)
-----   Message from Irven Baltimore sent at 12/12/2016  9:12 AM EDT ----- Please call Dr. Saralyn Pilar at [facility]. [phone]

## 2016-12-12 NOTE — Telephone Encounter (Signed)
Patient of Dr. Loletha Carrow, routed message to Dr. Ardis Hughs as DOD, patient's wife reports that he had severe abdominal pain starting yesterday on 4/22, had to take 5 tramadol's throughout day. He has already taken one tablet today. She states he is not in as much pain as yesterday, just very weak. Denies any vomiting or blood in his stool. He is constipated, taking OTC miralax once daily. Please advise.

## 2016-12-12 NOTE — Telephone Encounter (Signed)
He has a metastatic process.  Ask if he has narcotic pain meds available.  If not, please prescribe vicodin (5/325), disp 50, take one to two pills every 5-6 hours as needed for pain. No refills.  thanks

## 2016-12-12 NOTE — Telephone Encounter (Signed)
Spoke with Dr. 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 Path results should be released today (Monday).

## 2016-12-12 NOTE — Progress Notes (Signed)
Cardiac Individual Treatment Plan  Patient Details  Name: [name] MRN: [id number] Date of Birth: [birth year] Referring Provider:     CARDIAC REHAB PHASE II ORIENTATION from 11/23/2016 in [place]  Referring Provider  Dr. Tamala Julian      Initial Encounter Date:    CARDIAC REHAB PHASE II ORIENTATION from 11/23/2016 in [place]  Date  11/23/16  Referring Provider  Dr. Tamala Julian      Visit Diagnosis: NSTEMI (non-ST elevated myocardial infarction) [facility])  Status post coronary artery stent placement  Patient's Home Medications on Admission:  Current Outpatient Prescriptions:  .  atorvastatin (LIPITOR) 80 MG tablet, Take 1 tablet (80 mg total) by mouth every evening., Disp: 90 tablet, Rfl: 0 .  carvedilol (COREG) 6.25 MG tablet, Take 1 tablet (6.25 mg total) by mouth 2 (two) times daily., Disp: 180 tablet, Rfl: 3 .  clopidogrel (PLAVIX) 75 MG tablet, Take 1 tablet (75 mg total) by mouth daily., Disp: 30 tablet, Rfl: 1 .  enoxaparin (LOVENOX) 100 MG/ML injection, Inject 1 mL (100 mg total) into the skin every 12 (twelve) hours., Disp: 60 Syringe, Rfl: 1 .  escitalopram (LEXAPRO) 20 MG tablet, Take 1 tablet (20 mg total) by mouth daily., Disp: 90 tablet, Rfl: 2 .  HYDROcodone-acetaminophen (NORCO/VICODIN) 5-325 MG tablet, Take 1-2 tablets by mouth every 6 (six) hours as needed for moderate pain., Disp: 50 tablet, Rfl: 0 .  multivitamin-lutein (OCUVITE-LUTEIN) CAPS capsule, Take 1 capsule by mouth daily., Disp: , Rfl:  .  nitroGLYCERIN (NITROSTAT) 0.4 MG SL tablet, Place 1 tablet (0.4 mg total) under the tongue every 5 (five) minutes as needed for chest pain (up to 3 doses)., Disp: 25 tablet, Rfl: 3 .  pantoprazole (PROTONIX) 40 MG tablet, Take 1 tablet (40 mg total) by mouth daily., Disp: 30 tablet, Rfl: 1 .  traMADol (ULTRAM) 50 MG tablet, Take 50 mg by mouth 4 (four) times daily as needed for moderate pain., Disp: , Rfl:   Past Medical  History: Past Medical History:  Diagnosis Date  . Anginal pain ([place])   . CAD in native artery    a. STEMI 09/2016 s/p DES to mLAD, residual 65-75% OM1 disease treated medically. EF 30-35% by cath, 35-40% by echo at dc.  . Chronic systolic CHF (congestive heart failure) ([place])    a. Dx 09/2016 - EF 30-35% by cath, 35-40% by echo.  . Depression   . Dilatation of aorta ([place])    a. CT 09/2016: dilation of the ascending thoracic aorta measuring 4.4 cm in maximum diameter, recommend f/u 2021 if appropriate.  [name] Kitchen DVT (deep venous thrombosis) ([place])    a. Bilat DVT noted 08/2016.  . Essential hypertension   . GERD (gastroesophageal reflux disease)   . 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 History of stroke    a. 09/2016.  [name] Kitchen Hypercoagulable state ([place])   . Hyperlipidemia   . Hyponatremia   . Ischemic cardiomyopathy   . Lymphadenopathy    a. Abd lymphadenopathy concerning for malignancy noted in 09/2016.  [name] Kitchen Normocytic anemia   . PE (pulmonary thromboembolism) ([place])    a. Bilat PE 08/2016.  . Right leg weakness    secondary to stroke  . Sleep apnea   . Stroke ([place])   . Suspected sleep apnea     Tobacco Use: History  Smoking Status  . Never Smoker  Smokeless Tobacco  . Never Used    Labs: Recent Review Flowsheet  Data    Labs for ITP Cardiac and Pulmonary Rehab Latest Ref Rng & Units 07/25/2016 09/26/2016 10/11/2016   Cholestrol 0 - 200 mg/dL 134 95 131   LDLCALC 0 - 99 mg/dL 73 49 68   HDL >40 mg/dL 41 29(L) 37(L)   Trlycerides <150 mg/dL 98 86 131   Hemoglobin A1c 4.8 - 5.6 % - 5.3 -      Capillary Blood Glucose: Lab Results  Component Value Date   GLUCAP 129 (H) 10/15/2016   GLUCAP 106 (H) 10/10/2016   GLUCAP 101 (H) 09/25/2016   GLUCAP 105 (H) 09/14/2016   GLUCAP 115 (H) 09/13/2016     Exercise Target Goals:    Exercise Program Goal: Individual exercise prescription set with THRR, safety & activity barriers. Participant demonstrates ability to understand and report RPE  using BORG scale, to self-measure pulse accurately, and to acknowledge the importance of the exercise prescription.  Exercise Prescription Goal: Starting with aerobic activity 30 plus minutes a day, 3 days per week for initial exercise prescription. Provide home exercise prescription and guidelines that participant acknowledges understanding prior to discharge.  Activity Barriers & Risk Stratification:     Activity Barriers & Cardiac Risk Stratification - 11/23/16 1219      Activity Barriers & Cardiac Risk Stratification   Activity Barriers Other (comment)   Comments Has had a stroke that has affected his right leg. Mobility is fair to good.    Cardiac Risk Stratification High      6 Minute Walk:     6 Minute Walk    Row Name 11/23/16 1205         6 Minute Walk   Phase Initial     Distance 1200 feet     Distance % Change 0 %     Walk Time 6 minutes     # of Rest Breaks 0     MPH 2.27     METS 2.74     RPE 15     Perceived Dyspnea  11     VO2 Peak 9.57     Symptoms No     Resting HR 69 bpm     Resting BP 92/60     Max Ex. HR 104 bpm     Max Ex. BP 118/68     2 Minute Post BP 106/64        Oxygen Initial Assessment:   Oxygen Re-Evaluation:   Oxygen Discharge (Final Oxygen Re-Evaluation):   Initial Exercise Prescription:     Initial Exercise Prescription - 11/23/16 1200      Date of Initial Exercise RX and Referring Provider   Date 11/23/16   Referring Provider Dr. Tamala Julian     NuStep   Level 2   SPM 17   Minutes 15   METs 1.9     Recumbant Elliptical   Level 1   RPM 31   Watts 21   Minutes 20   METs 1.4     Prescription Details   Frequency (times per week) 3   Duration Progress to 30 minutes of continuous aerobic without signs/symptoms of physical distress     Intensity   THRR 40-80% of Max Heartrate [phone]   Ratings of Perceived Exertion 11-13   Perceived Dyspnea 0-4     Progression   Progression Continue progressive overload as  per policy without signs/symptoms or physical distress.     Resistance Training   Training Prescription Yes   Weight 1   Reps  10-15      Perform Capillary Blood Glucose checks as needed.  Exercise Prescription Changes:      Exercise Prescription Changes    Row Name 12/12/16 1400             Response to Exercise   Blood Pressure (Admit) 92/62       Blood Pressure (Exercise) 102/62       Blood Pressure (Exit) 88/60       Heart Rate (Admit) 75 bpm       Heart Rate (Exercise) 86 bpm       Heart Rate (Exit) 79 bpm       Rating of Perceived Exertion (Exercise) 11       Duration Progress to 30 minutes of  aerobic without signs/symptoms of physical distress       Intensity THRR unchanged         Progression   Progression Continue to progress workloads to maintain intensity without signs/symptoms of physical distress.         Resistance Training   Training Prescription Yes       Weight 1       Reps 10-15         NuStep   Level 1       SPM 11       Minutes 15       METs 3.69         Arm Ergometer   Level 1       Watts 1       Minutes 20       METs 1         Home Exercise Plan   Plans to continue exercise at Home (comment)       Frequency Add 1 additional day to program exercise sessions.          Exercise Comments:      Exercise Comments    Row Name 12/12/16 1417           Exercise Comments Patient has not progressed in Cr and has been placed on arm ergometer due to illness.           Exercise Goals and Review:      Exercise Goals    Row Name 11/23/16 1221             Exercise Goals   Increase Physical Activity Yes       Intervention Provide advice, education, support and counseling about physical activity/exercise needs.;Develop an individualized exercise prescription for aerobic and resistive training based on initial evaluation findings, risk stratification, comorbidities and participant's personal goals.       Expected Outcomes  Achievement of increased cardiorespiratory fitness and enhanced flexibility, muscular endurance and strength shown through measurements of functional capacity and personal statement of participant.       Increase Strength and Stamina Yes       Intervention Develop an individualized exercise prescription for aerobic and resistive training based on initial evaluation findings, risk stratification, comorbidities and participant's personal goals.;Provide advice, education, support and counseling about physical activity/exercise needs.          Exercise Goals Re-Evaluation :    Discharge Exercise Prescription (Final Exercise Prescription Changes):     Exercise Prescription Changes - 12/12/16 1400      Response to Exercise   Blood Pressure (Admit) 92/62   Blood Pressure (Exercise) 102/62   Blood Pressure (Exit) 88/60   Heart Rate (Admit) 75 bpm   Heart  Rate (Exercise) 86 bpm   Heart Rate (Exit) 79 bpm   Rating of Perceived Exertion (Exercise) 11   Duration Progress to 30 minutes of  aerobic without signs/symptoms of physical distress   Intensity THRR unchanged     Progression   Progression Continue to progress workloads to maintain intensity without signs/symptoms of physical distress.     Resistance Training   Training Prescription Yes   Weight 1   Reps 10-15     NuStep   Level 1   SPM 11   Minutes 15   METs 3.69     Arm Ergometer   Level 1   Watts 1   Minutes 20   METs 1     Home Exercise Plan   Plans to continue exercise at Home (comment)   Frequency Add 1 additional day to program exercise sessions.      Nutrition:  Target Goals: Understanding of nutrition guidelines, daily intake of sodium 1500mg , cholesterol 200mg , calories 30% from fat and 7% or less from saturated fats, daily to have 5 or more servings of fruits and vegetables.  Biometrics:      Post Biometrics - 11/23/16 1207       Post  Biometrics   Height 6\' 1"  (1.854 m)   Weight 229 lb 0.9 oz  (103.9 kg)   Waist Circumference 40 inches   Hip Circumference 43 inches   Waist to Hip Ratio 0.93 %   BMI (Calculated) 30.3   Triceps Skinfold 16 mm   % Body Fat 28.1 %   Grip Strength 78 kg   Flexibility 13.83 in   Single Leg Stand 60 seconds      Nutrition Therapy Plan and Nutrition Goals:   Nutrition Discharge: Rate Your Plate Scores:   Nutrition Goals Re-Evaluation:   Nutrition Goals Discharge (Final Nutrition Goals Re-Evaluation):   Psychosocial: Target Goals: Acknowledge presence or absence of significant depression and/or stress, maximize coping skills, provide positive support system. Participant is able to verbalize types and ability to use techniques and skills needed for reducing stress and depression.  Initial Review & Psychosocial Screening:     Initial Psych Review & Screening - 11/23/16 1223      Initial Review   Current issues with None Identified     Family Dynamics   Good Support System? Yes     Barriers   Psychosocial barriers to participate in program There are no identifiable barriers or psychosocial needs.     Screening Interventions   Interventions Encouraged to exercise      Quality of Life Scores:     Quality of Life - 11/23/16 1208      Quality of Life Scores   Health/Function Pre 23.27 %   Socioeconomic Pre 24.19 %   Psych/Spiritual Pre 20.29 %   Family Pre 21 %   GLOBAL Pre 22.6 %      PHQ-9: Recent Review Flowsheet Data    Depression screen [facility] 2/9 11/23/2016 09/16/2016 09/10/2016 07/25/2016 07/04/2016   Decreased Interest 0 0 0 0 0   Down, Depressed, Hopeless 0 0 0 0 0   PHQ - 2 Score 0 0 0 0 0   Altered sleeping 0 - - - -   Tired, decreased energy 0 - - - -   Change in appetite 1 - - - -   Feeling bad or failure about yourself  0 - - - -   Trouble concentrating 0 - - - -   Moving slowly  or fidgety/restless 0 - - - -   PHQ-9 Score 1 - - - -   Difficult doing work/chores Not difficult at all - - - -      Interpretation of Total Score  Total Score Depression Severity:  1-4 = Minimal depression, 5-9 = Mild depression, 10-14 = Moderate depression, 15-19 = Moderately severe depression, 20-27 = Severe depression   Psychosocial Evaluation and Intervention:     Psychosocial Evaluation - 11/23/16 1223      Psychosocial Evaluation & Interventions   Interventions Encouraged to exercise with the program and follow exercise prescription   Continue Psychosocial Services  No Follow up required      Psychosocial Re-Evaluation:   Psychosocial Discharge (Final Psychosocial Re-Evaluation):   Vocational Rehabilitation: Provide vocational rehab assistance to qualifying candidates.   Vocational Rehab Evaluation & Intervention:     Vocational Rehab - 11/23/16 1216      Initial Vocational Rehab Evaluation & Intervention   Assessment shows need for Vocational Rehabilitation No      Education: Education Goals: Education classes will be provided on a weekly basis, covering required topics. Participant will state understanding/return demonstration of topics presented.  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 Discus how high blood pressure affects the body and ways to reduce high blood pressure.   Exercise and Your Heart -Discuss why it is important to exercise, the FITT principles of exercise, normal and abnormal responses to exercise, and how to exercise safely.   Angina -Discuss definition of angina, causes of angina, treatment of angina, and how to decrease risk of having angina.   Cardiac Medications -Review what the following cardiac medications are used for,  how they affect the body, and side effects that may occur when taking the medications.  Medications include Aspirin, Beta blockers, calcium channel blockers, ACE Inhibitors, angiotensin receptor blockers, diuretics, digoxin, and antihyperlipidemics.   Congestive Heart Failure -Discuss the definition of CHF, how to live with CHF, the signs and symptoms of CHF, and how keep track of weight and sodium intake.   Heart Disease and Intimacy -Discus the effect sexual activity has on the heart, how changes occur during intimacy as we age, and safety during sexual activity.   Smoking Cessation / COPD -Discuss different methods to quit smoking, the health benefits of quitting smoking, and the definition of COPD.   Nutrition I: Fats -Discuss the types of cholesterol, what cholesterol does to the heart, and how cholesterol levels can be controlled.   Nutrition II: Labels -Discuss the different components of food labels and how to read food label   Heart Parts and Heart Disease -Discuss the anatomy of the heart, the pathway of blood circulation through the heart, and these are affected by heart disease.   Stress I: Signs and Symptoms -Discuss the causes of stress, how stress may lead to anxiety and depression, and ways to limit stress.   Stress II: Relaxation -Discuss different types of relaxation techniques to limit stress.   CARDIAC REHAB PHASE II EXERCISE from 12/07/2016 in [place]  Date  12/07/16  Educator  DJ  Instruction Review Code  2- meets goals/outcomes      Warning Signs of Stroke / TIA -Discuss  definition of a stroke, what the signs and symptoms are of a stroke, and how to identify when someone is having stroke.   Knowledge Questionnaire Score:     Knowledge Questionnaire Score - 11/23/16 1216      Knowledge Questionnaire Score   Pre Score 23/24      Core Components/Risk Factors/Patient Goals at Admission:     Personal Goals and Risk  Factors at Admission - 11/23/16 1221      Core Components/Risk Factors/Patient Goals on Admission    Weight Management Weight Maintenance   Personal Goal Other Yes   Personal Goal Improvement of my health, get back to playing golf   Intervention Attend CR 3 x week and supplement exercise at home 2 x week on his recumbent bike.    Expected Outcomes Achieve his personal goals.       Core Components/Risk Factors/Patient Goals Review:      Goals and Risk Factor Review    Row Name 11/23/16 1223             Core Components/Risk Factors/Patient Goals Review   Personal Goals Review Weight Management/Obesity          Core Components/Risk Factors/Patient Goals at Discharge (Final Review):      Goals and Risk Factor Review - 11/23/16 1223      Core Components/Risk Factors/Patient Goals Review   Personal Goals Review Weight Management/Obesity      ITP Comments:     ITP Comments    Row Name 11/23/16 1232 11/23/16 1247 12/12/16 1508       ITP Comments Mr. Mcgann is a 66 year old male who also had a recent CVA that has affected his (R) leg causing it to be numb. He is able to walk without any assistive devices. He will not be alble to use the treadmill. All other functions have returned. Cognitively his is a little slow but able to function. 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  Patient new to program. Plans to start Monday 11/28/16. Patient has been recently diagnosed with terminal stomach cancer. Spoke with his wife today. She says she wants to postpone his rehab for 2 weeks until they decide on a plan and see if patient will be able to continue the program.         Comments: ITP 30 Day REVIEW Patient has been recently diagnosed with terminal stomach cancer. Spoke with his wife today. She says she wants to postpone his rehab for 2 weeks until they decide on a plan and see if patient will be able to continue the program.

## 2016-12-12 NOTE — Telephone Encounter (Signed)
Called wife let her know that a stronger pain medication Rx was available to be picked up at our office. She will not be able to get here today, states that for now the tramadol is working okay, he has had two tablets today.

## 2016-12-13 ENCOUNTER — Other Ambulatory Visit: Payer: Self-pay | Admitting: Hematology

## 2016-12-13 ENCOUNTER — Telehealth: Payer: Self-pay | Admitting: *Deleted

## 2016-12-13 DIAGNOSIS — C169 Malignant neoplasm of stomach, unspecified: Secondary | ICD-10-CM | POA: Insufficient documentation

## 2016-12-13 NOTE — Telephone Encounter (Signed)
Per Dr. [name], spoke to [facility] in pathology to have MSI and HER2 testing added to recent pathology.  Kim verbalized understanding.

## 2016-12-14 ENCOUNTER — Telehealth: Payer: Self-pay | Admitting: Hematology

## 2016-12-14 ENCOUNTER — Encounter (HOSPITAL_COMMUNITY): Payer: Medicare Other

## 2016-12-14 NOTE — Progress Notes (Signed)
Cardiology Office Note    Date:  12/15/2016   ID:  [name], DOB 06-Sep-1950, MRN [id number]  PCP:  Worthy Rancher, MD  Cardiologist: Sinclair Grooms, MD   Chief Complaint  Patient presents with  . Congestive Heart Failure  . Coronary Artery Disease    History of Present Illness:  [name] is a 66 y.o. male embolic CVA within the past 2 months, pulmonary embolus within the past month, and recent ST elevation anterior wall myocardial infarction treated with emergency PCI and stenting of the LAD. Most recently underwent UGI endoscopy and confirmed large gastric mass, poorly differentiated adenocarcinoma, by biopsy.  Overall doing well from cardiac standpoint. No bleeding complications from UGI endoscopy and biopsy by Dr. Loletha Carrow last week. Adenocarcinoma has been proven by biopsy. An appointment with Dr. [name] is coming up.  He denies orthopnea, PND, but does have right lower extremity swelling since his pulmonary emboli.  Past Medical History:  Diagnosis Date  . Anginal pain ([place])   . CAD in native artery    a. STEMI 09/2016 s/p DES to mLAD, residual 65-75% OM1 disease treated medically. EF 30-35% by cath, 35-40% by echo at dc.  . Chronic systolic CHF (congestive heart failure) ([place])    a. Dx 09/2016 - EF 30-35% by cath, 35-40% by echo.  . Depression   . Dilatation of aorta ([place])    a. CT 09/2016: dilation of the ascending thoracic aorta measuring 4.4 cm in maximum diameter, recommend f/u 2021 if appropriate.  [name] Kitchen DVT (deep venous thrombosis) ([place])    a. Bilat DVT noted 08/2016.  . Essential hypertension   . GERD (gastroesophageal reflux disease)   . 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 History of stroke    a. 09/2016.  [name] Kitchen Hypercoagulable state ([place])   . Hyperlipidemia   . Hyponatremia   . Ischemic cardiomyopathy   . Lymphadenopathy    a. Abd lymphadenopathy concerning for malignancy noted in 09/2016.  [name] Kitchen Normocytic anemia   . PE (pulmonary thromboembolism)  ([place])    a. Bilat PE 08/2016.  . Right leg weakness    secondary to stroke  . Sleep apnea   . Stroke ([place])   . Suspected sleep apnea     Past Surgical History:  Procedure Laterality Date  . adnoidectomy    . APPENDECTOMY    . CORONARY STENT INTERVENTION N/A 10/11/2016   Procedure: Coronary Stent Intervention;  Surgeon: Belva Crome, MD;  Location: [facility];  Service: Cardiovascular;  Laterality: N/A;  . ESOPHAGOGASTRODUODENOSCOPY N/A 12/09/2016   Procedure: ESOPHAGOGASTRODUODENOSCOPY (EGD);  Surgeon: Doran Stabler, MD;  Location: [facility];  Service: Gastroenterology;  Laterality: N/A;  . LEFT HEART CATH AND CORONARY ANGIOGRAPHY N/A 10/11/2016   Procedure: Left Heart Cath and Coronary Angiography;  Surgeon: Belva Crome, MD;  Location: [facility];  Service: Cardiovascular;  Laterality: N/A;  . PROSTATE SURGERY    . TONSILLECTOMY AND ADENOIDECTOMY      Current Medications: Outpatient Medications Prior to Visit  Medication Sig Dispense Refill  . atorvastatin (LIPITOR) 80 MG tablet Take 1 tablet (80 mg total) by mouth every evening. 90 tablet 0  . enoxaparin (LOVENOX) 100 MG/ML injection Inject 1 mL (100 mg total) into the skin every 12 (twelve) hours. 60 Syringe 1  . escitalopram (LEXAPRO) 20 MG tablet Take 1 tablet (20 mg total) by mouth daily. 90 tablet 2  . multivitamin-lutein (  OCUVITE-LUTEIN) CAPS capsule Take 1 capsule by mouth daily.    . nitroGLYCERIN (NITROSTAT) 0.4 MG SL tablet Place 1 tablet (0.4 mg total) under the tongue every 5 (five) minutes as needed for chest pain (up to 3 doses). 25 tablet 3  . pantoprazole (PROTONIX) 40 MG tablet Take 1 tablet (40 mg total) by mouth daily. 30 tablet 1  . carvedilol (COREG) 6.25 MG tablet Take 1 tablet (6.25 mg total) by mouth 2 (two) times daily. 180 tablet 3  . clopidogrel (PLAVIX) 75 MG tablet Take 1 tablet (75 mg total) by mouth daily. 30 tablet 1  . HYDROcodone-acetaminophen (NORCO/VICODIN) 5-325 MG tablet  Take 1-2 tablets by mouth every 6 (six) hours as needed for moderate pain. (Patient not taking: Reported on 12/15/2016) 50 tablet 0  . traMADol (ULTRAM) 50 MG tablet Take 50 mg by mouth 4 (four) times daily as needed for moderate pain.     No facility-administered medications prior to visit.      Allergies:   Patient has no known allergies.   Social History   Social History  . Marital status: Married    Spouse name: N/A  . Number of children: N/A  . Years of education: N/A   Social History Main Topics  . Smoking status: Never Smoker  . Smokeless tobacco: Never Used  . Alcohol use No  . Drug use: No  . Sexual activity: Yes    Birth control/ protection: Post-menopausal     Comment: Married for 16 years   Other Topics Concern  . None   Social History Narrative   Epworth Sleepiness Scale = 12 (as of 09/09/2015)     Family History:  The patient's  family history includes AAA (abdominal aortic aneurysm) in his mother; COPD in his mother; Depression in his father; Diabetes in his brother and father; Early death in his brother; Heart disease in his brother, brother, father, maternal grandfather, maternal grandmother, and mother; Stroke in his paternal grandmother.   ROS:   Please see the history of present illness.    Cough, abdominal discomfort, and constipation.  All other systems reviewed and are negative.   PHYSICAL EXAM:   VS:  BP 102/70 (BP Location: Right Arm)   Pulse 81   Ht 6\' 1"  (1.854 m)   Wt 231 lb 12.8 oz (105.1 kg)   BMI 30.58 kg/m    GEN: Well nourished, well developed, in no acute distress  HEENT: normal  Neck: no JVD, carotid bruits, or masses Cardiac: RRR; no murmurs, rubs, or gallops,no edema  Respiratory:  clear to auscultation bilaterally, normal work of breathing GI: soft, nontender, nondistended, + BS MS: no deformity or atrophy  Skin: warm and dry, no rash Neuro:  Alert and Oriented x 3, Strength and sensation are intact Psych: euthymic mood, full  affect  Wt Readings from Last 3 Encounters:  12/15/16 231 lb 12.8 oz (105.1 kg)  12/06/16 233 lb (105.7 kg)  11/23/16 229 lb 0.9 oz (103.9 kg)      Studies/Labs Reviewed:   EKG:  EKG  No new data  Recent Labs: 11/16/2016: ALT 30; BUN 12.6; Creatinine 0.8; HGB 12.0; Platelets 185; Potassium 3.9; Sodium 138   Lipid Panel    Component Value Date/Time   CHOL 131 10/11/2016 0339   CHOL 134 07/25/2016 0850   TRIG 131 10/11/2016 0339   HDL 37 (L) 10/11/2016 0339   HDL 41 07/25/2016 0850   CHOLHDL 3.5 10/11/2016 0339   VLDL 26 10/11/2016 0339  [place] 68 10/11/2016 0339   LDLCALC 73 07/25/2016 0850    Additional studies/ records that were reviewed today include:  Recent biopsy.    ASSESSMENT:    1. CAD S/P percutaneous coronary angioplasty   2. Essential hypertension   3. Other acute pulmonary embolism without acute cor pulmonale (HCC)   4. 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 Gastric adenocarcinoma ([place])      PLAN:  In order of problems listed above:  1. Doing well without recurrent angina. LAD stent seems to be holding up on single oral antiplatelet therapy. Continue Plavix 75 mg per day. 2. After stopping diuretic therapy and ARB, blood pressure is better. No intention to resume. 3. Not addressed 4. Not addressed 5. They are to see Dr. [name] later this week  Continue current therapy. Clinical follow-up 4-6 months. I encouraged the patient and his wife.  Medication Adjustments/Labs and Tests Ordered: Current medicines are reviewed at length with the patient today.  Concerns regarding medicines are outlined above.  Medication changes, Labs and Tests ordered today are listed in the Patient Instructions below. Patient Instructions  Medication Instructions:  None  Labwork: None  Testing/Procedures: None  Follow-Up: Your physician wants you to follow-up in: 6 months with Dr. Tamala Julian.  You will receive a reminder  letter in the mail two months in advance. If you don't receive a letter, please call our office to schedule the follow-up appointment.   Any Other Special Instructions Will Be Listed Below (If Applicable).     If you need a refill on your cardiac medications before your next appointment, please call your pharmacy.      Signed, Sinclair Grooms, MD  12/15/2016 10:27 AM    [address], [place], [place]  [address] Phone: [phone]; Fax: [phone]

## 2016-12-14 NOTE — Telephone Encounter (Signed)
Scheduled appt per schedule message from Myrtie Neither - patients wife is aware of appt time and date.

## 2016-12-15 ENCOUNTER — Other Ambulatory Visit (HOSPITAL_BASED_OUTPATIENT_CLINIC_OR_DEPARTMENT_OTHER): Payer: Medicare Other

## 2016-12-15 ENCOUNTER — Encounter: Payer: Self-pay | Admitting: Interventional Cardiology

## 2016-12-15 ENCOUNTER — Ambulatory Visit (HOSPITAL_BASED_OUTPATIENT_CLINIC_OR_DEPARTMENT_OTHER): Payer: Medicare Other | Admitting: Hematology

## 2016-12-15 ENCOUNTER — Ambulatory Visit (INDEPENDENT_AMBULATORY_CARE_PROVIDER_SITE_OTHER): Payer: Medicare Other | Admitting: Interventional Cardiology

## 2016-12-15 ENCOUNTER — Encounter: Payer: Self-pay | Admitting: Hematology

## 2016-12-15 VITALS — BP 102/70 | HR 81 | Ht 73.0 in | Wt 231.8 lb

## 2016-12-15 VITALS — BP 109/75 | HR 79 | Temp 97.8°F | Resp 18 | Ht 73.0 in | Wt 230.6 lb

## 2016-12-15 DIAGNOSIS — C169 Malignant neoplasm of stomach, unspecified: Secondary | ICD-10-CM

## 2016-12-15 DIAGNOSIS — I63133 Cerebral infarction due to embolism of bilateral carotid arteries: Secondary | ICD-10-CM | POA: Diagnosis not present

## 2016-12-15 DIAGNOSIS — D6869 Other thrombophilia: Secondary | ICD-10-CM

## 2016-12-15 DIAGNOSIS — I10 Essential (primary) hypertension: Secondary | ICD-10-CM | POA: Diagnosis not present

## 2016-12-15 DIAGNOSIS — Z7901 Long term (current) use of anticoagulants: Secondary | ICD-10-CM | POA: Diagnosis not present

## 2016-12-15 DIAGNOSIS — I82409 Acute embolism and thrombosis of unspecified deep veins of unspecified lower extremity: Secondary | ICD-10-CM

## 2016-12-15 DIAGNOSIS — I2510 Atherosclerotic heart disease of native coronary artery without angina pectoris: Secondary | ICD-10-CM

## 2016-12-15 DIAGNOSIS — Z7189 Other specified counseling: Secondary | ICD-10-CM

## 2016-12-15 DIAGNOSIS — C787 Secondary malignant neoplasm of liver and intrahepatic bile duct: Secondary | ICD-10-CM

## 2016-12-15 DIAGNOSIS — I2699 Other pulmonary embolism without acute cor pulmonale: Secondary | ICD-10-CM | POA: Diagnosis not present

## 2016-12-15 DIAGNOSIS — Z9861 Coronary angioplasty status: Secondary | ICD-10-CM

## 2016-12-15 DIAGNOSIS — Z86711 Personal history of pulmonary embolism: Secondary | ICD-10-CM | POA: Diagnosis not present

## 2016-12-15 DIAGNOSIS — I255 Ischemic cardiomyopathy: Secondary | ICD-10-CM | POA: Diagnosis not present

## 2016-12-15 DIAGNOSIS — Z86718 Personal history of other venous thrombosis and embolism: Secondary | ICD-10-CM | POA: Diagnosis not present

## 2016-12-15 LAB — COMPREHENSIVE METABOLIC PANEL
ALT: 20 U/L (ref 0–55)
ANION GAP: 8 meq/L (ref 3–11)
AST: 21 U/L (ref 5–34)
Albumin: 3.3 g/dL — ABNORMAL LOW (ref 3.5–5.0)
Alkaline Phosphatase: 118 U/L (ref 40–150)
BUN: 11.9 mg/dL (ref 7.0–26.0)
CALCIUM: 9.7 mg/dL (ref 8.4–10.4)
CHLORIDE: 101 meq/L (ref 98–109)
CO2: 26 meq/L (ref 22–29)
Creatinine: 0.8 mg/dL (ref 0.7–1.3)
Glucose: 116 mg/dl (ref 70–140)
Potassium: 4.3 mEq/L (ref 3.5–5.1)
Sodium: 135 mEq/L — ABNORMAL LOW (ref 136–145)
TOTAL PROTEIN: 6.3 g/dL — AB (ref 6.4–8.3)
Total Bilirubin: 0.63 mg/dL (ref 0.20–1.20)

## 2016-12-15 LAB — IRON AND TIBC
%SAT: 9 % — ABNORMAL LOW (ref 20–55)
IRON: 24 ug/dL — AB (ref 42–163)
TIBC: 266 ug/dL (ref 202–409)
UIBC: 243 ug/dL (ref 117–376)

## 2016-12-15 LAB — CBC & DIFF AND RETIC
BASO%: 0.2 % (ref 0.0–2.0)
Basophils Absolute: 0 10*3/uL (ref 0.0–0.1)
EOS ABS: 0.1 10*3/uL (ref 0.0–0.5)
EOS%: 2.3 % (ref 0.0–7.0)
HCT: 35.4 % — ABNORMAL LOW (ref 38.4–49.9)
HEMOGLOBIN: 11.5 g/dL — AB (ref 13.0–17.1)
Immature Retic Fract: 8.5 % (ref 3.00–10.60)
LYMPH%: 22.4 % (ref 14.0–49.0)
MCH: 27.5 pg (ref 27.2–33.4)
MCHC: 32.5 g/dL (ref 32.0–36.0)
MCV: 84.7 fL (ref 79.3–98.0)
MONO#: 0.6 10*3/uL (ref 0.1–0.9)
MONO%: 14.5 % — AB (ref 0.0–14.0)
NEUT%: 60.6 % (ref 39.0–75.0)
NEUTROS ABS: 2.6 10*3/uL (ref 1.5–6.5)
Platelets: 185 10*3/uL (ref 140–400)
RBC: 4.18 10*6/uL — AB (ref 4.20–5.82)
RDW: 14 % (ref 11.0–14.6)
RETIC %: 1.66 % (ref 0.80–1.80)
Retic Ct Abs: 69.39 10*3/uL (ref 34.80–93.90)
WBC: 4.3 10*3/uL (ref 4.0–10.3)
lymph#: 1 10*3/uL (ref 0.9–3.3)

## 2016-12-15 LAB — FERRITIN: FERRITIN: 142 ng/mL (ref 22–316)

## 2016-12-15 MED ORDER — PANTOPRAZOLE SODIUM 40 MG PO TBEC: 40.0000 mg | DELAYED_RELEASE_TABLET | Freq: Every day | ORAL | 3 refills | Status: DC

## 2016-12-15 MED ORDER — CARVEDILOL 6.25 MG PO TABS: 6.2500 mg | ORAL_TABLET | Freq: Two times a day (BID) | ORAL | 3 refills | Status: DC

## 2016-12-15 MED ORDER — CAPECITABINE 500 MG PO TABS: 875.0000 mg/m2 | ORAL_TABLET | Freq: Two times a day (BID) | ORAL | 1 refills | Status: DC

## 2016-12-15 MED ORDER — SENNOSIDES-DOCUSATE SODIUM 8.6-50 MG PO TABS: 2.0000 | ORAL_TABLET | Freq: Every day | ORAL | 1 refills | Status: DC

## 2016-12-15 MED ORDER — ONDANSETRON HCL 4 MG PO TABS: 4.0000 mg | ORAL_TABLET | Freq: Three times a day (TID) | ORAL | 2 refills | Status: DC | PRN

## 2016-12-15 MED ORDER — MORPHINE SULFATE 15 MG PO TABS: 15.0000 mg | ORAL_TABLET | ORAL | 0 refills | Status: DC | PRN

## 2016-12-15 MED ORDER — CLOPIDOGREL BISULFATE 75 MG PO TABS: 75.0000 mg | ORAL_TABLET | Freq: Every day | ORAL | 3 refills | Status: DC

## 2016-12-15 NOTE — Patient Instructions (Signed)
Thank you for choosing [facility] to provide your oncology and hematology care.  To afford each patient quality time with our providers, please arrive 30 minutes before your scheduled appointment time.  If you arrive late for your appointment, you may be asked to reschedule.  We strive to give you quality time with our providers, and arriving late affects you and other patients whose appointments are after yours.  If you are a no show for multiple scheduled visits, you may be dismissed from the clinic at the providers discretion.   Again, thank you for choosing [facility], our hope is that these requests will decrease the amount of time that you wait before being seen by our physicians.  ______________________________________________________________________ Should you have questions after your visit to the [facility], please contact our office at [phone] between the hours of 8:30 and 4:30 p.m.    Voicemails left after 4:30p.m will not be returned until the following business day.   For prescription refill requests, please have your pharmacy contact us directly.  Please also try to allow 48 hours for prescription requests.   Please contact the scheduling department for questions regarding scheduling.  For scheduling of procedures such as PET scans, CT scans, MRI, Ultrasound, etc please contact central scheduling at [phone].   Resources For Cancer Patients and Caregivers:  American Cancer Society:  800-227-2345  Can help patients locate various types of support and financial assistance Cancer Care: 1-800-813-HOPE (4673) Provides financial assistance, online support groups, medication/co-pay assistance.   [place] DSS:  336-641-3447 Where to apply for food stamps, Medicaid, and utility assistance Medicare Rights Center: 800-333-4114 Helps people with Medicare understand their rights and benefits, navigate the Medicare system, and secure the  quality healthcare they deserve SCAT: 336-333-6589 [place] [employer]'s shared-ride transportation service for eligible riders who have a disability that prevents them from riding the fixed route bus.   For additional information on assistance programs please contact our social worker:   Grier Hock/Abigail Elmore:  [phone] 

## 2016-12-15 NOTE — Progress Notes (Signed)
START OFF PATHWAY REGIMEN - Gastroesophageal   [id number]:Capecitabine + Oxaliplatin (1000/130):   A cycle is every 21 days:     Capecitabine      Oxaliplatin   **Always confirm dose/schedule in your pharmacy ordering system**    Patient Characteristics: Distant Metastases (cM1/pM1) / Locally Recurrent Disease, Adenocarcinoma - Esophageal, [place], and Gastric, First Line, HER2 Negative / Unknown Histology: Adenocarcinoma Disease Classification: Gastric Therapeutic Status: Distant Metastases (No Additional Staging) Would you be surprised if this patient died  in the next year? I would NOT be surprised if this patient died in the next year Line of therapy: First Line HER2 Status: Awaiting Test Results  Intent of Therapy: Non-Curative / Palliative Intent, Discussed with Patient

## 2016-12-15 NOTE — Patient Instructions (Signed)
Medication Instructions:  None  Labwork: None  Testing/Procedures: None  Follow-Up: Your physician wants you to follow-up in: 6 months with Dr. Smith.  You will receive a reminder letter in the mail two months in advance. If you don't receive a letter, please call our office to schedule the follow-up appointment.   Any Other Special Instructions Will Be Listed Below (If Applicable).     If you need a refill on your cardiac medications before your next appointment, please call your pharmacy.   

## 2016-12-16 ENCOUNTER — Encounter (HOSPITAL_COMMUNITY)
Admission: RE | Admit: 2016-12-16 | Discharge: 2016-12-16 | Disposition: A | Discharge status: Home / Self Care | Payer: Medicare Other | Source: Ambulatory Visit | Attending: Interventional Cardiology | Admitting: Interventional Cardiology

## 2016-12-16 ENCOUNTER — Telehealth: Payer: Self-pay | Admitting: Hematology

## 2016-12-16 DIAGNOSIS — I214 Non-ST elevation (NSTEMI) myocardial infarction: Secondary | ICD-10-CM

## 2016-12-16 DIAGNOSIS — Z955 Presence of coronary angioplasty implant and graft: Secondary | ICD-10-CM | POA: Diagnosis not present

## 2016-12-16 DIAGNOSIS — I2102 ST elevation (STEMI) myocardial infarction involving left anterior descending coronary artery: Secondary | ICD-10-CM | POA: Diagnosis not present

## 2016-12-16 NOTE — Telephone Encounter (Signed)
Scheduled appt per 4/26 los. - patient is aware and is my chart active and will check mychart for appt dates and times

## 2016-12-16 NOTE — Progress Notes (Signed)
Daily Session Note  Patient Details  Name: [name] MRN: [id number] Date of Birth: 04/09/1951 Referring Provider:     CARDIAC REHAB PHASE II ORIENTATION from 11/23/2016 in [facility]  Referring Provider  Dr. Smith      Encounter Date: 12/16/2016  Check In:     Session Check In - 12/16/16 0953      Check-In   Location [facility]   Staff Present Diane Coad, MS, EP, CHC, Exercise Physiologist;Rakel Junio, BS, EP, Exercise Physiologist   Supervising physician immediately available to respond to emergencies See telemetry face sheet for immediately available MD   Medication changes reported     No   Fall or balance concerns reported    No   Warm-up and Cool-down Performed as group-led instruction   Resistance Training Performed Yes   VAD Patient? No     VAD patient   Has back up controller? No     Pain Assessment   Currently in Pain? No/denies   Pain Score 0-No pain   Multiple Pain Sites No      Capillary Blood Glucose: Results for orders placed or performed in visit on 12/15/16 (from the past 24 hour(s))  CBC & Diff and Retic     Status: Abnormal   Collection Time: 12/15/16 11:55 AM  Result Value Ref Range   WBC 4.3 4.0 - 10.3 10e3/uL   NEUT# 2.6 1.5 - 6.5 10e3/uL   HGB 11.5 (L) 13.0 - 17.1 g/dL   HCT 35.4 (L) 38.4 - 49.9 %   Platelets 185 140 - 400 10e3/uL   MCV 84.7 79.3 - 98.0 fL   MCH 27.5 27.2 - 33.4 pg   MCHC 32.5 32.0 - 36.0 g/dL   RBC 4.18 (L) 4.20 - 5.82 10e6/uL   RDW 14.0 11.0 - 14.6 %   lymph# 1.0 0.9 - 3.3 10e3/uL   MONO# 0.6 0.1 - 0.9 10e3/uL   Eosinophils Absolute 0.1 0.0 - 0.5 10e3/uL   Basophils Absolute 0.0 0.0 - 0.1 10e3/uL   NEUT% 60.6 39.0 - 75.0 %   LYMPH% 22.4 14.0 - 49.0 %   MONO% 14.5 (H) 0.0 - 14.0 %   EOS% 2.3 0.0 - 7.0 %   BASO% 0.2 0.0 - 2.0 %   Retic % 1.66 0.80 - 1.80 %   Retic Ct Abs 69.39 34.80 - 93.90 10e3/uL   Immature Retic Fract 8.50 3.00 - 10.60 %   Narrative   Performed At:   [facility]               [address]               [place], [place] [address]  Ferritin     Status: None   Collection Time: 12/15/16 11:55 AM  Result Value Ref Range   Ferritin 142 22 - 316 ng/ml   Narrative   Performed At:  [facility]               [address]               [place], [place] [address]  Iron and TIBC     Status: Abnormal   Collection Time: 12/15/16 11:55 AM  Result Value Ref Range   Iron 24 (L) 42 - 163 ug/dL   TIBC 266 202 - 409 ug/dL   UIBC 243 117 - 376 ug/dL   %SAT 9 (L) 20 - 55 %     Narrative   Performed At:  [facility]               [address]               [place], [place] [address]  Comprehensive metabolic panel     Status: Abnormal   Collection Time: 12/15/16 11:55 AM  Result Value Ref Range   Sodium 135 (L) 136 - 145 mEq/L   Potassium 4.3 3.5 - 5.1 mEq/L   Chloride 101 98 - 109 mEq/L   CO2 26 22 - 29 mEq/L   Glucose 116 70 - 140 mg/dl   BUN 11.9 7.0 - 26.0 mg/dL   Creatinine 0.8 0.7 - 1.3 mg/dL   Total Bilirubin 0.63 0.20 - 1.20 mg/dL   Alkaline Phosphatase 118 40 - 150 U/L   AST 21 5 - 34 U/L   ALT 20 0 - 55 U/L   Total Protein 6.3 (L) 6.4 - 8.3 g/dL   Albumin 3.3 (L) 3.5 - 5.0 g/dL   Calcium 9.7 8.4 - 10.4 mg/dL   Anion Gap 8 3 - 11 mEq/L   EGFR >90 >90 ml/min/1.73 m2   Narrative   Performed At:  [facility]               [address]               [place], [place] [address]      History  Smoking Status  . Never Smoker  Smokeless Tobacco  . Never Used    Goals Met:  Independence with exercise equipment Exercise tolerated well No report of cardiac concerns or symptoms Strength training completed today  Goals Unmet:  Not Applicable  Comments: Check out 1030   Dr. Suresh Koneswaran is Medical Director for [facility]. 

## 2016-12-18 NOTE — Progress Notes (Signed)
[name] Kitchen    HEMATOLOGY/ONCOLOGY CLINIC NOTE  Date of Service: .12/15/2016  Patient Care Team: Worthy Rancher, MD as PCP - General (Family Medicine) Dr Daneen Schick MD (Cardiology)  CHIEF COMPLAINTS/PURPOSE OF CONSULTATION:  Extensive b/l lower extremity DVT and b/l large volume pulmonary embolism Extensive embolic CVA's Abdominal LNadenopathy and stomach lesions concerning for malignancy s/p Acute STEMI s/p stenting. (10/11/2016)   HISTORY OF PRESENTING ILLNESS:   [name] is a wonderful 66 y.o. male with a very complicated medical presentation. He had apparently presented with b/l extensive DVT and pulmonary embolism on 09/10/2016 and was started on Xarelto with loading dose followed standard once daily therapeutic dosing.  He notes his breathing was improving and he was not on oxygen at home . He subsequently presented to the ED with confusion and AMS for 1-2 days and had imaging which showed Multiple embolic infarctions scattered throughout the cerebellum and both cerebral hemispheres consistent with embolic disease from the heart or ascending aorta. Most extensive in the region of involvement is within the left MCA territory where there appears to be a missing left M2 branch. No large confluent infarction however. No hemorrhage, swelling or mass effect.  Patient had a CT angiogram of Chest and Abd which upper abdominal lesions and then he had a dedicated CT abd which showed Abdominal lymphadenopathy in retroperitoneum, porta hepatis,gastrohepatic and gastrosplenic ligaments, and inferior mediastinum. Differential diagnosis includes metastatic disease and lymphoma. Mildly enlarged prostate, with focal contrast enhancement in the right anterior mid gland which may be due to prostatitis or prostate carcinoma. No pelvic lymphadenopathy identified. PSA levels WNL  His Xarelto was changed to Eliquis 25m po BID. Patient and his wife note that his CVA symptoms were improving and that his  verbal recall was getting better. He still notes issues with balance (probably from cerebellar involvement).  He was on Eliquis and the abdominal LNadenopathy was not easy to biopsy so he had a PET/CT to further evaluate the disease. PET/CT was done on 10/10/2016 and showed Two FDG avid foci within the wall of the stomach along the posterior aspect of the greater curvature. These are fairly FDG avid SUV 12.9. No overt discrete lesions on CT. Hypermetabolic gastrohepatic ligament lymph nodes, gastrosplenic lymph nodes, porta hepatis lymph nodes and distal paraesophageal lymph nodes consistent with metastatic lymph nodes.  Patient unfortunate presented 08/10/2017 with a STEMI with troponin >65 and extensive antero-apical wall motion abnormality and some heart failure symptomatology. He has since had DES stenting of his mid LAD with a Synergy stent.  Patient did well after his cardiac intervention and was discharged home. He notes that over the last month or so he has done well at home with progressive ambulation and minimal symptomatology remaining from his CVA. No chest pain or shortness of breath no angina has not needed any nitroglycerin. Has some indigestion and dyspeptic symptoms but no overt abdominal pain or distention. 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  He notes that he will be starting cardiac rehabilitation from 11/23/2016 2-3 times a week. He reports that his Coreg was recently increased to optimize his treatment for CAD.  He continues to be on Lovenox twice a day as anticoagulation as per our recommendations. No issues with overt bleeding.  His hypercoagulability workup was unrevealing and negative for antiphospholipid antibodies, negative for factor V Leiden Mutation and prothrombin gene mutation.  Cardiology previously noted that they would recommend drug antiplatelet therapy  for at least 3 months prior to any biopsies. He is  now off aspirin from 11/15/2016 as per cardiology recommendations.  He also had a sleep study on 10/30/2016 that showed severe obstructive sleep apnea with an optimal CPAP setting of 12cms of H20 recommended by Dr Trey Sailors.  INTERVAL HISTORY  [name] is here for follow-up along with his wife to discuss the results of his recent endoscopic gastric mass biopsy. After his last clinic visit he had a CT of the abdomen on 11/30/2016 which showed obvious gastric mass and interval progression of abdominal and retroperitoneal lymphadenopathy and multiple hepatic metastases. I had called and discussed the results of his CT scan with his wife.  I discussed with Dr. Erick Alley and Dr. Smith/cardiology about anticoagulation recommendations. After coordinating care with them the patient was able to get an EGD with biopsy of his gastric mass on 12/09/2016. Biopsy shows poorly differentiated gastric adenocarcinoma. HER-2/neu testing and MSI status testing are currently pending.  I had a extensive frank and honest discussion with the patient and his wife about the fact that his metastatic poorly differentiated gastric carcinoma is not curative and treatments are only palliative in nature. We discussed palliative chemotherapy versus best supportive care. They were keen to explore palliative chemotherapy. We had an extensive discussion about different palliative chemotherapy options and the patient reports that he will like to avoid a port which would be wise given his recent pulmonary embolism and other thrombotic complications and need for ongoing anticoagulation. We discussed using Capecitabine + Oxaloplatin. We discussed this regimen and patient provided informed consent.  He reports no overt GI bleeding at this time. He has been working with cardiac rehabilitation and currently has an ECOG performance status of 2. He has been having some abdominal discomfort likely related to his tumor and was prescribed morphine IR when  necessary for pain control.  MEDICAL HISTORY:  Past Medical History:  Diagnosis Date  . Anginal pain ([place])   . CAD in native artery    a. STEMI 09/2016 s/p DES to mLAD, residual 65-75% OM1 disease treated medically. EF 30-35% by cath, 35-40% by echo at dc.  . Chronic systolic CHF (congestive heart failure) ([place])    a. Dx 09/2016 - EF 30-35% by cath, 35-40% by echo.  . Depression   . Dilatation of aorta ([place])    a. CT 09/2016: dilation of the ascending thoracic aorta measuring 4.4 cm in maximum diameter, recommend f/u 2021 if appropriate.  [name] Kitchen DVT (deep venous thrombosis) ([place])    a. Bilat DVT noted 08/2016.  . Essential hypertension   . GERD (gastroesophageal reflux disease)   . 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 History of stroke    a. 09/2016.  [name] Kitchen Hypercoagulable state ([place])   . Hyperlipidemia   . Hyponatremia   . Ischemic cardiomyopathy   . Lymphadenopathy    a. Abd lymphadenopathy concerning for malignancy noted in 09/2016.  [name] Kitchen Normocytic anemia   . PE (pulmonary thromboembolism) ([place])    a. Bilat PE 08/2016.  . Right leg weakness    secondary to stroke  . Sleep apnea   . Stroke ([place])   . Suspected sleep apnea     SURGICAL HISTORY: Past Surgical History:  Procedure Laterality Date  . adnoidectomy    . APPENDECTOMY    . CORONARY STENT INTERVENTION N/A 10/11/2016   Procedure: Coronary Stent Intervention;  Surgeon: Belva Crome, MD;  Location: [place] [facility];  Service:  Cardiovascular;  Laterality: N/A;  . ESOPHAGOGASTRODUODENOSCOPY N/A 12/09/2016   Procedure: ESOPHAGOGASTRODUODENOSCOPY (EGD);  Surgeon: Doran Stabler, MD;  Location: [facility];  Service: Gastroenterology;  Laterality: N/A;  . LEFT HEART CATH AND CORONARY ANGIOGRAPHY N/A 10/11/2016   Procedure: Left Heart Cath and Coronary Angiography;  Surgeon: Belva Crome, MD;  Location: [facility];  Service: Cardiovascular;  Laterality: N/A;  . PROSTATE SURGERY    . TONSILLECTOMY AND ADENOIDECTOMY       SOCIAL HISTORY: Social History   Social History  . Marital status: Married    Spouse name: N/A  . Number of children: N/A  . Years of education: N/A   Occupational History  . Not on file.   Social History Main Topics  . Smoking status: Never Smoker  . Smokeless tobacco: Never Used  . Alcohol use No  . Drug use: No  . Sexual activity: Yes    Birth control/ protection: Post-menopausal     Comment: Married for 16 years   Other Topics Concern  . Not on file   Social History Narrative   Epworth Sleepiness Scale = 12 (as of 09/09/2015)    FAMILY HISTORY: Family History  Problem Relation Age of Onset  . COPD Mother   . Heart disease Mother   . AAA (abdominal aortic aneurysm) Mother   . Heart disease Father   . Depression Father   . Diabetes Father   . Heart disease Brother   . Early death Brother     heart attack  . Heart disease Maternal Grandmother   . Heart disease Maternal Grandfather   . Stroke Paternal Grandmother   . Heart disease Brother   . Diabetes Brother     ALLERGIES:  has No Known Allergies.  MEDICATIONS:  Current Outpatient Prescriptions  Medication Sig Dispense Refill  . atorvastatin (LIPITOR) 80 MG tablet Take 1 tablet (80 mg total) by mouth every evening. 90 tablet 0  . carvedilol (COREG) 6.25 MG tablet Take 1 tablet (6.25 mg total) by mouth 2 (two) times daily. 180 tablet 3  . clopidogrel (PLAVIX) 75 MG tablet Take 1 tablet (75 mg total) by mouth daily. 90 tablet 3  . enoxaparin (LOVENOX) 100 MG/ML injection Inject 1 mL (100 mg total) into the skin every 12 (twelve) hours. 60 Syringe 1  . escitalopram (LEXAPRO) 20 MG tablet Take 1 tablet (20 mg total) by mouth daily. 90 tablet 2  . multivitamin-lutein (OCUVITE-LUTEIN) CAPS capsule Take 1 capsule by mouth daily.    . nitroGLYCERIN (NITROSTAT) 0.4 MG SL tablet Place 1 tablet (0.4 mg total) under the tongue every 5 (five) minutes as needed for chest pain (up to 3 doses). 25 tablet 3  .  pantoprazole (PROTONIX) 40 MG tablet Take 1 tablet (40 mg total) by mouth daily. 90 tablet 3  . capecitabine (XELODA) 500 MG tablet Take 4 tablets (2,000 mg total) by mouth 2 (two) times daily after a meal. 112 tablet 1  . morphine (MSIR) 15 MG tablet Take 1 tablet (15 mg total) by mouth every 4 (four) hours as needed for moderate pain or severe pain. 60 tablet 0  . ondansetron (ZOFRAN) 4 MG tablet Take 1 tablet (4 mg total) by mouth every 8 (eight) hours as needed for nausea. 30 tablet 2  . senna-docusate (SENNA S) 8.6-50 MG tablet Take 2 tablets by mouth at bedtime. 60 tablet 1   No current facility-administered medications for this visit.     REVIEW OF SYSTEMS:  10 Point review of Systems was done is negative except as noted above.  PHYSICAL EXAMINATION: ECOG PERFORMANCE STATUS: 2 - Symptomatic, <50% confined to bed  . Vitals:   12/15/16 1224  BP: 109/75  Pulse: 79  Resp: 18  Temp: 97.8 F (36.6 C)   Filed Weights   12/15/16 1224  Weight: 230 lb 9.6 oz (104.6 kg)   .Body mass index is 30.42 kg/m.  GENERAL:alert, in no acute distress and comfortable SKIN: no acute rashes, no significant lesions EYES: conjunctiva are pink and non-injected, sclera anicteric OROPHARYNX: MMM, no exudates, no oropharyngeal erythema or ulceration NECK: supple, no JVD LYMPH:  no palpable lymphadenopathy in the cervical, axillary or inguinal regions LUNGS: clear to auscultation b/l with normal respiratory effort HEART: regular rate & rhythm ABDOMEN:  normoactive bowel sounds , non tender, not distended. Extremity: no pedal edema PSYCH: alert & oriented x 3 with fluent speech NEURO: no focal motor/sensory deficits  LABORATORY DATA:  I have reviewed the data as listed  . CBC Latest Ref Rng & Units 12/15/2016 11/16/2016 10/17/2016  WBC 4.0 - 10.3 10e3/uL 4.3 4.1 5.5  Hemoglobin 13.0 - 17.1 g/dL 11.5(L) 12.0(L) 9.5(L)  Hematocrit 38.4 - 49.9 % 35.4(L) 37.2(L) 29.0(L)  Platelets 140 - 400  10e3/uL 185 185 178    . CMP Latest Ref Rng & Units 12/15/2016 11/16/2016 10/15/2016  Glucose 70 - 140 mg/dl 116 97 118(H)  BUN 7.0 - 26.0 mg/dL 11.9 12.6 22(H)  Creatinine 0.7 - 1.3 mg/dL 0.8 0.8 0.98  Sodium 136 - 145 mEq/L 135(L) 138 132(L)  Potassium 3.5 - 5.1 mEq/L 4.3 3.9 3.6  Chloride 101 - 111 mmol/L - - 94(L)  CO2 22 - 29 mEq/L 26 25 28   Calcium 8.4 - 10.4 mg/dL 9.7 10.1 9.1  Total Protein 6.4 - 8.3 g/dL 6.3(L) 6.6 5.5(L)  Total Bilirubin 0.20 - 1.20 mg/dL 0.63 0.56 1.4(H)  Alkaline Phos 40 - 150 U/L 118 116 76  AST 5 - 34 U/L 21 19 35  ALT 0 - 55 U/L 20 30 32   Component     Latest Ref Rng & Units 10/11/2016  CEA     0.0 - 4.7 ng/mL 41.6 (H)  CA 19-9     0 - 35 U/mL 1,990 (H)  AFP Tumor Marker     0.0 - 8.3 ng/mL 1.9   Component     Latest Ref Rng & Units 09/27/2016 09/29/2016 10/03/2016 10/11/2016  Vitamin B12     180 - 914 pg/mL 1,344 (H)     HIV     Non Reactive Non Reactive     PSA     0.00 - 4.00 ng/mL  1.69    LDH     125 - 245 U/L   278 (H)   CEA     0.0 - 4.7 ng/mL    41.6 (H)  CA 19-9     0 - 35 U/mL    1,990 (H)  AFP Tumor Marker     0.0 - 8.3 ng/mL    1.9  CA 19-9     0 - 35 U/mL      CEA ([facility])     0.00 - 5.00 ng/mL       Component     Latest Ref Rng & Units 11/16/2016  Vitamin B12     180 - 914 pg/mL   HIV     Non Reactive   PSA     0.00 - 4.00  ng/mL   LDH     125 - 245 U/L 200  CEA     0.0 - 4.7 ng/mL   CA 19-9     0 - 35 U/mL   AFP Tumor Marker     0.0 - 8.3 ng/mL   CA 19-9     0 - 35 U/mL 9,314 (H)  CEA ([facility])     0.00 - 5.00 ng/mL 78.96 (H)    Component     Latest Ref Rng & Units 10/11/2016  Anticardiolipin Ab,IgG,Qn     0 - 14 GPL U/mL <9  Anticardiolipin Ab,IgM,Qn     0 - 12 MPL U/mL 11  Anticardiolipin Ab,IgA,Qn     0 - 11 APL U/mL 9  Beta-2 Glycoprotein I Ab, IgG     0 - 20 GPI IgG units <9  Beta-2-Glycoprotein I IgM     0 - 32 GPI IgM units <9  Beta-2-Glycoprotein I IgA     0 - 25 GPI IgA units  <9  PTT Lupus Anticoagulant     0.0 - 51.9 sec 32.7  DRVVT     0.0 - 47.0 sec 58.9 (H)  Lupus Anticoag Interp      Comment:  Recommendations-F5LEID:      Comment  Comment      Comment  Recommendations-PTGENE:      Comment  Additional Information      Comment  dRVVT Mix     0.0 - 47.0 sec 44.3   Lupus anticoagulant panel      The value has a corrected status.      No reference range information available      Resulting Lab: [facility]      Comments:           (NOTE)           No lupus anticoagulant was detected.   Prothrombin gene mutation      No reference range information available      Resulting Lab: [facility]      Comments:           (NOTE)           NEGATIVE           No mutation identified.  Factor 5 leiden      No reference range information available      Resulting Lab: [facility]      Comments:           (NOTE)           Result:  Negative (no mutation found)    RADIOGRAPHIC STUDIES: I have personally reviewed the radiological images as listed and agreed with the findings in the report. Ct Abdomen Pelvis W Wo Contrast  Result Date: 11/30/2016 CLINICAL DATA:  Abdominal lymphadenopathy. EXAM: CT ABDOMEN AND PELVIS WITHOUT AND WITH CONTRAST TECHNIQUE: Multidetector CT imaging of the abdomen and pelvis was performed following the standard protocol before and following the bolus administration of intravenous contrast. CONTRAST:  70m ISOVUE-300 IOPAMIDOL (ISOVUE-300) INJECTION 61%, 1 ISOVUE-300 IOPAMIDOL (ISOVUE-300) INJECTION 61% COMPARISON:  PET-CT 10/10/2016.  CT scan from 09/28/2016. FINDINGS: Lower chest: The left lower lobe dependent atelectasis with small effusions seen previously has decreased in the interval, but this resolution has exposed any 1.6 x 1.5 cm nodular area in the deep posterior left costophrenic sulcus. Hepatobiliary: 1.3 cm lesion anterior subcapsular aspect of the lateral segment left liver is new in the interval. 2.0 cm lesion in the posterior  hepatic dome (  image 7 series 4) is new. Multiple other new hepatic lesions are identified in both lobes, measuring in the 5-10 mm size range. There is no evidence for gallstones, gallbladder wall thickening, or pericholecystic fluid. No intrahepatic or extrahepatic biliary dilation. Pancreas: No focal mass lesion. No dilatation of the main duct. No intraparenchymal cyst. No peripancreatic edema. Spleen: No splenomegaly. No focal mass lesion. Adrenals/Urinary Tract: No adrenal nodule or mass. Kidneys are unremarkable. No evidence for hydroureter. The urinary bladder appears normal for the degree of distention. Stomach/Bowel: Stomach is better distended on today's study than previous CT and PET studies. A bilobed lesion with apparent central necrosis is identified in the proximal aspect of the greater curvature gastric wall (image 11 series 4) measuring 5.2 x 1.9 cm. Duodenum is normally positioned as is the ligament of Treitz. No small bowel wall thickening. No small bowel dilatation. The terminal ileum is normal. The appendix is not visualized, but there is no edema or inflammation in the region of the cecum. No gross colonic mass. No colonic wall thickening. No substantial diverticular change. Vascular/Lymphatic: There is abdominal aortic atherosclerosis without aneurysm. 2.3 cm gastrohepatic ligament lymph nodes seen on the PET-CT now measures 3.2 cm. 2.5 cm short axis hepatoduodenal ligament lymph node is not substantially changed since prior PET-CT. 2.5 cm short axis portal caval lymph node was 1.8 cm on prior PET-CT. Retroperitoneal lymphadenopathy has progressed in the interval. 2.1 cm short axis aortocaval lymph node was 1.9 cm previously. 1.7 cm short axis left periaortic lymph nodes seen image 37 series 4 was only about 8 mm on the prior PET-CT. There is new the periaortic soft tissue stranding/ edema that tracks caudally into the pelvis extends along the iliac vasculature of both pelvic sidewalls.  Presacral edema is also new in the interval. Reproductive: Prostate gland upper normal to mildly enlarged. Other: No intraperitoneal free fluid. Musculoskeletal: Bone windows reveal no worrisome lytic or sclerotic osseous lesions. IMPRESSION: 1. Better distention of the stomach today shows a bilobed lesion or two immediately adjacent lesions of the greater curvature of the stomach, in the region the abnormal PET activity seen on PET-CT. 2. 1.6 cm nodule posterior left lung base concerning for metastatic disease, appearing new in the interval. 3. Multiple new hepatic metastases. 4. Interval progression of gastrohepatic, hepatoduodenal, and retroperitoneal necrotic lymphadenopathy consistent with metastatic disease. 5. Interval development of retroperitoneal edema around the aorta extending down into both pelvic sidewalls, presumably secondary to the retroperitoneal lymphadenopathy. 6.  Abdominal Aortic Atherosclerois (ICD10-170.0) Electronically Signed   By: Misty Stanley M.D.   On: 11/30/2016 12:17   EGD 12/09/2016: - Normal larynx. - Normal esophagus. - Likely malignant gastric tumor on the greater curvature of the gastric body. Biopsied. - Normal examined duodenum.     ASSESSMENT & PLAN:   66 year old male with complicated medical situation is highly above with  1) metastatic poorly differentiated gastric adenocarcinoma Her2 neg by immunohistochemistry. Patient has multiple liver metastases, likely lung metastases, extensive abdominal adenopathy and stomach mass. MSI testing pending.  PET/CT was done on 10/10/2016 and showed Two FDG avid foci within the wall of the stomach along the posterior aspect of the greater curvature. These are fairly FDG avid SUV 12.9. No overt discrete lesions on CT. Hypermetabolic gastrohepatic ligament lymph nodes, gastrosplenic lymph nodes, porta hepatis lymph nodes and distal paraesophageal lymph nodes consistent with metastatic lymph nodes.  Repeat CT abdomen  pelvis on 11/30/2016 showed obvious stomach tumor progression of abdominal and retroperitoneal lymphadenopathy as well  as multiple liver metastases 2) increasingly elevated CA-19-9 level was 1990 on 10/11/2016 today increased to 9300. On 11/16/2016 3) elevated CEA level - 41 now upto 79. 4) Multiple thrombotic events (venous and arterial) ECHO no RT to left shunt. Extensive DVT/PE 6/95/0722 B/l embolic CVA's 12/26/5049 Anterior wall STEMI s/p LAD PCI with DES on 10/11/2016.  Hypercoagulable status likely related to his metastatic differentiated gastric adenocarcinoma. Some fhx of clotting issues in his mother per wife but negative factor V Leiden and prothrombin gene mutation. Negative workup for antiphospholipid antibody syndrome.  Plan -Patient is on Plavix and is continuing his Lovenox. No overt GI bleeding noted at this time. -The biopsy results, diagnosis, natural history, grave prognosis and treatment options were discussed in details. -We discussed the option for palliative chemotherapy versus best supportive cares. -Anthracyclines would be contraindicated in the setting of recent MI with decreased ejection fraction -Patient is HER-2/neu negative by immunohistochemistry. Even if this were positive he would be a poor candidate for Herceptin given his recent ischemic cardiomyopathy with acute ST elevation MI. -Would avoid port placement given his anticoagulation requirements and significantly elevated thrombotic risk. -I offered him treatment with Xeloda plus Oxaloplatin (reduced dose) and after significant discussion informed consent was obtained to proceed. -We discussed goals of care in detail and the fact that treatment is not curative and if tolerated might serve to delay more bothersome symptoms for a while.  Chemo-counseling in 3-5 days Planning to start [facility] in 7-10 days Labs with start of chemotherapy RTC with Dr [name] in 7 days after starting chemotherapy for a  toxicity check.   All of the patients questions were answered with apparent satisfaction. The patient knows to call the clinic with any problems, questions or concerns.  I spent 30 minutes counseling the patient face to face. The total time spent in the appointment was 45 minutes and more than 50% was on counseling and direct patient cares.    Sullivan Lone MD [place] AAHIVMS [facility] Hematology/Oncology Physician [facility]  (Office):       [phone] (Work cell):  [phone] (Fax):           [phone]

## 2016-12-19 ENCOUNTER — Encounter (HOSPITAL_COMMUNITY): Payer: Medicare Other

## 2016-12-19 ENCOUNTER — Encounter (HOSPITAL_COMMUNITY)
Admission: RE | Admit: 2016-12-19 | Discharge: 2016-12-19 | Disposition: A | Discharge status: Home / Self Care | Payer: Medicare Other | Source: Ambulatory Visit | Attending: Interventional Cardiology | Admitting: Interventional Cardiology

## 2016-12-19 DIAGNOSIS — Z955 Presence of coronary angioplasty implant and graft: Secondary | ICD-10-CM | POA: Diagnosis not present

## 2016-12-19 DIAGNOSIS — I214 Non-ST elevation (NSTEMI) myocardial infarction: Secondary | ICD-10-CM

## 2016-12-19 DIAGNOSIS — I2102 ST elevation (STEMI) myocardial infarction involving left anterior descending coronary artery: Secondary | ICD-10-CM | POA: Diagnosis not present

## 2016-12-19 NOTE — Progress Notes (Signed)
Daily Session Note  Patient Details  Name: [name] MRN: [id number] Date of Birth: 1951-04-05 Referring Provider:     CARDIAC REHAB PHASE II ORIENTATION from 11/23/2016 in [place]  Referring Provider  Dr. Tamala Julian      Encounter Date: 12/19/2016  Check In:     Session Check In - 12/19/16 0930      Check-In   Location [facility]   Staff Present Russella Dar, MS, EP, [facility], Exercise Physiologist;Gregory Luther Parody, BS, EP, Exercise Physiologist   Supervising physician immediately available to respond to emergencies See telemetry face sheet for immediately available MD   Medication changes reported     No   Fall or balance concerns reported    No   Warm-up and Cool-down Performed as group-led instruction   Resistance Training Performed Yes   VAD Patient? No     VAD patient   Has back up controller? No     Pain Assessment   Currently in Pain? No/denies   Pain Score 0-No pain      Capillary Blood Glucose: No results found for this or any previous visit (from the past 24 hour(s)).    History  Smoking Status  . Never Smoker  Smokeless Tobacco  . Never Used    Goals Met:  Independence with exercise equipment Exercise tolerated well Strength training completed today  Goals Unmet:  Not Applicable  Comments: Check out: 10:30   Dr. Kate Sable is Medical Director for [place] and Pulmonary Rehab.

## 2016-12-20 ENCOUNTER — Other Ambulatory Visit: Payer: Medicare Other

## 2016-12-20 ENCOUNTER — Encounter: Payer: Self-pay | Admitting: *Deleted

## 2016-12-20 ENCOUNTER — Telehealth: Payer: Self-pay | Admitting: Pharmacist

## 2016-12-20 DIAGNOSIS — C169 Malignant neoplasm of stomach, unspecified: Secondary | ICD-10-CM

## 2016-12-20 MED ORDER — CAPECITABINE 500 MG PO TABS: 875.0000 mg/m2 | ORAL_TABLET | Freq: Two times a day (BID) | ORAL | 1 refills | Status: DC

## 2016-12-20 NOTE — Telephone Encounter (Signed)
Oral Chemotherapy Pharmacist Encounter  Received notification from [facility] that Xeloda copayment $0 under Medicare Part B and patient's Medicare supplement as Xeloda is a [employer] medication.  Patient just completed chemotherapy education class and is going to pick-up his Xeloda from [facility] right now. Patient will have medication in hand prior to start date of 12/22/16.  Oral oncology Clinic will continue to follow.  Johny Drilling, PharmD, BCPS, BCOP 12/20/2016  11:09 AM Oral Oncology Clinic [phone]

## 2016-12-20 NOTE — Telephone Encounter (Signed)
Oral Chemotherapy Pharmacist Encounter  Received new Xeloda prescription to be taken in conjunction with oxaliplatin for metastatic gastric cancer. 12/15/16 labs reviewed, OK for treatment Post-MI EKGs reviewed, QTc's 438 msec- 500 msec, patient being followed closely by cardiology Current medication list in Epic assessed, no significant DDis with Xeloda identified  Prescription has been e-scribed to the [facility] for benefits analysis.  Oral Oncology Clinic will continue to follow.  Johny Drilling, PharmD, BCPS, BCOP 12/20/2016  9:45 AM Oral Oncology Clinic [phone]

## 2016-12-21 ENCOUNTER — Encounter (HOSPITAL_COMMUNITY): Payer: Medicare Other

## 2016-12-21 ENCOUNTER — Other Ambulatory Visit: Payer: Self-pay | Admitting: *Deleted

## 2016-12-21 DIAGNOSIS — C169 Malignant neoplasm of stomach, unspecified: Secondary | ICD-10-CM

## 2016-12-22 ENCOUNTER — Other Ambulatory Visit (HOSPITAL_BASED_OUTPATIENT_CLINIC_OR_DEPARTMENT_OTHER): Payer: Medicare Other

## 2016-12-22 ENCOUNTER — Ambulatory Visit (HOSPITAL_BASED_OUTPATIENT_CLINIC_OR_DEPARTMENT_OTHER): Payer: Medicare Other

## 2016-12-22 VITALS — BP 118/71 | HR 81 | Temp 98.4°F | Resp 20

## 2016-12-22 DIAGNOSIS — C787 Secondary malignant neoplasm of liver and intrahepatic bile duct: Secondary | ICD-10-CM | POA: Diagnosis not present

## 2016-12-22 DIAGNOSIS — C169 Malignant neoplasm of stomach, unspecified: Secondary | ICD-10-CM

## 2016-12-22 DIAGNOSIS — Z5111 Encounter for antineoplastic chemotherapy: Secondary | ICD-10-CM | POA: Diagnosis present

## 2016-12-22 LAB — CBC WITH DIFFERENTIAL/PLATELET
BASO%: 0.5 % (ref 0.0–2.0)
Basophils Absolute: 0 10*3/uL (ref 0.0–0.1)
EOS%: 1.4 % (ref 0.0–7.0)
Eosinophils Absolute: 0.1 10*3/uL (ref 0.0–0.5)
HCT: 34.4 % — ABNORMAL LOW (ref 38.4–49.9)
HGB: 11.3 g/dL — ABNORMAL LOW (ref 13.0–17.1)
LYMPH#: 0.9 10*3/uL (ref 0.9–3.3)
LYMPH%: 15.4 % (ref 14.0–49.0)
MCH: 27.1 pg — ABNORMAL LOW (ref 27.2–33.4)
MCHC: 33 g/dL (ref 32.0–36.0)
MCV: 82.2 fL (ref 79.3–98.0)
MONO#: 1 10*3/uL — ABNORMAL HIGH (ref 0.1–0.9)
MONO%: 16.8 % — AB (ref 0.0–14.0)
NEUT#: 4.1 10*3/uL (ref 1.5–6.5)
NEUT%: 65.9 % (ref 39.0–75.0)
Platelets: 201 10*3/uL (ref 140–400)
RBC: 4.19 10*6/uL — AB (ref 4.20–5.82)
RDW: 15.1 % — ABNORMAL HIGH (ref 11.0–14.6)
WBC: 6.2 10*3/uL (ref 4.0–10.3)

## 2016-12-22 LAB — COMPREHENSIVE METABOLIC PANEL
ALT: 30 U/L (ref 0–55)
AST: 38 U/L — AB (ref 5–34)
Albumin: 3.3 g/dL — ABNORMAL LOW (ref 3.5–5.0)
Alkaline Phosphatase: 144 U/L (ref 40–150)
Anion Gap: 8 mEq/L (ref 3–11)
BUN: 14.9 mg/dL (ref 7.0–26.0)
CHLORIDE: 98 meq/L (ref 98–109)
CO2: 26 meq/L (ref 22–29)
Calcium: 9.6 mg/dL (ref 8.4–10.4)
Creatinine: 0.8 mg/dL (ref 0.7–1.3)
EGFR: >90 mL/min/{1.73_m2} (ref >90)
Glucose: 117 mg/dl (ref 70–140)
POTASSIUM: 4.3 meq/L (ref 3.5–5.1)
Sodium: 132 mEq/L — ABNORMAL LOW (ref 136–145)
Total Bilirubin: 0.76 mg/dL (ref 0.20–1.20)
Total Protein: 6.5 g/dL (ref 6.4–8.3)

## 2016-12-22 MED ORDER — DEXAMETHASONE SODIUM PHOSPHATE 10 MG/ML IJ SOLN
INTRAMUSCULAR | Status: AC
  Filled 2016-12-22: qty 1

## 2016-12-22 MED ORDER — PALONOSETRON HCL INJECTION 0.25 MG/5ML
INTRAVENOUS | Status: AC
  Filled 2016-12-22: qty 5

## 2016-12-22 MED ORDER — DEXTROSE 5 % IV SOLN
Freq: Once | INTRAVENOUS | Status: AC
  Administered 2016-12-22: 14:00:00 via INTRAVENOUS

## 2016-12-22 MED ORDER — DEXAMETHASONE SODIUM PHOSPHATE 10 MG/ML IJ SOLN
10.0000 mg | Freq: Once | INTRAMUSCULAR | Status: AC
  Administered 2016-12-22: 10 mg via INTRAVENOUS

## 2016-12-22 MED ORDER — PALONOSETRON HCL INJECTION 0.25 MG/5ML
0.2500 mg | Freq: Once | INTRAVENOUS | Status: AC
  Administered 2016-12-22: 0.25 mg via INTRAVENOUS

## 2016-12-22 MED ORDER — OXALIPLATIN CHEMO INJECTION 100 MG/20ML
100.0000 mg/m2 | Freq: Once | INTRAVENOUS | Status: AC
  Administered 2016-12-22: 230 mg via INTRAVENOUS
  Filled 2016-12-22: qty 40

## 2016-12-22 MED FILL — XELODA 500 MG TABLET: 500 | 21 days supply | Qty: 112 | Fill #0

## 2016-12-22 NOTE — Patient Instructions (Addendum)
[facility] Discharge Instructions for Patients Receiving Chemotherapy  Today you received the following chemotherapy agents oxaliplatin.  To help prevent nausea and vomiting after your treatment, we encourage you to take your nausea medication as directed by your MD.   If you develop nausea and vomiting that is not controlled by your nausea medication, call the clinic.   BELOW ARE SYMPTOMS THAT SHOULD BE REPORTED IMMEDIATELY:  *FEVER GREATER THAN 100.5 F  *CHILLS WITH OR WITHOUT FEVER  NAUSEA AND VOMITING THAT IS NOT CONTROLLED WITH YOUR NAUSEA MEDICATION  *UNUSUAL SHORTNESS OF BREATH  *UNUSUAL BRUISING OR BLEEDING  TENDERNESS IN MOUTH AND THROAT WITH OR WITHOUT PRESENCE OF ULCERS  *URINARY PROBLEMS  *BOWEL PROBLEMS  UNUSUAL RASH Items with * indicate a potential emergency and should be followed up as soon as possible.  Feel free to call the clinic you have any questions or concerns. The clinic phone number is [phone].  Please show the [place] at check-in to the Emergency Department and triage nurse.  Oxaliplatin Injection What is this medicine? OXALIPLATIN (ox AL i PLA tin) is a chemotherapy drug. It targets fast dividing cells, like cancer cells, and causes these cells to die. This medicine is used to treat cancers of the colon and rectum, and many other cancers. This medicine may be used for other purposes; ask your health care provider or pharmacist if you have questions. COMMON BRAND NAME(S): Eloxatin What should I tell my health care provider before I take this medicine? They need to know if you have any of these conditions: -kidney disease -an unusual or allergic reaction to oxaliplatin, other chemotherapy, other medicines, foods, dyes, or preservatives -pregnant or trying to get pregnant -breast-feeding How should I use this medicine? This drug is given as an infusion into a vein. It is administered in a hospital or clinic by a  specially trained health care professional. Talk to your pediatrician regarding the use of this medicine in children. Special care may be needed. Overdosage: If you think you have taken too much of this medicine contact a poison control center or emergency room at once. NOTE: This medicine is only for you. Do not share this medicine with others. What if I miss a dose? It is important not to miss a dose. Call your doctor or health care professional if you are unable to keep an appointment. What may interact with this medicine? -medicines to increase blood counts like filgrastim, pegfilgrastim, sargramostim -probenecid -some antibiotics like amikacin, gentamicin, neomycin, polymyxin B, streptomycin, tobramycin -zalcitabine Talk to your doctor or health care professional before taking any of these medicines: -acetaminophen -aspirin -ibuprofen -ketoprofen -naproxen This list may not describe all possible interactions. Give your health care provider a list of all the medicines, herbs, non-prescription drugs, or dietary supplements you use. Also tell them if you smoke, drink alcohol, or use illegal drugs. Some items may interact with your medicine. What should I watch for while using this medicine? Your condition will be monitored carefully while you are receiving this medicine. You will need important blood work done while you are taking this medicine. This medicine can make you more sensitive to cold. Do not drink cold drinks or use ice. Cover exposed skin before coming in contact with cold temperatures or cold objects. When out in cold weather wear warm clothing and cover your mouth and nose to warm the air that goes into your lungs. Tell your doctor if you get sensitive to the cold. This  may make you feel generally unwell. This is not uncommon, as chemotherapy can affect healthy cells as well as cancer cells. Report any side effects. Continue your course of treatment even though you feel ill  unless your doctor tells you to stop. In some cases, you may be given additional medicines to help with side effects. Follow all directions for their use. Call your doctor or health care professional for advice if you get a fever, chills or sore throat, or other symptoms of a cold or flu. Do not treat yourself. This drug decreases your body's ability to fight infections. Try to avoid being around people who are sick. This medicine may increase your risk to bruise or bleed. Call your doctor or health care professional if you notice any unusual bleeding. Be careful brushing and flossing your teeth or using a toothpick because you may get an infection or bleed more easily. If you have any dental work done, tell your dentist you are receiving this medicine. Avoid taking products that contain aspirin, acetaminophen, ibuprofen, naproxen, or ketoprofen unless instructed by your doctor. These medicines may hide a fever. Do not become pregnant while taking this medicine. Women should inform their doctor if they wish to become pregnant or think they might be pregnant. There is a potential for serious side effects to an unborn child. Talk to your health care professional or pharmacist for more information. Do not breast-feed an infant while taking this medicine. Call your doctor or health care professional if you get diarrhea. Do not treat yourself. What side effects may I notice from receiving this medicine? Side effects that you should report to your doctor or health care professional as soon as possible: -allergic reactions like skin rash, itching or hives, swelling of the face, lips, or tongue -low blood counts - This drug may decrease the number of white blood cells, red blood cells and platelets. You may be at increased risk for infections and bleeding. -signs of infection - fever or chills, cough, sore throat, pain or difficulty passing urine -signs of decreased platelets or bleeding - bruising, pinpoint  red spots on the skin, black, tarry stools, nosebleeds -signs of decreased red blood cells - unusually weak or tired, fainting spells, lightheadedness -breathing problems -chest pain, pressure -cough -diarrhea -jaw tightness -mouth sores -nausea and vomiting -pain, swelling, redness or irritation at the injection site -pain, tingling, numbness in the hands or feet -problems with balance, talking, walking -redness, blistering, peeling or loosening of the skin, including inside the mouth -trouble passing urine or change in the amount of urine Side effects that usually do not require medical attention (report to your doctor or health care professional if they continue or are bothersome): -changes in vision -constipation -hair loss -loss of appetite -metallic taste in the mouth or changes in taste -stomach pain This list may not describe all possible side effects. Call your doctor for medical advice about side effects. You may report side effects to FDA at 1-800-FDA-1088. Where should I keep my medicine? This drug is given in a hospital or clinic and will not be stored at home. NOTE: This sheet is a summary. It may not cover all possible information. If you have questions about this medicine, talk to your doctor, pharmacist, or health care provider.  2018 Elsevier/Gold Standard (2008-03-04 17:22:47)  

## 2016-12-23 ENCOUNTER — Encounter (HOSPITAL_COMMUNITY): Admission: RE | Admit: 2016-12-23 | Payer: Medicare Other | Source: Ambulatory Visit

## 2016-12-23 DIAGNOSIS — Z7189 Other specified counseling: Secondary | ICD-10-CM | POA: Insufficient documentation

## 2016-12-26 ENCOUNTER — Other Ambulatory Visit: Payer: Self-pay | Admitting: *Deleted

## 2016-12-26 ENCOUNTER — Telehealth: Payer: Self-pay | Admitting: *Deleted

## 2016-12-26 ENCOUNTER — Telehealth: Payer: Self-pay

## 2016-12-26 ENCOUNTER — Encounter (HOSPITAL_COMMUNITY): Admission: RE | Admit: 2016-12-26 | Payer: Medicare Other | Source: Ambulatory Visit

## 2016-12-26 MED ORDER — MORPHINE SULFATE 15 MG PO TABS: 15.0000 mg | ORAL_TABLET | ORAL | 0 refills | Status: DC | PRN

## 2016-12-26 NOTE — Telephone Encounter (Signed)
-----   Message from Herschell Dimes, RN sent at 12/22/2016  5:19 PM EDT ----- Regarding: [name] - first time [place] Contact: [phone] Dr. Grier Mitts  Patient [name] tolerated his Oxaliplatin well today.  Please call [phone]

## 2016-12-26 NOTE — Telephone Encounter (Signed)
Family called for morphine refill

## 2016-12-28 ENCOUNTER — Encounter (HOSPITAL_COMMUNITY): Payer: Medicare Other

## 2016-12-28 ENCOUNTER — Other Ambulatory Visit: Payer: Self-pay | Admitting: *Deleted

## 2016-12-28 DIAGNOSIS — C169 Malignant neoplasm of stomach, unspecified: Secondary | ICD-10-CM

## 2016-12-29 ENCOUNTER — Encounter (HOSPITAL_COMMUNITY): Payer: Self-pay | Admitting: Emergency Medicine

## 2016-12-29 ENCOUNTER — Ambulatory Visit: Payer: Medicare Other | Admitting: Hematology

## 2016-12-29 ENCOUNTER — Other Ambulatory Visit: Payer: Self-pay

## 2016-12-29 ENCOUNTER — Other Ambulatory Visit (HOSPITAL_COMMUNITY): Payer: Self-pay

## 2016-12-29 ENCOUNTER — Emergency Department (HOSPITAL_COMMUNITY): Payer: Medicare Other

## 2016-12-29 ENCOUNTER — Inpatient Hospital Stay (HOSPITAL_COMMUNITY)
Admission: EM | Admit: 2016-12-29 | Discharge: 2017-01-03 | DRG: 291 | Disposition: A | Discharge status: Hospice | Payer: Medicare Other | Attending: Internal Medicine | Admitting: Internal Medicine

## 2016-12-29 ENCOUNTER — Other Ambulatory Visit: Payer: Medicare Other

## 2016-12-29 DIAGNOSIS — Z7902 Long term (current) use of antithrombotics/antiplatelets: Secondary | ICD-10-CM

## 2016-12-29 DIAGNOSIS — I248 Other forms of acute ischemic heart disease: Secondary | ICD-10-CM | POA: Diagnosis present

## 2016-12-29 DIAGNOSIS — C787 Secondary malignant neoplasm of liver and intrahepatic bile duct: Secondary | ICD-10-CM | POA: Diagnosis present

## 2016-12-29 DIAGNOSIS — N171 Acute kidney failure with acute cortical necrosis: Secondary | ICD-10-CM | POA: Diagnosis not present

## 2016-12-29 DIAGNOSIS — R531 Weakness: Secondary | ICD-10-CM | POA: Diagnosis not present

## 2016-12-29 DIAGNOSIS — I5023 Acute on chronic systolic (congestive) heart failure: Secondary | ICD-10-CM | POA: Diagnosis not present

## 2016-12-29 DIAGNOSIS — Z86718 Personal history of other venous thrombosis and embolism: Secondary | ICD-10-CM | POA: Diagnosis not present

## 2016-12-29 DIAGNOSIS — R079 Chest pain, unspecified: Secondary | ICD-10-CM | POA: Diagnosis not present

## 2016-12-29 DIAGNOSIS — J90 Pleural effusion, not elsewhere classified: Secondary | ICD-10-CM

## 2016-12-29 DIAGNOSIS — F329 Major depressive disorder, single episode, unspecified: Secondary | ICD-10-CM | POA: Diagnosis present

## 2016-12-29 DIAGNOSIS — D6859 Other primary thrombophilia: Secondary | ICD-10-CM | POA: Diagnosis present

## 2016-12-29 DIAGNOSIS — I429 Cardiomyopathy, unspecified: Secondary | ICD-10-CM | POA: Diagnosis not present

## 2016-12-29 DIAGNOSIS — J9601 Acute respiratory failure with hypoxia: Secondary | ICD-10-CM | POA: Diagnosis present

## 2016-12-29 DIAGNOSIS — I679 Cerebrovascular disease, unspecified: Secondary | ICD-10-CM | POA: Diagnosis not present

## 2016-12-29 DIAGNOSIS — I5041 Acute combined systolic (congestive) and diastolic (congestive) heart failure: Secondary | ICD-10-CM

## 2016-12-29 DIAGNOSIS — R0609 Other forms of dyspnea: Secondary | ICD-10-CM | POA: Diagnosis not present

## 2016-12-29 DIAGNOSIS — R846 Abnormal cytological findings in specimens from respiratory organs and thorax: Secondary | ICD-10-CM | POA: Diagnosis not present

## 2016-12-29 DIAGNOSIS — Z8673 Personal history of transient ischemic attack (TIA), and cerebral infarction without residual deficits: Secondary | ICD-10-CM

## 2016-12-29 DIAGNOSIS — I10 Essential (primary) hypertension: Secondary | ICD-10-CM | POA: Diagnosis not present

## 2016-12-29 DIAGNOSIS — I2510 Atherosclerotic heart disease of native coronary artery without angina pectoris: Secondary | ICD-10-CM | POA: Diagnosis present

## 2016-12-29 DIAGNOSIS — R748 Abnormal levels of other serum enzymes: Secondary | ICD-10-CM

## 2016-12-29 DIAGNOSIS — G4733 Obstructive sleep apnea (adult) (pediatric): Secondary | ICD-10-CM | POA: Diagnosis present

## 2016-12-29 DIAGNOSIS — C169 Malignant neoplasm of stomach, unspecified: Secondary | ICD-10-CM | POA: Diagnosis present

## 2016-12-29 DIAGNOSIS — I519 Heart disease, unspecified: Secondary | ICD-10-CM | POA: Diagnosis not present

## 2016-12-29 DIAGNOSIS — I110 Hypertensive heart disease with heart failure: Secondary | ICD-10-CM | POA: Diagnosis not present

## 2016-12-29 DIAGNOSIS — Z86711 Personal history of pulmonary embolism: Secondary | ICD-10-CM

## 2016-12-29 DIAGNOSIS — I509 Heart failure, unspecified: Secondary | ICD-10-CM | POA: Diagnosis not present

## 2016-12-29 DIAGNOSIS — R0602 Shortness of breath: Secondary | ICD-10-CM

## 2016-12-29 DIAGNOSIS — E785 Hyperlipidemia, unspecified: Secondary | ICD-10-CM | POA: Diagnosis present

## 2016-12-29 DIAGNOSIS — I252 Old myocardial infarction: Secondary | ICD-10-CM

## 2016-12-29 DIAGNOSIS — Z79899 Other long term (current) drug therapy: Secondary | ICD-10-CM

## 2016-12-29 DIAGNOSIS — I255 Ischemic cardiomyopathy: Secondary | ICD-10-CM | POA: Diagnosis present

## 2016-12-29 DIAGNOSIS — C7800 Secondary malignant neoplasm of unspecified lung: Secondary | ICD-10-CM | POA: Diagnosis present

## 2016-12-29 DIAGNOSIS — I77819 Aortic ectasia, unspecified site: Secondary | ICD-10-CM | POA: Diagnosis present

## 2016-12-29 DIAGNOSIS — I2690 Septic pulmonary embolism without acute cor pulmonale: Secondary | ICD-10-CM | POA: Diagnosis not present

## 2016-12-29 DIAGNOSIS — Z515 Encounter for palliative care: Secondary | ICD-10-CM | POA: Diagnosis not present

## 2016-12-29 DIAGNOSIS — R52 Pain, unspecified: Secondary | ICD-10-CM | POA: Diagnosis not present

## 2016-12-29 DIAGNOSIS — K219 Gastro-esophageal reflux disease without esophagitis: Secondary | ICD-10-CM | POA: Diagnosis present

## 2016-12-29 DIAGNOSIS — I5043 Acute on chronic combined systolic (congestive) and diastolic (congestive) heart failure: Secondary | ICD-10-CM | POA: Diagnosis not present

## 2016-12-29 DIAGNOSIS — Z955 Presence of coronary angioplasty implant and graft: Secondary | ICD-10-CM | POA: Diagnosis not present

## 2016-12-29 DIAGNOSIS — J910 Malignant pleural effusion: Secondary | ICD-10-CM | POA: Diagnosis present

## 2016-12-29 DIAGNOSIS — F3341 Major depressive disorder, recurrent, in partial remission: Secondary | ICD-10-CM

## 2016-12-29 DIAGNOSIS — Z7901 Long term (current) use of anticoagulants: Secondary | ICD-10-CM | POA: Diagnosis not present

## 2016-12-29 DIAGNOSIS — I5021 Acute systolic (congestive) heart failure: Secondary | ICD-10-CM | POA: Diagnosis not present

## 2016-12-29 DIAGNOSIS — Z66 Do not resuscitate: Secondary | ICD-10-CM | POA: Diagnosis present

## 2016-12-29 DIAGNOSIS — R5383 Other fatigue: Secondary | ICD-10-CM | POA: Diagnosis not present

## 2016-12-29 DIAGNOSIS — I361 Nonrheumatic tricuspid (valve) insufficiency: Secondary | ICD-10-CM | POA: Diagnosis not present

## 2016-12-29 DIAGNOSIS — R7989 Other specified abnormal findings of blood chemistry: Secondary | ICD-10-CM

## 2016-12-29 DIAGNOSIS — R778 Other specified abnormalities of plasma proteins: Secondary | ICD-10-CM | POA: Diagnosis present

## 2016-12-29 DIAGNOSIS — E871 Hypo-osmolality and hyponatremia: Secondary | ICD-10-CM | POA: Diagnosis present

## 2016-12-29 LAB — CBC
HCT: 34.4 % — ABNORMAL LOW (ref 39.0–52.0)
Hemoglobin: 11.3 g/dL — ABNORMAL LOW (ref 13.0–17.0)
MCH: 26.9 pg (ref 26.0–34.0)
MCHC: 32.8 g/dL (ref 30.0–36.0)
MCV: 81.9 fL (ref 78.0–100.0)
PLATELETS: 215 10*3/uL (ref 150–400)
RBC: 4.2 MIL/uL — AB (ref 4.22–5.81)
RDW: 14.5 % (ref 11.5–15.5)
WBC: 8.8 10*3/uL (ref 4.0–10.5)

## 2016-12-29 LAB — I-STAT CHEM 8, ED
BUN: 30 mg/dL — AB (ref 6–20)
CALCIUM ION: 1.14 mmol/L — AB (ref 1.15–1.40)
Chloride: 95 mmol/L — ABNORMAL LOW (ref 101–111)
Creatinine, Ser: 1.1 mg/dL (ref 0.61–1.24)
GLUCOSE: 145 mg/dL — AB (ref 65–99)
HEMATOCRIT: 35 % — AB (ref 39.0–52.0)
Hemoglobin: 11.9 g/dL — ABNORMAL LOW (ref 13.0–17.0)
Potassium: 4.7 mmol/L (ref 3.5–5.1)
SODIUM: 128 mmol/L — AB (ref 135–145)
TCO2: 23 mmol/L (ref 0–100)

## 2016-12-29 LAB — APTT
APTT: 45 s — AB (ref 24–36)
aPTT: 81 seconds — ABNORMAL HIGH (ref 24–36)

## 2016-12-29 LAB — PROTIME-INR
INR: 2.65
PROTHROMBIN TIME: 28.8 s — AB (ref 11.4–15.2)

## 2016-12-29 LAB — DIFFERENTIAL
BASOS PCT: 0 %
Basophils Absolute: 0 10*3/uL (ref 0.0–0.1)
EOS ABS: 0 10*3/uL (ref 0.0–0.7)
EOS PCT: 0 %
Lymphocytes Relative: 14 %
Lymphs Abs: 1.2 10*3/uL (ref 0.7–4.0)
MONO ABS: 1.2 10*3/uL — AB (ref 0.1–1.0)
MONOS PCT: 14 %
Neutro Abs: 6.3 10*3/uL (ref 1.7–7.7)
Neutrophils Relative %: 72 %

## 2016-12-29 LAB — TYPE AND SCREEN
ABO/RH(D): O POS
ANTIBODY SCREEN: NEGATIVE

## 2016-12-29 LAB — BASIC METABOLIC PANEL
ANION GAP: 12 (ref 5–15)
BUN: 33 mg/dL — ABNORMAL HIGH (ref 6–20)
CALCIUM: 9.4 mg/dL (ref 8.9–10.3)
CHLORIDE: 94 mmol/L — AB (ref 101–111)
CO2: 22 mmol/L (ref 22–32)
Creatinine, Ser: 1.1 mg/dL (ref 0.61–1.24)
GFR calc Af Amer: >60 mL/min (ref >60)
GFR calc non Af Amer: >60 mL/min (ref >60)
GLUCOSE: 155 mg/dL — AB (ref 65–99)
POTASSIUM: 4.8 mmol/L (ref 3.5–5.1)
Sodium: 128 mmol/L — ABNORMAL LOW (ref 135–145)

## 2016-12-29 LAB — URINALYSIS, ROUTINE W REFLEX MICROSCOPIC
Bilirubin Urine: NEGATIVE
Glucose, UA: NEGATIVE mg/dL
Hgb urine dipstick: NEGATIVE
KETONES UR: NEGATIVE mg/dL
LEUKOCYTES UA: NEGATIVE
NITRITE: NEGATIVE
PH: 6 (ref 5.0–8.0)
PROTEIN: NEGATIVE mg/dL
Specific Gravity, Urine: 1.018 (ref 1.005–1.030)

## 2016-12-29 LAB — I-STAT TROPONIN, ED: TROPONIN I, POC: 1.11 ng/mL — AB (ref 0.00–0.08)

## 2016-12-29 LAB — HEPATIC FUNCTION PANEL
ALT: 68 U/L — AB (ref 17–63)
AST: 72 U/L — AB (ref 15–41)
Albumin: 3.6 g/dL (ref 3.5–5.0)
Alkaline Phosphatase: 126 U/L (ref 38–126)
BILIRUBIN INDIRECT: 0.7 mg/dL (ref 0.3–0.9)
BILIRUBIN TOTAL: 1.1 mg/dL (ref 0.3–1.2)
Bilirubin, Direct: 0.4 mg/dL (ref 0.1–0.5)
Total Protein: 6.6 g/dL (ref 6.5–8.1)

## 2016-12-29 LAB — BRAIN NATRIURETIC PEPTIDE: B NATRIURETIC PEPTIDE 5: 1472 pg/mL — AB (ref 0.0–100.0)

## 2016-12-29 LAB — GLUCOSE, CAPILLARY: Glucose-Capillary: 124 mg/dL — ABNORMAL HIGH (ref 65–99)

## 2016-12-29 LAB — LIPASE, BLOOD: Lipase: 22 U/L (ref 11–51)

## 2016-12-29 LAB — ABO/RH: ABO/RH(D): O POS

## 2016-12-29 LAB — MRSA PCR SCREENING: MRSA BY PCR: NEGATIVE

## 2016-12-29 LAB — HEPARIN LEVEL (UNFRACTIONATED): Heparin Unfractionated: >2.2 IU/mL — ABNORMAL HIGH (ref 0.30–0.70)

## 2016-12-29 LAB — I-STAT CG4 LACTIC ACID, ED: LACTIC ACID, VENOUS: 2.29 mmol/L — AB (ref 0.5–1.9)

## 2016-12-29 LAB — TROPONIN I: Troponin I: 1.7 ng/mL (ref <0.03)

## 2016-12-29 MED ORDER — HEPARIN (PORCINE) IN NACL 100-0.45 UNIT/ML-% IJ SOLN
1400.0000 [IU]/h | INTRAMUSCULAR | Status: DC
  Administered 2016-12-29 – 2017-01-01 (×3): 1400 [IU]/h via INTRAVENOUS
  Filled 2016-12-29 (×6): qty 250

## 2016-12-29 MED ORDER — ONDANSETRON HCL 4 MG/2ML IJ SOLN
4.0000 mg | Freq: Four times a day (QID) | INTRAMUSCULAR | Status: DC | PRN
  Administered 2016-12-31: 4 mg via INTRAVENOUS
  Filled 2016-12-29: qty 2

## 2016-12-29 MED ORDER — CARVEDILOL 6.25 MG PO TABS
6.2500 mg | ORAL_TABLET | Freq: Two times a day (BID) | ORAL | Status: DC
  Administered 2016-12-29 – 2017-01-03 (×10): 6.25 mg via ORAL
  Filled 2016-12-29 (×10): qty 1

## 2016-12-29 MED ORDER — NITROGLYCERIN 0.4 MG SL SUBL: 0.4000 mg | SUBLINGUAL_TABLET | SUBLINGUAL | Status: DC | PRN

## 2016-12-29 MED ORDER — IOPAMIDOL (ISOVUE-370) INJECTION 76%
INTRAVENOUS | Status: AC
  Administered 2016-12-29: 75 mL via INTRAVENOUS
  Filled 2016-12-29: qty 100

## 2016-12-29 MED ORDER — MORPHINE SULFATE 15 MG PO TABS: 15.0000 mg | ORAL_TABLET | ORAL | Status: DC | PRN

## 2016-12-29 MED ORDER — PANTOPRAZOLE SODIUM 40 MG PO TBEC
40.0000 mg | DELAYED_RELEASE_TABLET | Freq: Every day | ORAL | Status: DC
  Administered 2016-12-29 – 2017-01-02 (×5): 40 mg via ORAL
  Filled 2016-12-29 (×5): qty 1

## 2016-12-29 MED ORDER — CAPECITABINE 500 MG PO TABS: 875.0000 mg/m2 | ORAL_TABLET | Freq: Two times a day (BID) | ORAL | Status: DC

## 2016-12-29 MED ORDER — DRONABINOL 2.5 MG PO CAPS
5.0000 mg | ORAL_CAPSULE | Freq: Two times a day (BID) | ORAL | Status: DC
  Administered 2016-12-29: 5 mg via ORAL
  Filled 2016-12-29 (×2): qty 2

## 2016-12-29 MED ORDER — SENNOSIDES-DOCUSATE SODIUM 8.6-50 MG PO TABS
2.0000 | ORAL_TABLET | Freq: Every day | ORAL | Status: DC
  Administered 2016-12-29 – 2017-01-01 (×4): 2 via ORAL
  Filled 2016-12-29 (×4): qty 2

## 2016-12-29 MED ORDER — CLOPIDOGREL BISULFATE 75 MG PO TABS
75.0000 mg | ORAL_TABLET | Freq: Every day | ORAL | Status: DC
  Administered 2016-12-30 – 2017-01-03 (×5): 75 mg via ORAL
  Filled 2016-12-29 (×5): qty 1

## 2016-12-29 MED ORDER — ASPIRIN EC 81 MG PO TBEC
81.0000 mg | DELAYED_RELEASE_TABLET | Freq: Every day | ORAL | Status: DC
  Administered 2016-12-30 – 2017-01-03 (×5): 81 mg via ORAL
  Filled 2016-12-29 (×5): qty 1

## 2016-12-29 MED ORDER — ATORVASTATIN CALCIUM 80 MG PO TABS
80.0000 mg | ORAL_TABLET | Freq: Every evening | ORAL | Status: DC
  Administered 2016-12-29 – 2017-01-02 (×5): 80 mg via ORAL
  Filled 2016-12-29 (×5): qty 1

## 2016-12-29 MED ORDER — TRAMADOL HCL 50 MG PO TABS
50.0000 mg | ORAL_TABLET | Freq: Four times a day (QID) | ORAL | Status: DC | PRN
  Administered 2016-12-29: 50 mg via ORAL
  Filled 2016-12-29: qty 1

## 2016-12-29 MED ORDER — FUROSEMIDE 10 MG/ML IJ SOLN
40.0000 mg | Freq: Four times a day (QID) | INTRAMUSCULAR | Status: DC
  Administered 2016-12-29 – 2016-12-30 (×3): 40 mg via INTRAVENOUS
  Filled 2016-12-29 (×3): qty 4

## 2016-12-29 MED ORDER — SODIUM CHLORIDE 0.9 % IV BOLUS (SEPSIS)
500.0000 mL | Freq: Once | INTRAVENOUS | Status: AC
  Administered 2016-12-29: 500 mL via INTRAVENOUS

## 2016-12-29 MED ORDER — DRONABINOL 5 MG PO CAPS: 5.0000 mg | ORAL_CAPSULE | Freq: Two times a day (BID) | ORAL | Status: DC

## 2016-12-29 MED ORDER — ESCITALOPRAM OXALATE 10 MG PO TABS
20.0000 mg | ORAL_TABLET | Freq: Every day | ORAL | Status: DC
  Administered 2016-12-30: 20 mg via ORAL
  Filled 2016-12-29: qty 2

## 2016-12-29 NOTE — ED Notes (Signed)
Abnormal lab result MD Pfeiffer and RN Rosana Hoes have been made aware

## 2016-12-29 NOTE — Consult Note (Signed)
CARDIOLOGY CONSULT NOTE   Patient ID: [name] MRN: [id number], DOB/AGE: 1950-11-09   Admit date: 12/29/2016 Date of Consult: 12/29/2016  Primary Physician: Dettinger, Fransisca Kaufmann, MD Primary Cardiologist:   Reason for consult:  Chest pain  Problem List  Past Medical History:  Diagnosis Date  . Anginal pain ([place])   . CAD in native artery    a. STEMI 09/2016 s/p DES to mLAD, residual 65-75% OM1 disease treated medically. EF 30-35% by cath, 35-40% by echo at dc.  . Chronic systolic CHF (congestive heart failure) ([place])    a. Dx 09/2016 - EF 30-35% by cath, 35-40% by echo.  . Depression   . Dilatation of aorta ([place])    a. CT 09/2016: dilation of the ascending thoracic aorta measuring 4.4 cm in maximum diameter, recommend f/u 2021 if appropriate.  [name] Kitchen DVT (deep venous thrombosis) ([place])    a. Bilat DVT noted 08/2016.  . Essential hypertension   . GERD (gastroesophageal reflux disease)   . 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 History of stroke    a. 09/2016.  [name] Kitchen Hypercoagulable state ([place])   . Hyperlipidemia   . Hyponatremia   . Ischemic cardiomyopathy   . Lymphadenopathy    a. Abd lymphadenopathy concerning for malignancy noted in 09/2016.  [name] Kitchen Normocytic anemia   . PE (pulmonary thromboembolism) ([place])    a. Bilat PE 08/2016.  . Right leg weakness    secondary to stroke  . Sleep apnea   . Stroke ([place])   . Suspected sleep apnea     Past Surgical History:  Procedure Laterality Date  . adnoidectomy    . APPENDECTOMY    . CORONARY STENT INTERVENTION N/A 10/11/2016   Procedure: Coronary Stent Intervention;  Surgeon: Belva Crome, MD;  Location: [facility];  Service: Cardiovascular;  Laterality: N/A;  . ESOPHAGOGASTRODUODENOSCOPY N/A 12/09/2016   Procedure: ESOPHAGOGASTRODUODENOSCOPY (EGD);  Surgeon: Doran Stabler, MD;  Location: [facility];  Service: Gastroenterology;  Laterality: N/A;  . LEFT HEART CATH AND CORONARY ANGIOGRAPHY N/A 10/11/2016   Procedure: Left  Heart Cath and Coronary Angiography;  Surgeon: Belva Crome, MD;  Location: [facility];  Service: Cardiovascular;  Laterality: N/A;  . PROSTATE SURGERY    . TONSILLECTOMY AND ADENOIDECTOMY       Allergies  No Known Allergies  HPI   [name] is a 66 y.o. male who is being seen today for the evaluation of chest pain at the request of ED physician Dr Charlesetta Shanks.  66 y.o. male with recent DVTs/PE, recent stroke, HTN, HLD, GERD, depression/bipolar disorder, probable OSA, TURP 2010, CAD (prior nonobstructive LAD stenosis in [place]), admitted 1/20-1/24/18 with multiple bilateral PEs and bilateral DVTs. He was discharged on Xarelto. He was admitted 2/4-09/29/16 with a stroke at which time he was switched to Eliquis.  He was admitted on 10/11/2016 with acute STEMI with occluded mLAD and 65-75% OM1, otherwise patent Cx and RCA, EF 30-35%. His mLAD was stented with a Synergy DES. He was started on ASA/Plavix given his need for anticoagulation, ASA was discontinued after 1 month. Troponin at the time >65.  He was also diagnosed with an aggressive metastatic gastric adenocarcinoma. He started chemotherapy on Friday and has felt miserable ever since, progressively worse every day with no energy, no appetite, SOB, this morning he felt like he couldn't even stand and developed chest pain, he came to the ER. Chest pain constant, SOB  at rest, also admits to 3 pillow orthopnea, PND, subjective fever 2 days ago.  Inpatient Medications  . [START ON 12/30/2016] aspirin EC  81 mg Oral Daily  . atorvastatin  80 mg Oral QPM  . carvedilol  6.25 mg Oral BID  . [START ON 12/30/2016] clopidogrel  75 mg Oral Daily  . [START ON 12/30/2016] escitalopram  20 mg Oral Daily  . pantoprazole  40 mg Oral QHS  . senna-docusate  2 tablet Oral QHS    Family History Family History  Problem Relation Age of Onset  . COPD Mother   . Heart disease Mother   . AAA (abdominal aortic aneurysm) Mother   . Heart  disease Father   . Depression Father   . Diabetes Father   . Heart disease Brother   . Early death Brother        heart attack  . Heart disease Maternal Grandmother   . Heart disease Maternal Grandfather   . Stroke Paternal Grandmother   . Heart disease Brother   . Diabetes Brother      Social History Social History   Social History  . Marital status: Married    Spouse name: N/A  . Number of children: N/A  . Years of education: N/A   Occupational History  . Not on file.   Social History Main Topics  . Smoking status: Never Smoker  . Smokeless tobacco: Never Used  . Alcohol use No  . Drug use: No  . Sexual activity: Yes    Birth control/ protection: Post-menopausal     Comment: Married for 16 years   Other Topics Concern  . Not on file   Social History Narrative   Epworth Sleepiness Scale = 12 (as of 09/09/2015)     Review of Systems  General:  No chills, fever, night sweats or weight changes.  Cardiovascular:  No chest pain, dyspnea on exertion, edema, orthopnea, palpitations, paroxysmal nocturnal dyspnea. Dermatological: No rash, lesions/masses Respiratory: No cough, dyspnea Urologic: No hematuria, dysuria Abdominal:   No nausea, vomiting, diarrhea, bright red blood per rectum, melena, or hematemesis Neurologic:  No visual changes, wkns, changes in mental status. All other systems reviewed and are otherwise negative except as noted above.  Physical Exam  Blood pressure 113/67, pulse 90, temperature 97.5 F (36.4 C), temperature source Oral, resp. rate (!) 22, height 6\' 1"  (1.854 m), weight 230 lb 9.6 oz (104.6 kg), SpO2 99 %.  General: Pleasant, NAD Psych: Normal affect. Neuro: Alert and oriented X 3. Moves all extremities spontaneously. HEENT: Normal  Neck: Supple without bruits or JVD. Lungs:  Resp regular and unlabored, decreased BS at base and crackles up to mid lungs Heart: RRR no s3, s4, or murmurs. Abdomen: Soft, non-tender, non-distended, BS + x  4.  Extremities: No clubbing, cyanosis or edema. DP/PT/Radials 2+ and equal bilaterally.  Labs   Recent Labs  12/29/16 1042  TROPONINI 1.70*   Lab Results  Component Value Date   WBC 8.8 12/29/2016   HGB 11.9 (L) 12/29/2016   HCT 35.0 (L) 12/29/2016   MCV 81.9 12/29/2016   PLT 215 12/29/2016    Recent Labs Lab 12/29/16 1006 12/29/16 1048  NA 128* 128*  K 4.8 4.7  CL 94* 95*  CO2 22  --   BUN 33* 30*  CREATININE 1.10 1.10  CALCIUM 9.4  --   PROT 6.6  --   BILITOT 1.1  --   ALKPHOS 126  --   ALT 68*  --  AST 72*  --   GLUCOSE 155* 145*   Lab Results  Component Value Date   CHOL 131 10/11/2016   HDL 37 (L) 10/11/2016   LDLCALC 68 10/11/2016   TRIG 131 10/11/2016   No results found for: DDIMER Invalid input(s): POCBNP  Radiology/Studies  Ct Abdomen Pelvis W Wo Contrast  Result Date: 11/30/2016 CLINICAL DATA:  Abdominal lymphadenopathy. EXAM: CT ABDOMEN AND PELVIS WITHOUT AND WITH CONTRAST TECHNIQUE: Multidetector CT imaging of the abdomen and pelvis was performed following the standard protocol before and following the bolus administration of intravenous contrast. CONTRAST:  20mL ISOVUE-300 IOPAMIDOL (ISOVUE-300) INJECTION 61%, 1 ISOVUE-300 IOPAMIDOL (ISOVUE-300) INJECTION 61% COMPARISON:  PET-CT 10/10/2016.  CT scan from 09/28/2016. FINDINGS: Lower chest: The left lower lobe dependent atelectasis with small effusions seen previously has decreased in the interval, but this resolution has exposed any 1.6 x 1.5 cm nodular area in the deep posterior left costophrenic sulcus. Hepatobiliary: 1.3 cm lesion anterior subcapsular aspect of the lateral segment left liver is new in the interval. 2.0 cm lesion in the posterior hepatic dome (image 7 series 4) is new. Multiple other new hepatic lesions are identified in both lobes, measuring in the 5-10 mm size range. There is no evidence for gallstones, gallbladder wall thickening, or pericholecystic fluid. No intrahepatic or  extrahepatic biliary dilation. Pancreas: No focal mass lesion. No dilatation of the main duct. No intraparenchymal cyst. No peripancreatic edema. Spleen: No splenomegaly. No focal mass lesion. Adrenals/Urinary Tract: No adrenal nodule or mass. Kidneys are unremarkable. No evidence for hydroureter. The urinary bladder appears normal for the degree of distention. Stomach/Bowel: Stomach is better distended on today's study than previous CT and PET studies. A bilobed lesion with apparent central necrosis is identified in the proximal aspect of the greater curvature gastric wall (image 11 series 4) measuring 5.2 x 1.9 cm. Duodenum is normally positioned as is the ligament of Treitz. No small bowel wall thickening. No small bowel dilatation. The terminal ileum is normal. The appendix is not visualized, but there is no edema or inflammation in the region of the cecum. No gross colonic mass. No colonic wall thickening. No substantial diverticular change. Vascular/Lymphatic: There is abdominal aortic atherosclerosis without aneurysm. 2.3 cm gastrohepatic ligament lymph nodes seen on the PET-CT now measures 3.2 cm. 2.5 cm short axis hepatoduodenal ligament lymph node is not substantially changed since prior PET-CT. 2.5 cm short axis portal caval lymph node was 1.8 cm on prior PET-CT. Retroperitoneal lymphadenopathy has progressed in the interval. 2.1 cm short axis aortocaval lymph node was 1.9 cm previously. 1.7 cm short axis left periaortic lymph nodes seen image 37 series 4 was only about 8 mm on the prior PET-CT. There is new the periaortic soft tissue stranding/ edema that tracks caudally into the pelvis extends along the iliac vasculature of both pelvic sidewalls. Presacral edema is also new in the interval. Reproductive: Prostate gland upper normal to mildly enlarged. Other: No intraperitoneal free fluid. Musculoskeletal: Bone windows reveal no worrisome lytic or sclerotic osseous lesions. IMPRESSION: 1. Better  distention of the stomach today shows a bilobed lesion or two immediately adjacent lesions of the greater curvature of the stomach, in the region the abnormal PET activity seen on PET-CT. 2. 1.6 cm nodule posterior left lung base concerning for metastatic disease, appearing new in the interval. 3. Multiple new hepatic metastases. 4. Interval progression of gastrohepatic, hepatoduodenal, and retroperitoneal necrotic lymphadenopathy consistent with metastatic disease. 5. Interval development of retroperitoneal edema around the aorta extending  down into both pelvic sidewalls, presumably secondary to the retroperitoneal lymphadenopathy. 6.  Abdominal Aortic Atherosclerois (ICD10-170.0) Electronically Signed   By: Misty Stanley M.D.   On: 11/30/2016 12:17   Ct Angio Chest Pe W/cm &/or Wo Cm  Result Date: 12/29/2016 CLINICAL DATA:  Chest pain, shortness of Breath EXAM: CT ANGIOGRAPHY CHEST WITH CONTRAST TECHNIQUE: Multidetector CT imaging of the chest was performed using the standard protocol during bolus administration of intravenous contrast. Multiplanar CT image reconstructions and MIPs were obtained to evaluate the vascular anatomy. CONTRAST:  75 cc Isovue 370 IV COMPARISON:  Chest x-ray earlier today. FINDINGS: Cardiovascular: There appears to be a filling defect in a right lower lobe pulmonary artery on image 76. However, this has a fluid fluid meniscus appearance and may represent posteriorly layering contrast. No other filling defects in the pulmonary arteries. Heart is enlarged. Mediastinum/Nodes: Enlarging posterior mediastinal lymph node adjacent to the distal esophagus on image 91, measuring up to 3.2 cm. Scattered borderline mediastinal lymph nodes elsewhere throughout the mediastinum. No axillary or hilar adenopathy. Lungs/Pleura: Large bilateral pleural effusions. Bilateral lower lobe airspace opacities could reflect pneumonia. Upper Abdomen: Numerous enlarged lymph nodes in the upper abdomen in the  region of the gastrohepatic ligament. There are low-density lesions scattered throughout the liver concerning for metastases. Musculoskeletal: Chest wall soft tissues are unremarkable. No acute bony abnormality. Review of the MIP images confirms the above findings. IMPRESSION: There is apparent filling defect in a right lower lobe pulmonary arterial branch, but this appears to be a fluid fluid level of posteriorly layering contrast not felt represent a pulmonary embolus. Cardiomegaly. Large bilateral pleural effusions. Bilateral lower lobe atelectasis or pneumonia. Extensive upper abdominal and posterior mediastinal adenopathy. These lymph nodes have enlarged since prior study. In addition, there are numerous low-density lesions now noted in the liver concerning for metastases. Electronically Signed   By: Rolm Baptise M.D.   On: 12/29/2016 11:31   Dg Chest Port 1 View  Result Date: 12/29/2016 CLINICAL DATA:  Chest pain for 3 days EXAM: PORTABLE CHEST 1 VIEW COMPARISON:  10/11/2016 FINDINGS: Cardiomegaly with bilateral interstitial and alveolar opacities in the mid and lower lungs. Possible small effusions. Findings likely reflect mild edema/ CHF. IMPRESSION: Mild CHF.  Possible small effusions. Electronically Signed   By: Rolm Baptise M.D.   On: 12/29/2016 10:24    echocardiogram  ECG: personally reviewed, SR, RBBB, Q in the inferior leads and anterior leads, unchanged from prior     ASSESSMENT AND PLAN  1. Chest pain, troponin 1.7 - the patient has evidence of acute combined systolic and diastolic CHF with large B/L pleural effusions, he is SOB at rest, but saturating 100% on [place] 2 L O2. There is possibly an underlying pneumonia, I will have primary team address pneumonia. - I will start aggressive diuresis, but he might require B/L thoracentesis, possibly tomorrow.   2. Troponin elevation - demand ischemia in the settings of sepsis and CHF, ECG unchanged from prior and no ST elevations, o  ischemic workup necessary  3. Aggressive gastric adenocarcinoma - the patient is not tolerating chemotherapy - no appetite, I will add marinol - ? Palliative care in the near future   4. CAD - on xarelto and plavix - hold xarelto if any procedure needed, start Lovenox in the morning, he took last dose of xarelto this am - continue carvedilol, atorvastatin   Signed, Ena Dawley, MD, [facility] 12/29/2016, 3:10 PM

## 2016-12-29 NOTE — ED Provider Notes (Signed)
[facility] DEPT Provider Note   CSN: [id number] Arrival date & time: 12/29/16  2585     History   Chief Complaint Chief Complaint  Patient presents with  . Chest Pain    HPI [name] is a 66 y.o. male.  HPI Patient has complex medical history of metastatic gastric cancer, multiple DVT and PE and coronary artery disease treated with stent all within the past 3 months. Patient had chemotherapy 7 days ago. At that time he is feeling well. His wife reports that they've walked in and walked out of chemotherapy and were making plans for A trip. As of 4 days ago he started to become very lethargic and somewhat confused. Patient also had started on morphine. This morning he began reporting chest pain and shortness of breath. He reports the chest pain is throughout his chest and he feels very short of breath. He reports he is to be ongoing. Patient's wife reports this is very unlike him. He is normally alert and interactive with a good sense of humor. She had attributed some of these changes over the past several days to the morphine. But he continued to appear more ill and then today complained of chest pain and they sought treatment. Past Medical History:  Diagnosis Date  . Anginal pain ([place])   . CAD in native artery    a. STEMI 09/2016 s/p DES to mLAD, residual 65-75% OM1 disease treated medically. EF 30-35% by cath, 35-40% by echo at dc.  . Chronic systolic CHF (congestive heart failure) ([place])    a. Dx 09/2016 - EF 30-35% by cath, 35-40% by echo.  . Depression   . Dilatation of aorta ([place])    a. CT 09/2016: dilation of the ascending thoracic aorta measuring 4.4 cm in maximum diameter, recommend f/u 2021 if appropriate.  [name] Kitchen DVT (deep venous thrombosis) ([place])    a. Bilat DVT noted 08/2016.  . Essential hypertension   . GERD (gastroesophageal reflux disease)   . 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 History of stroke    a. 09/2016.  [name] Kitchen Hypercoagulable state ([place])   .  Hyperlipidemia   . Hyponatremia   . Ischemic cardiomyopathy   . Lymphadenopathy    a. Abd lymphadenopathy concerning for malignancy noted in 09/2016.  [name] Kitchen Normocytic anemia   . PE (pulmonary thromboembolism) ([place])    a. Bilat PE 08/2016.  . Right leg weakness    secondary to stroke  . Sleep apnea   . Stroke ([place])   . Suspected sleep apnea     Patient Active Problem List   Diagnosis Date Noted  . Counseling regarding advanced care planning and goals of care 12/23/2016  . Liver metastases ([place]) 12/15/2016  . Gastric adenocarcinoma ([place]) 12/13/2016  . Gastric mass   . Abnormal CT scan, stomach 12/06/2016  . CVA (cerebral vascular accident) ([place]) 10/23/2016  . Obstructive sleep apnea 10/17/2016  . CAD S/P percutaneous coronary angioplasty 10/17/2016  . Lymphadenopathy 10/17/2016  . Nocturnal hypoxia 10/17/2016  . Acute systolic heart failure ([place]) 10/17/2016  . Ischemic cardiomyopathy 10/17/2016  . ST elevation myocardial infarction (STEMI) ([place]) 10/11/2016  . Hypercoagulable state ([place])   . 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 Lymphadenopathy, abdominal 10/03/2016  . History of CVA (cerebrovascular accident) 09/25/2016  . History of pulmonary embolus (PE) 09/10/2016  . History of deep vein thrombosis 09/10/2016  . Hyponatremia 09/10/2016  . Normocytic anemia 09/10/2016  . Acute pulmonary embolism ([place]) 09/10/2016  .  Essential hypertension 06/10/2015  . Depression 06/10/2015  . Hyperlipidemia LDL goal <130 06/10/2015  . GERD (gastroesophageal reflux disease) 06/10/2015    Past Surgical History:  Procedure Laterality Date  . adnoidectomy    . APPENDECTOMY    . CORONARY STENT INTERVENTION N/A 10/11/2016   Procedure: Coronary Stent Intervention;  Surgeon: Belva Crome, MD;  Location: [facility];  Service: Cardiovascular;  Laterality: N/A;  . ESOPHAGOGASTRODUODENOSCOPY N/A 12/09/2016   Procedure: ESOPHAGOGASTRODUODENOSCOPY (EGD);  Surgeon: Doran Stabler, MD;  Location: [facility];  Service: Gastroenterology;  Laterality: N/A;  . LEFT HEART CATH AND CORONARY ANGIOGRAPHY N/A 10/11/2016   Procedure: Left Heart Cath and Coronary Angiography;  Surgeon: Belva Crome, MD;  Location: [facility];  Service: Cardiovascular;  Laterality: N/A;  . PROSTATE SURGERY    . TONSILLECTOMY AND ADENOIDECTOMY         Home Medications    Prior to Admission medications   Medication Sig Start Date End Date Taking? Authorizing Provider  atorvastatin (LIPITOR) 80 MG tablet Take 1 tablet (80 mg total) by mouth every evening. 12/07/16   Dettinger, Fransisca Kaufmann, MD  capecitabine (XELODA) 500 MG tablet Take 4 tablets (2,000 mg total) by mouth 2 (two) times daily after a meal. Take for 14 days on, 7 days off, repeat every 21 days 12/20/16   Brunetta Genera, MD  carvedilol (COREG) 6.25 MG tablet Take 1 tablet (6.25 mg total) by mouth 2 (two) times daily. 12/15/16   Belva Crome, MD  clopidogrel (PLAVIX) 75 MG tablet Take 1 tablet (75 mg total) by mouth daily. 12/15/16   Belva Crome, MD  enoxaparin (LOVENOX) 100 MG/ML injection Inject 1 mL (100 mg total) into the skin every 12 (twelve) hours. 12/10/16   Doran Stabler, MD  escitalopram (LEXAPRO) 20 MG tablet Take 1 tablet (20 mg total) by mouth daily. 07/25/16   Dettinger, Fransisca Kaufmann, MD  morphine (MSIR) 15 MG tablet Take 1 tablet (15 mg total) by mouth every 4 (four) hours as needed for moderate pain or severe pain. 12/26/16   Brunetta Genera, MD  multivitamin-lutein [facility]) CAPS capsule Take 1 capsule by mouth daily.    [provider]  nitroGLYCERIN (NITROSTAT) 0.4 MG SL tablet Place 1 tablet (0.4 mg total) under the tongue every 5 (five) minutes as needed for chest pain (up to 3 doses). 10/17/16   Dunn, Nedra Hai, PA-C  ondansetron (ZOFRAN) 4 MG tablet Take 1 tablet (4 mg total) by mouth every 8 (eight) hours as needed for nausea. 12/15/16   Brunetta Genera, MD  pantoprazole (PROTONIX) 40 MG tablet Take 1  tablet (40 mg total) by mouth daily. 12/15/16   Belva Crome, MD  senna-docusate (SENNA S) 8.6-50 MG tablet Take 2 tablets by mouth at bedtime. 12/15/16   Brunetta Genera, MD    Family History Family History  Problem Relation Age of Onset  . COPD Mother   . Heart disease Mother   . AAA (abdominal aortic aneurysm) Mother   . Heart disease Father   . Depression Father   . Diabetes Father   . Heart disease Brother   . Early death Brother        heart attack  . Heart disease Maternal Grandmother   . Heart disease Maternal Grandfather   . Stroke Paternal Grandmother   . Heart disease Brother   . Diabetes Brother     Social History Social History  Substance Use Topics  . Smoking status: Never Smoker  . Smokeless tobacco: Never Used  . Alcohol use No     Allergies   Patient has no known allergies.   Review of Systems Review of Systems 10 Systems reviewed and are negative for acute change except as noted in the HPI.   Physical Exam Updated Vital Signs BP 109/65 (BP Location: Left Arm)   Pulse 91   Temp 98.1 F (36.7 C) (Oral)   Resp (!) 21   Wt 229 lb (103.9 kg)   SpO2 100%   BMI 30.21 kg/m   Physical Exam  Constitutional: He appears well-developed and well-nourished.  Patient is pale and ill in appearance. Slightly diaphoretic.  HENT:  Head: Normocephalic and atraumatic.  Mouth/Throat: Oropharynx is clear and moist.  Eyes: EOM are normal. Pupils are equal, round, and reactive to light.  Cardiovascular: Normal rate, regular rhythm, normal heart sounds and intact distal pulses.   Pulmonary/Chest: Effort normal and breath sounds normal.  Abdominal: Soft. He exhibits no distension. There is no tenderness. There is no guarding.  Musculoskeletal:  Right lower extremity is 2+ edema. Left lower extremity 1+.     ED Treatments / Results  Labs (all labs ordered are listed, but only abnormal results are displayed) Labs Reviewed  URINE CULTURE  CULTURE,  BLOOD (ROUTINE X 2)  CULTURE, BLOOD (ROUTINE X 2)  BASIC METABOLIC PANEL  CBC  PROTIME-INR  LIPASE, BLOOD  BRAIN NATRIURETIC PEPTIDE  URINALYSIS, ROUTINE W REFLEX MICROSCOPIC  HEPATIC FUNCTION PANEL  DIFFERENTIAL  I-STAT TROPOININ, ED  I-STAT TROPOININ, ED  I-STAT CHEM 8, ED  I-STAT CG4 LACTIC ACID, ED  TYPE AND SCREEN    EKG  EKG Interpretation None       Radiology Dg Chest Port 1 View  Result Date: 12/29/2016 CLINICAL DATA:  Chest pain for 3 days EXAM: PORTABLE CHEST 1 VIEW COMPARISON:  10/11/2016 FINDINGS: Cardiomegaly with bilateral interstitial and alveolar opacities in the mid and lower lungs. Possible small effusions. Findings likely reflect mild edema/ CHF. IMPRESSION: Mild CHF.  Possible small effusions. Electronically Signed   By: Rolm Baptise M.D.   On: 12/29/2016 10:24    Procedures Procedures (including critical care time) CRITICAL CARE Performed by: Charlesetta Shanks   Total critical care time: 30  minutes  Critical care time was exclusive of separately billable procedures and treating other patients.  Critical care was necessary to treat or prevent imminent or life-threatening deterioration.  Critical care was time spent personally by me on the following activities: development of treatment plan with patient and/or surrogate as well as nursing, discussions with consultants, evaluation of patient's response to treatment, examination of patient, obtaining history from patient or surrogate, ordering and performing treatments and interventions, ordering and review of laboratory studies, ordering and review of radiographic studies, pulse oximetry and re-evaluation of patient's condition. Medications Ordered in ED Medications  sodium chloride 0.9 % bolus 500 mL (not administered)  iopamidol (ISOVUE-370) 76 % injection (not administered)     Initial Impression / Assessment and Plan / ED Course  I have reviewed the triage vital signs and the nursing  notes.  Pertinent labs & imaging results that were available during my care of the patient were reviewed by me and considered in my medical decision making (see chart for details).     Consult cardiology: (10:33) aware of inferior EKG changes will continue to review diagnostic results and return call. (10:54) DR. Meda Coffee cardiology proceed with PE study. Evaluate  for Cath. Consult: Dr. Doyle Askew will admit for [facility] hospitalist with cardiology consultation (11:07) Dr. Meda Coffee reports will not cath emergently but will continue to monitor and trend enzymes. Final Clinical Impressions(s) / ED Diagnoses   Final diagnoses:  Metastatic gastric cancer Chest pain B/L pleural effusions  NSTEMI Severe comorbid illness   Patient presents severely ill in appearance with chest pain and dyspnea. Severe comorbid illness of metastatic gastric cancer with complications of DVT, PE and recent coronary artery stents. Patient is regularly anticoagulated on Xarelto daily. CT does not show acute PE but significant bilateral pleural effusions. Troponin positive for NSTEMI discussed EKG with cardiology and they felt inferior elevations were older and more chronic. Patient will not go directly to emergent catheterization. [facility] and medical management of multiple comorbid illnesses New Prescriptions New Prescriptions   No medications on file     Charlesetta Shanks, MD 12/30/16 [phone]

## 2016-12-29 NOTE — Progress Notes (Signed)
ANTICOAGULATION CONSULT NOTE - Initial Consult  Pharmacy Consult for IV heparin Indication: chest pain/ACS  No Known Allergies  Patient Measurements: Weight: 229 lb (103.9 kg) Heparin Dosing Weight: 101 kg  Vital Signs: Temp: 98.1 F (36.7 C) (05/10 0947) Temp Source: Oral (05/10 0947) BP: 104/74 (05/10 1130) Pulse Rate: 91 (05/10 0947)  Labs:  Recent Labs  12/29/16 1006 12/29/16 1042 12/29/16 1048  HGB 11.3*  --  11.9*  HCT 34.4*  --  35.0*  PLT 215  --   --   LABPROT 28.8*  --   --   INR 2.65  --   --   CREATININE 1.10  --  1.10  TROPONINI  --  1.70*  --     Estimated Creatinine Clearance: 82.5 mL/min (by C-G formula based on SCr of 1.1 mg/dL).   Medical History: Past Medical History:  Diagnosis Date  . Anginal pain ([place])   . CAD in native artery    a. STEMI 09/2016 s/p DES to mLAD, residual 65-75% OM1 disease treated medically. EF 30-35% by cath, 35-40% by echo at dc.  . Chronic systolic CHF (congestive heart failure) ([place])    a. Dx 09/2016 - EF 30-35% by cath, 35-40% by echo.  . Depression   . Dilatation of aorta ([place])    a. CT 09/2016: dilation of the ascending thoracic aorta measuring 4.4 cm in maximum diameter, recommend f/u 2021 if appropriate.  [name] Kitchen DVT (deep venous thrombosis) ([place])    a. Bilat DVT noted 08/2016.  . Essential hypertension   . GERD (gastroesophageal reflux disease)   . 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 History of stroke    a. 09/2016.  [name] Kitchen Hypercoagulable state ([place])   . Hyperlipidemia   . Hyponatremia   . Ischemic cardiomyopathy   . Lymphadenopathy    a. Abd lymphadenopathy concerning for malignancy noted in 09/2016.  [name] Kitchen Normocytic anemia   . PE (pulmonary thromboembolism) ([place])    a. Bilat PE 08/2016.  . Right leg weakness    secondary to stroke  . Sleep apnea   . Stroke ([place])   . Suspected sleep apnea     Medications:  Scheduled:  Infusions:   Assessment: 66 yo male presents to ER with dyspnea and CP to start IV  heparin per Rx for possible ACS. Note patient with metastatic gastric cancer and has had multiple VTEs currently on Xarelto. Per med hx, to complete 21 days of 15mg  BID dosing today and was to start 20mg  once daily tomorrow. Baseline CBC drawn. Will need baseline heparin level and aPTT prior to starting IV heparin. Last dose of Xarelto 15mg  was at 0800 this AM 5/10. Per protocol, to start IV heparin when next dose of Xarelto would have been due at 1700 this PM.   Goal of Therapy:  Heparin level 0.3-0.7 units/ml aPTT 66-102 seconds Monitor platelets by anticoagulation protocol: Yes   Plan:  1) No IV heparin bolus 2) Start IV heparin 1400 units/hr at 1700 this PM 3) Check heparin level and aPTT before start of IV heparin 4) Daily heparin level, aPTT, and CBC   Adrian Saran, PharmD, BCPS Pager [phone] 12/29/2016 12:32 PM

## 2016-12-29 NOTE — ED Notes (Signed)
Patient transported to CT 

## 2016-12-29 NOTE — ED Notes (Signed)
Bed: WA17 Expected date:  Expected time:  Means of arrival:  Comments: Hold triage

## 2016-12-29 NOTE — H&P (Addendum)
History and Physical    [name] ERD:[id number] DOB: May 23, 1951 DOA: 12/29/2016  Referring MD/NP/PA: Dr. Johnney Killian   PCP: Dettinger, Fransisca Kaufmann, MD   Patient coming from: home   Chief Complaint: dyspnea and chest pain   HPI: [name] is a 66 y.o. male with known metastatic gastric cancer, follows with Dr. [name], last chemotherapy 7 days prior to this admission,  multiple bilateral lower extremity DVT and bilateral large volume PE 09/10/2016 on Xarelto, extensive embolic CVAs, CAD, status post acute STEMI status post stenting 10/11/2016 (DES stenting of his mid LAD with a Synergy stent), abdominal lymphadenopathy that required PET scan 10/10/2016 for further evaluation and was notable for hypermetabolic gastrohepatic ligament LN, gastrosplenic LN, porta hepatis LN, consistent with metastatic lymph nodes.  Patient presented to [facility] emergency department with main concern of almost 7 days duration of progressively worsening weakness, fatigue, mostly exertional dyspnea and intermittent episodes of chest discomfort. Patient explains that he has noted mid area chest discomfort getting worse over the past several days, pressure-like, occasionally but not consistently radiating to bilateral arms, associated with dyspnea, lethargy, confusion, poor oral intake. Patient reports movement makes things worse and pain improves with rest. Patient denies fevers and chills, no specific urinary concerns. Patient reports he had 2 episodes of nonbloody vomiting yesterday. Per wife, he has not been eating anything for over 3 days.  ED Course: In emergency department, patient appears calm, currently not in acute distress, vital signs stable, blood work notable for sodium 128, BUN 33, lactic acid 2.29, first troponin 2.29, 12-lead EKG with ?ST elevation in lateral leads.TRH asked to admit pt to SDU and cardiology was consulted and recommended transfer to [facility].   Review of Systems:  Constitutional:  Positive for diaphoresis, activity change, appetite change and fatigue.  HENT: Negative for ear pain, nosebleeds, congestion, facial swelling, rhinorrhea, neck pain, neck stiffness and ear discharge.   Eyes: Negative for pain, discharge, redness, itching and visual disturbance.  Respiratory: Negative for cough, wheezing and stridor.   Cardiovascular: Positive for chest pain, palpitations and leg swelling.  Gastrointestinal: Negative for abdominal distention.  Genitourinary: Negative for dysuria, urgency, frequency, hematuria, flank pain, decreased urine volume Musculoskeletal: Negative for back pain, joint swelling, arthralgias Neurological: Negative for dizziness, tremors, seizures, syncope, facial asymmetry, speech difficulty Hematological: Negative for adenopathy.  Psychiatric/Behavioral: Negative for hallucinations, behavioral problems, confusion, dysphoric mood, decreased concentration and agitation.   Past Medical History:  Diagnosis Date  . Anginal pain ([place])   . CAD in native artery    a. STEMI 09/2016 s/p DES to mLAD, residual 65-75% OM1 disease treated medically. EF 30-35% by cath, 35-40% by echo at dc.  . Chronic systolic CHF (congestive heart failure) ([place])    a. Dx 09/2016 - EF 30-35% by cath, 35-40% by echo.  . Depression   . Dilatation of aorta ([place])    a. CT 09/2016: dilation of the ascending thoracic aorta measuring 4.4 cm in maximum diameter, recommend f/u 2021 if appropriate.  [name] Kitchen DVT (deep venous thrombosis) ([place])    a. Bilat DVT noted 08/2016.  . Essential hypertension   . GERD (gastroesophageal reflux disease)   . 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 History of stroke    a. 09/2016.  [name] Kitchen Hypercoagulable state ([place])   . Hyperlipidemia   . Hyponatremia   . Ischemic cardiomyopathy   . Lymphadenopathy    a. Abd lymphadenopathy concerning for malignancy noted in 09/2016.  [name] Kitchen  Normocytic anemia   . PE (pulmonary thromboembolism) ([place])    a. Bilat PE 08/2016.  . Right leg  weakness    secondary to stroke  . Sleep apnea   . Stroke ([place])   . Suspected sleep apnea     Past Surgical History:  Procedure Laterality Date  . adnoidectomy    . APPENDECTOMY    . CORONARY STENT INTERVENTION N/A 10/11/2016   Procedure: Coronary Stent Intervention;  Surgeon: Belva Crome, MD;  Location: [place] [facility];  Service: Cardiovascular;  Laterality: N/A;  . ESOPHAGOGASTRODUODENOSCOPY N/A 12/09/2016   Procedure: ESOPHAGOGASTRODUODENOSCOPY (EGD);  Surgeon: Doran Stabler, MD;  Location: [place];  Service: Gastroenterology;  Laterality: N/A;  . LEFT HEART CATH AND CORONARY ANGIOGRAPHY N/A 10/11/2016   Procedure: Left Heart Cath and Coronary Angiography;  Surgeon: Belva Crome, MD;  Location: [place] [facility];  Service: Cardiovascular;  Laterality: N/A;  . PROSTATE SURGERY    . TONSILLECTOMY AND ADENOIDECTOMY     Social Hx:  reports that he has never smoked. He has never used smokeless tobacco. He reports that he does not drink alcohol or use drugs.  No Known Allergies  Family History  Problem Relation Age of Onset  . COPD Mother   . Heart disease Mother   . AAA (abdominal aortic aneurysm) Mother   . Heart disease Father   . Depression Father   . Diabetes Father   . Heart disease Brother   . Early death Brother        heart attack  . Heart disease Maternal Grandmother   . Heart disease Maternal Grandfather   . Stroke Paternal Grandmother   . Heart disease Brother   . Diabetes Brother     Prior to Admission medications   Medication Sig Start Date End Date Taking? Authorizing Provider  atorvastatin (LIPITOR) 80 MG tablet Take 1 tablet (80 mg total) by mouth every evening. 12/07/16  Yes Dettinger, Fransisca Kaufmann, MD  capecitabine (XELODA) 500 MG tablet Take 4 tablets (2,000 mg total) by mouth 2 (two) times daily after a meal. Take for 14 days on, 7 days off, repeat every 21 days 12/20/16  Yes Brunetta Genera, MD  carvedilol (COREG) 6.25 MG tablet Take 1  tablet (6.25 mg total) by mouth 2 (two) times daily. 12/15/16  Yes Belva Crome, MD  clopidogrel (PLAVIX) 75 MG tablet Take 1 tablet (75 mg total) by mouth daily. 12/15/16  Yes Belva Crome, MD  escitalopram (LEXAPRO) 20 MG tablet Take 1 tablet (20 mg total) by mouth daily. 07/25/16  Yes Dettinger, Fransisca Kaufmann, MD  morphine (MSIR) 15 MG tablet Take 1 tablet (15 mg total) by mouth every 4 (four) hours as needed for moderate pain or severe pain. 12/26/16  Yes Brunetta Genera, MD  nitroGLYCERIN (NITROSTAT) 0.4 MG SL tablet Place 1 tablet (0.4 mg total) under the tongue every 5 (five) minutes as needed for chest pain (up to 3 doses). 10/17/16  Yes Dunn, Dayna N, PA-C  omeprazole (PRILOSEC) 40 MG capsule Take 40 mg by mouth at bedtime.   Yes [provider]  ondansetron (ZOFRAN) 4 MG tablet Take 1 tablet (4 mg total) by mouth every 8 (eight) hours as needed for nausea. 12/15/16  Yes Brunetta Genera, MD  Rivaroxaban (XARELTO STARTER PACK) 15 & 20 MG TBPK Take 15 mg by mouth 2 (two) times daily. Take as directed on package: Start with one 15mg  tablet by mouth twice a  day with food. On Day 22, switch to one 20mg  tablet once a day with food.   Yes [provider]  senna-docusate (SENNA S) 8.6-50 MG tablet Take 2 tablets by mouth at bedtime. 12/15/16  Yes Brunetta Genera, MD  traMADol (ULTRAM) 50 MG tablet Take 50 mg by mouth every 6 (six) hours as needed for moderate pain or severe pain.   Yes [provider]  enoxaparin (LOVENOX) 100 MG/ML injection Inject 1 mL (100 mg total) into the skin every 12 (twelve) hours. Patient not taking: Reported on 12/29/2016 12/10/16   Doran Stabler, MD  pantoprazole (PROTONIX) 40 MG tablet Take 1 tablet (40 mg total) by mouth daily. Patient not taking: Reported on 12/29/2016 12/15/16   Belva Crome, MD    Physical Exam: Vitals:   12/29/16 0947 12/29/16 0949 12/29/16 1052 12/29/16 1101  BP: 109/65  95/72   Pulse: 91     Resp: (!) 21   11 17   Temp: 98.1 F (36.7 C)     TempSrc: Oral     SpO2: 100%  99% 99%  Weight:  103.9 kg (229 lb)      Constitutional: NAD, calm, Pale Vitals:   12/29/16 0947 12/29/16 0949 12/29/16 1052 12/29/16 1101  BP: 109/65  95/72   Pulse: 91     Resp: (!) 21  11 17   Temp: 98.1 F (36.7 C)     TempSrc: Oral     SpO2: 100%  99% 99%  Weight:  103.9 kg (229 lb)     Eyes: PERRL ENMT: Mucous membranes are dry. Posterior pharynx clear of any exudate or lesions.Normal dentition.  Neck: normal, supple, no masses, no thyromegaly Respiratory:  Normal respiratory effort. No accessory muscle use.  Cardiovascular: Regular rate and rhythm, no murmurs / rubs / gallops. Right lower extremity edema +2, left lower extremity edema +1 Abdomen: no tenderness, no masses palpated. No hepatosplenomegaly. Bowel sounds positive.  Musculoskeletal: no clubbing / cyanosis. No joint deformity upper and lower extremities.  Skin: no rashes, lesions, ulcers. No induration Neurologic: CN 2-12 grossly intact. Sensation intact, DTR normal Psychiatric: Normal judgment and insight. Alert and oriented x 3. Normal mood.   Labs on Admission: I have personally reviewed following labs and imaging studies  CBC:  Recent Labs Lab 12/22/16 1213 12/29/16 1006 12/29/16 1048  WBC 6.2 8.8  --   NEUTROABS 4.1 6.3  --   HGB 11.3* 11.3* 11.9*  HCT 34.4* 34.4* 35.0*  MCV 82.2 81.9  --   PLT 201 215  --    Basic Metabolic Panel:  Recent Labs Lab 12/22/16 1213 12/29/16 1006 12/29/16 1048  NA 132* 128* 128*  K 4.3 4.8 4.7  CL  --  94* 95*  CO2 26 22  --   GLUCOSE 117 155* 145*  BUN 14.9 33* 30*  CREATININE 0.8 1.10 1.10  CALCIUM 9.6 9.4  --    Liver Function Tests:  Recent Labs Lab 12/22/16 1213 12/29/16 1006  AST 38* 72*  ALT 30 68*  ALKPHOS 144 126  BILITOT 0.76 1.1  PROT 6.5 6.6  ALBUMIN 3.3* 3.6    Recent Labs Lab 12/29/16 1006  LIPASE 22   Coagulation Profile:  Recent Labs Lab 12/29/16 1006   INR 2.65   Urine analysis:    Component Value Date/Time   COLORURINE YELLOW 09/27/2016 0035   APPEARANCEUR CLEAR 09/27/2016 0035   LABSPEC 1.019 09/27/2016 0035   PHURINE 5.0 09/27/2016 0035   GLUCOSEU  NEGATIVE 09/27/2016 0035   HGBUR MODERATE (A) 09/27/2016 0035   BILIRUBINUR NEGATIVE 09/27/2016 0035   KETONESUR NEGATIVE 09/27/2016 0035   PROTEINUR NEGATIVE 09/27/2016 0035   NITRITE NEGATIVE 09/27/2016 0035   LEUKOCYTESUR NEGATIVE 09/27/2016 0035   Radiological Exams on Admission: Ct Angio Chest Pe W/cm &/or Wo Cm Result Date: 12/29/2016 There is apparent filling defect in a right lower lobe pulmonary arterial branch, but this appears to be a fluid fluid level of posteriorly layering contrast not felt represent a pulmonary embolus. Cardiomegaly. Large bilateral pleural effusions. Bilateral lower lobe atelectasis or pneumonia. Extensive upper abdominal and posterior mediastinal adenopathy. These lymph nodes have enlarged since prior study. In addition, there are numerous low-density lesions now noted in the liver concerning for metastases.  Dg Chest Port 1 View Result Date: 12/29/2016 Mild CHF.  Possible small effusions.   EKG: NSR, ? ST elevation in inf, lat leads   Assessment/Plan Active Problems: Chest pain, dyspnea, elevated troponin, ? STEMI, recent anterior wall STEMI, status post LAD PCI with DES on 10/11/2016 - cardiology consulted, plan to transfer to [facility] per cardiology team request - Holding Xarelto, Heparin started instead  - placed on analgesia as needed, Zofran as needed, NTG - pt already on statin and coreg, Plavix  Chronic combined systolic and diastolic CHF - Echo 56/86/1683 and 10/17/2016 with EF 72-90%, grade 2 diastolic CHF - Monitor daily weights, strict I/O  Metastatic, poorly differentiated gastric adenocarcinoma - With multiple liver metastases, likely lung metastases, extensive abdominal adenopathy and stomach mass - Per recent PET scan 10/10/2016  lymph node involvement consistent with progressive metastatic illness - It was determined the patient is not a surgical candidate - Will notify Dr. [name] of patient's admission - Consider palliative care consultation  Extensive bilateral DVT, PE 09/10/2016 - In patient with likely hypercoagulable state likely related to metastatic differentiated gastric adenocarcinoma - On Xarelto, will hold for now and place on heparin drip - Please note that recent antiphospholipid antibody, negative workup  Hyponatremia - Likely prerenal, advance diet as patient able to tolerate - BMP in the morning  Transaminitis - Likely from liver metastases  DVT prophylaxis: on Xarelto, changed to heparin drip Code Status: Full Family Communication: Pt and wife updated at bedside Disposition Plan: transfer to [place] called: Cardiology  Admission status: Inpatient   Faye Ramsay MD [facility] Pager [phone]  If 7PM-7AM, please contact night-coverage [email or web address] Password [email or web address]  12/29/2016, 11:36 AM

## 2016-12-29 NOTE — ED Triage Notes (Signed)
Pt with chest pain x 3 days. Pt states pain is 5/10 in R and L chest, radiating to the back. Pt with extensive cardiac history and hx of cancer, had chemotherapy 7 days ago and has ongoing nausea, shortness of breath at rest and feeling weak.

## 2016-12-29 NOTE — Progress Notes (Signed)
ANTICOAGULATION CONSULT NOTE - Follow Up Consult  Pharmacy Consult for Heparin (Xarelto on hold) Indication: chest pain/ACS and history of VTE  No Known Allergies  Patient Measurements: Height: 6\' 1"  (185.4 cm) Weight: 230 lb 9.6 oz (104.6 kg) IBW/kg (Calculated) : 79.9  Vital Signs: Temp: 97.7 F (36.5 C) (05/10 1902) Temp Source: Oral (05/10 1902) BP: 94/66 (05/10 1902) Pulse Rate: 88 (05/10 1902)  Labs:  Recent Labs  12/29/16 1006 12/29/16 1042 12/29/16 1048 12/29/16 1428  HGB 11.3*  --  11.9*  --   HCT 34.4*  --  35.0*  --   PLT 215  --   --   --   APTT  --   --   --  45*  LABPROT 28.8*  --   --   --   INR 2.65  --   --   --   HEPARINUNFRC  --   --   --  >2.20*  CREATININE 1.10  --  1.10  --   TROPONINI  --  1.70*  --   --     Estimated Creatinine Clearance: 82.8 mL/min (by C-G formula based on SCr of 1.1 mg/dL).   Assessment: 66 yo male presents to ER with dyspnea and CP to start IV heparin per Rx for possible ACS. Note patient with metastatic gastric cancer and has had multiple VTEs currently on Xarelto. APTT is therapeutic at 81 tonight. Using aPTT to dose heparin for now given Xarelto influence on anti-Xa levels.   Goal of Therapy:  Heparin level 0.3-0.7 units/ml aPTT 66-102 seconds Monitor platelets by anticoagulation protocol: Yes   Plan:  -Cont heparin 1400 units/hr -AM aPTT/HL to confirm  Narda Bonds 12/29/2016,11:29 PM

## 2016-12-30 ENCOUNTER — Inpatient Hospital Stay (HOSPITAL_COMMUNITY): Payer: Medicare Other

## 2016-12-30 ENCOUNTER — Encounter (HOSPITAL_COMMUNITY): Payer: Medicare Other

## 2016-12-30 ENCOUNTER — Encounter (HOSPITAL_COMMUNITY): Payer: Self-pay | Admitting: Physician Assistant

## 2016-12-30 DIAGNOSIS — I5043 Acute on chronic combined systolic (congestive) and diastolic (congestive) heart failure: Secondary | ICD-10-CM

## 2016-12-30 DIAGNOSIS — R0602 Shortness of breath: Secondary | ICD-10-CM

## 2016-12-30 DIAGNOSIS — N171 Acute kidney failure with acute cortical necrosis: Secondary | ICD-10-CM

## 2016-12-30 DIAGNOSIS — C169 Malignant neoplasm of stomach, unspecified: Secondary | ICD-10-CM

## 2016-12-30 DIAGNOSIS — I361 Nonrheumatic tricuspid (valve) insufficiency: Secondary | ICD-10-CM

## 2016-12-30 HISTORY — PX: IR THORACENTESIS ASP PLEURAL SPACE W/IMG GUIDE: IMG5380

## 2016-12-30 LAB — TROPONIN I
TROPONIN I: 1.43 ng/mL — AB (ref <0.03)
TROPONIN I: 1.44 ng/mL — AB (ref <0.03)
TROPONIN I: 1.01 ng/mL — AB (ref <0.03)

## 2016-12-30 LAB — BASIC METABOLIC PANEL
Anion gap: 9 (ref 5–15)
BUN: 32 mg/dL — ABNORMAL HIGH (ref 6–20)
CHLORIDE: 95 mmol/L — AB (ref 101–111)
CO2: 24 mmol/L (ref 22–32)
Calcium: 9.1 mg/dL (ref 8.9–10.3)
Creatinine, Ser: 1.35 mg/dL — ABNORMAL HIGH (ref 0.61–1.24)
GFR, EST NON AFRICAN AMERICAN: 53 mL/min — AB (ref >60)
Glucose, Bld: 138 mg/dL — ABNORMAL HIGH (ref 65–99)
Potassium: 4.7 mmol/L (ref 3.5–5.1)
SODIUM: 128 mmol/L — AB (ref 135–145)

## 2016-12-30 LAB — HEPARIN LEVEL (UNFRACTIONATED): HEPARIN UNFRACTIONATED: 1.4 [IU]/mL — AB (ref 0.30–0.70)

## 2016-12-30 LAB — PROTEIN, PLEURAL OR PERITONEAL FLUID

## 2016-12-30 LAB — LACTATE DEHYDROGENASE, PLEURAL OR PERITONEAL FLUID: LD FL: 58 U/L — AB (ref 3–23)

## 2016-12-30 LAB — BODY FLUID CELL COUNT WITH DIFFERENTIAL
EOS FL: 0 %
LYMPHS FL: 8 %
MONOCYTE-MACROPHAGE-SEROUS FLUID: 89 % (ref 50–90)
Neutrophil Count, Fluid: 3 % (ref 0–25)
WBC FLUID: 292 uL (ref 0–1000)

## 2016-12-30 LAB — ECHOCARDIOGRAM COMPLETE
Ao-asc: 38 cm
CHL CUP MV M VEL: 85.6
CHL CUP TV REG PEAK VELOCITY: 331 cm/s
E/e' ratio: 15.62
EWDT: 204 ms
FS: 16 % — AB (ref 28–44)
HEIGHTINCHES: 73 in
IV/PV OW: 1.12
LA ID, A-P, ES: 44 mm
LA diam index: 1.92 cm/m2
LA vol A4C: 80.7 ml
LA vol: 74.4 mL
LAVOLIN: 32.5 mL/m2
LEFT ATRIUM END SYS DIAM: 44 mm
LV E/e' medial: 15.62
LV E/e'average: 15.62
LVELAT: 6.66 cm/s
LVOT MV VTI INDEX: 1.09 cm2/m2
LVOT MV VTI: 2.49
LVOT SV: 88 mL
LVOT VTI: 19.5 cm
LVOT area: 4.52 cm2
LVOT peak vel: 100 cm/s
LVOTD: 24 mm
MV Annulus VTI: 35.4 cm
MV Dec: 204
MV Peak grad: 4 mmHg
MV VTI: 126 cm
MV pk E vel: 104 m/s
MVAP: 2.44 cm2
MVG: 4 mmHg
MVSPHT: 90 ms
P 1/2 time: 326 ms
PW: 9 mm — AB (ref 0.6–1.1)
RV LATERAL S' VELOCITY: 9.28 cm/s
TAPSE: 18.9 mm
TDI e' lateral: 6.66
TDI e' medial: 3.23
TRMAXVEL: 331 cm/s
WEIGHTICAEL: 3689.62 [oz_av]

## 2016-12-30 LAB — CBC
HEMATOCRIT: 34.8 % — AB (ref 39.0–52.0)
Hemoglobin: 11.1 g/dL — ABNORMAL LOW (ref 13.0–17.0)
MCH: 26.5 pg (ref 26.0–34.0)
MCHC: 31.9 g/dL (ref 30.0–36.0)
MCV: 83.1 fL (ref 78.0–100.0)
PLATELETS: 220 10*3/uL (ref 150–400)
RBC: 4.19 MIL/uL — ABNORMAL LOW (ref 4.22–5.81)
RDW: 14.5 % (ref 11.5–15.5)
WBC: 9.5 10*3/uL (ref 4.0–10.5)

## 2016-12-30 LAB — GLUCOSE, PLEURAL OR PERITONEAL FLUID: GLUCOSE FL: 131 mg/dL

## 2016-12-30 LAB — APTT: aPTT: 93 seconds — ABNORMAL HIGH (ref 24–36)

## 2016-12-30 LAB — GRAM STAIN

## 2016-12-30 LAB — OSMOLALITY: Osmolality: 282 mOsm/kg (ref 275–295)

## 2016-12-30 MED ORDER — MIRTAZAPINE 15 MG PO TBDP
15.0000 mg | ORAL_TABLET | Freq: Every day | ORAL | Status: DC
  Administered 2016-12-30 – 2017-01-02 (×4): 15 mg via ORAL
  Filled 2016-12-30 (×4): qty 1

## 2016-12-30 MED ORDER — ACETAMINOPHEN 500 MG PO TABS
1000.0000 mg | ORAL_TABLET | Freq: Three times a day (TID) | ORAL | Status: DC
  Administered 2016-12-30 – 2017-01-03 (×11): 1000 mg via ORAL
  Filled 2016-12-30 (×12): qty 2

## 2016-12-30 MED ORDER — DEXAMETHASONE SODIUM PHOSPHATE 4 MG/ML IJ SOLN
8.0000 mg | Freq: Once | INTRAMUSCULAR | Status: AC
  Administered 2016-12-30: 8 mg via INTRAVENOUS
  Filled 2016-12-30: qty 2

## 2016-12-30 MED ORDER — LIDOCAINE HCL 1 % IJ SOLN
INTRAMUSCULAR | Status: DC | PRN
  Administered 2016-12-30: 10 mL

## 2016-12-30 MED ORDER — DEXAMETHASONE 4 MG PO TABS
4.0000 mg | ORAL_TABLET | Freq: Every day | ORAL | Status: DC
  Administered 2016-12-31 – 2017-01-03 (×4): 4 mg via ORAL
  Filled 2016-12-30 (×4): qty 1

## 2016-12-30 MED ORDER — LIDOCAINE HCL 1 % IJ SOLN
INTRAMUSCULAR | Status: AC
  Filled 2016-12-30: qty 20

## 2016-12-30 NOTE — Consult Note (Signed)
Palliative Consult Requested by: [facility], Cardiology  66 yo man from [place], [place] recently diagnosed with aggressive adenocarcinoma of the stomach and prior MI with severe CHF and strokes from malignancy hypercoagulable state-admitted with respiratory failure and CHF exacerbation.  He was told by oncology one month ago that his prognosis was at best 3 months. They opted for a trial of chemotherapy -he had one tx 7 days ago, and according to his wife since that time it has been a downhill slide. He is profoundly weak, short of breath and has no appetite. They have a cruise planned for May 27-they are desperately holding out for this special trip and are focused on the goal of getting him well enough to make it. While I am not sure if this is possible I have committed to at least trying-otherwise we will need to take this one day at a time.  Recommendations: 1. DNR, no heroics, no CPR or intubation- he DOES desire treatment of reversible disease and illness if there is a possibility he can improve his energy and functional status.  2. DC marinol, instead would like to use decadron-it will likely improve his energy, appetite and pain.  3. Continue anticoagulation especially while on steroids  4. Maximize heart failure regimen and diuresis   5. Proceed with thoracentesis, this may provide relief from dyspnea  6. No additional chemotherapy  7. He is opioid and benzodiazapine naive -wife reports confusion and sedation on Morphine.Will need to use very low doses initially. Will start him on scheduled Tylenol TID, tramadol (wife says it helps).  8. For mood and energy will use mirtazapine- may also help with nausea and appetite.  9. Aggressive bowel regimen.   Anticipatory care planning: We discussed Hospice. They are open to their assistance-as long as his QOL goals are considered (the cruise).  Time: 50 minutes Greater than 50%  of this time was spent counseling and coordinating care related to the  above assessment and plan.  Lane Hacker, DO Palliative Medicine [phone]

## 2016-12-30 NOTE — Procedures (Signed)
PROCEDURE SUMMARY:  Successful US guided right thoracentesis. Yielded 1.2 liters of clear orange fluid. Pt tolerated procedure well. No immediate complications.  Specimen was sent for labs. CXR ordered.  Abigail Butts S Eliora Nienhuis PA-C 12/30/2016 3:53 PM

## 2016-12-30 NOTE — Progress Notes (Signed)
[facility] Progress Note  Patient: [name] SWF:[id number]   PCP: Dettinger, Fransisca Kaufmann, MD DOB: April 20, 1951   DOA: 12/29/2016   DOS: 12/30/2016   Date of Service: the patient was seen and examined on 12/30/2016  Subjective: Shortness of breath, no chest pain, no nausea no vomiting. Somewhat confused.  Brief hospital course: Pt. with PMH of metastatic gastric cancer, recent chemotherapy initiation, extensive DVT, recent CVA, recent CAD; admitted on 12/29/2016, presented with complaint of fatigue tiredness, poor appetite and chest pain, was found to have NSTEMI, acute on chronic systolic CHF. Currently further plan is symptom control.  Assessment and Plan: Chest pain, dyspnea, elevated troponin, ? STEMI, recent anterior wall STEMI, status post LAD PCI with DES on 10/11/2016 - cardiology consulted, requesting thoracentesis, ordered. Cardiology considering Primacor but currently on hold. - Holding Xarelto, Heparin started instead  - placed on analgesia as needed, Zofran as needed, NTG - pt already on statin and coreg, Plavix  Chronic combined systolic and diastolic CHF - Echo 22/09/5425 and 10/17/2016 with EF 06-23%, grade 2 diastolic CHF - Monitor daily weights, strict I/O  Metastatic, poorly differentiated gastric adenocarcinoma - With multiple liver metastases, likely lung metastases, extensive abdominal adenopathy and stomach mass - Per recent PET scan 10/10/2016 lymph node involvement consistent with progressive metastatic illness - It was determined the patient is not a surgical candidate I have notified patient's primary oncologist. Palliative care consulted, primary focus is symptom control and treat what is treatable with reasonable degree.  Extensive bilateral DVT, PE 09/10/2016 - In patient with likely hypercoagulable state likely related to metastatic differentiated gastric adenocarcinoma - On Xarelto, will hold for now and place on heparin drip - Please note that  recent antiphospholipid antibody, negative workup  Hyponatremia - Likely prerenal, advance diet as patient able to tolerate - BMP in the morning  Transaminitis - Likely from liver metastases  Diet: cardiac det DVT Prophylaxis: Heparin  Advance goals of care discussion: DNR DNI  Family Communication: family was present at bedside, at the time of interview. The pt provided permission to discuss medical plan with the family. Opportunity was given to ask question and all questions were answered satisfactorily.   Disposition:  Discharge to home.  Consultants: Cardiology, palliative care Procedures: Echocardiogram   Antibiotics: Anti-infectives    None       Objective: Physical Exam: Vitals:   12/29/16 1346 12/29/16 1902 12/30/16 0500 12/30/16 1446  BP: 113/67 94/66 98/63  98/60  Pulse: 90 88 85 82  Resp: (!) 22 (!) 24 (!) 24 (!) 22  Temp: 97.5 F (36.4 C) 97.7 F (36.5 C) 97.9 F (36.6 C) 97.7 F (36.5 C)  TempSrc: Oral Oral Axillary Oral  SpO2: 99% 99% 98% 99%  Weight: 104.6 kg (230 lb 9.6 oz)     Height: 6\' 1"  (1.854 m)       Intake/Output Summary (Last 24 hours) at 12/30/16 1821 Last data filed at 12/30/16 0405  Gross per 24 hour  Intake           321.17 ml  Output              625 ml  Net          -303.83 ml   Filed Weights   12/29/16 0949 12/29/16 1346  Weight: 103.9 kg (229 lb) 104.6 kg (230 lb 9.6 oz)   General: Alert, Awake and Oriented to Time, Place and Person. Appear in moderate distress, affect appropriate Eyes: PERRL, Conjunctiva normal ENT: Oral  Mucosa clear moist. Neck: positive JVD, no Abnormal Mass Or lumps Cardiovascular: S1 and S2 Present, no Murmur, Respiratory: Bilateral Air entry equal and Decreased, no use of accessory muscle, bilateral Crackles, no wheezes Abdomen: Bowel Sound present, Soft and diffuse tenderness Skin: no redness, no Rash, no induration Extremities: bilateral Pedal edema, no calf tenderness Neurologic: Grossly no  focal neuro deficit. Bilaterally Equal motor strength  Data Reviewed: CBC:  Recent Labs Lab 12/29/16 1006 12/29/16 1048 12/30/16 0253  WBC 8.8  --  9.5  NEUTROABS 6.3  --   --   HGB 11.3* 11.9* 11.1*  HCT 34.4* 35.0* 34.8*  MCV 81.9  --  83.1  PLT 215  --  458   Basic Metabolic Panel:  Recent Labs Lab 12/29/16 1006 12/29/16 1048 12/30/16 0253  NA 128* 128* 128*  K 4.8 4.7 4.7  CL 94* 95* 95*  CO2 22  --  24  GLUCOSE 155* 145* 138*  BUN 33* 30* 32*  CREATININE 1.10 1.10 1.35*  CALCIUM 9.4  --  9.1    Liver Function Tests:  Recent Labs Lab 12/29/16 1006  AST 72*  ALT 68*  ALKPHOS 126  BILITOT 1.1  PROT 6.6  ALBUMIN 3.6    Recent Labs Lab 12/29/16 1006  LIPASE 22   No results for input(s): AMMONIA in the last 168 hours. Coagulation Profile:  Recent Labs Lab 12/29/16 1006  INR 2.65   Cardiac Enzymes:  Recent Labs Lab 12/29/16 1042 12/30/16 1210 12/30/16 1604  TROPONINI 1.70* 1.43* 1.44*   BNP (last 3 results) No results for input(s): PROBNP in the last 8760 hours. CBG:  Recent Labs Lab 12/29/16 1344  GLUCAP 124*   Studies: Dg Chest 1 View  Result Date: 12/30/2016 CLINICAL DATA:  Thoracentesis on the right. EXAM: CHEST 1 VIEW COMPARISON:  Chest x-ray from yesterday FINDINGS: No residual pleural fluid on the right. No pneumothorax or re-expansion edema. Layering left pleural effusion. Cardiomegaly and pulmonary vascular congestion. IMPRESSION: No acute finding after right thoracentesis. No residual right pleural effusion. Cardiomegaly and pulmonary vascular congestion. Electronically Signed   By: Monte Fantasia M.D.   On: 12/30/2016 16:05   Ir Thoracentesis Asp Pleural Space W/img Guide  Result Date: 12/30/2016 INDICATION: Gastric cancer. Congestive heart failure. Dyspnea. Bilateral pleural effusions on CT scan. Request for diagnostic and therapeutic right thoracentesis. EXAM: ULTRASOUND GUIDED RIGHT THORACENTESIS LIMITED ULTRASOUND OF  THE LEFT CHEST MEDICATIONS: 1% Lidocaine = 16 mL. COMPLICATIONS: None immediate. PROCEDURE: An ultrasound guided thoracentesis was thoroughly discussed with the patient and questions answered. The benefits, risks, alternatives and complications were also discussed. The patient understands and wishes to proceed with the procedure. Written consent was obtained. Ultrasound was performed to localize and mark an adequate pocket of fluid in the right chest. The area was then prepped and draped in the normal sterile fashion. 1% Lidocaine was used for local anesthesia. Under ultrasound guidance a 6 Fr Safe-T-Centesis catheter was introduced. Thoracentesis was performed. The catheter was removed and a dressing applied. FINDINGS: A total of approximately 1.2 liters of clear orange fluid was removed. Samples were sent to the laboratory as requested by the clinical team. Large pleural effusion on the left. IMPRESSION: Successful ultrasound guided right thoracentesis yielding 1.2 liters of pleural fluid. Recommend ultrasound guided left thoracentesis. Read by:  Gareth Eagle, PA-C Electronically Signed   By: Jacqulynn Cadet M.D.   On: 12/30/2016 15:53    Scheduled Meds: . acetaminophen  1,000 mg Oral TID  . aspirin  EC  81 mg Oral Daily  . atorvastatin  80 mg Oral QPM  . carvedilol  6.25 mg Oral BID  . clopidogrel  75 mg Oral Daily  . [START ON 12/31/2016] dexamethasone  4 mg Oral Q breakfast  . lidocaine      . mirtazapine  15 mg Oral QHS  . pantoprazole  40 mg Oral QHS  . senna-docusate  2 tablet Oral QHS   Continuous Infusions: . heparin 1,400 Units/hr (12/29/16 1717)   PRN Meds: lidocaine, nitroGLYCERIN, ondansetron (ZOFRAN) IV, traMADol  Time spent: 35 minutes  Author: Berle Mull, MD [facility] Pager: [phone] 12/30/2016 6:21 PM  If 7PM-7AM, please contact night-coverage at [email or web address], password [facility]

## 2016-12-30 NOTE — Progress Notes (Signed)
Spoke with pt wife about PICC line placement and she had indicated to hold it off til in AM MD rounds as he is sleeping so well, ate a large meal and more importantly talk with the MDs in AM. Cecilie Kicks PA-c updated of above. Had asked her to D/c PICC line placement per IV team request and to reorder it should the family decide they are going to proceed with the plan for PICC placement.

## 2016-12-30 NOTE — Progress Notes (Addendum)
[name] updated with troponin level at 1.43.

## 2016-12-30 NOTE — Progress Notes (Signed)
ANTICOAGULATION CONSULT NOTE - Follow Up Consult  Pharmacy Consult for Heparin (Xarelto on hold) Indication: chest pain/ACS and history of VTE  No Known Allergies  Patient Measurements: Height: 6\' 1"  (185.4 cm) Weight:  (didnt get weight, patient sitting in chair states dizziness) IBW/kg (Calculated) : 79.9  Vital Signs: Temp: 97.9 F (36.6 C) (05/11 0500) Temp Source: Axillary (05/11 0500) BP: 98/63 (05/11 0500) Pulse Rate: 85 (05/11 0500)  Labs:  Recent Labs  12/29/16 1006 12/29/16 1042 12/29/16 1048 12/29/16 1428 12/29/16 2308 12/30/16 0253 12/30/16 1210  HGB 11.3*  --  11.9*  --   --  11.1*  --   HCT 34.4*  --  35.0*  --   --  34.8*  --   PLT 215  --   --   --   --  220  --   APTT  --   --   --  45* 81* 93*  --   LABPROT 28.8*  --   --   --   --   --   --   INR 2.65  --   --   --   --   --   --   HEPARINUNFRC  --   --   --  >2.20*  --  1.40*  --   CREATININE 1.10  --  1.10  --   --  1.35*  --   TROPONINI  --  1.70*  --   --   --   --  1.43*    Estimated Creatinine Clearance: 67.4 mL/min (A) (by C-G formula based on SCr of 1.35 mg/dL (H)).   Assessment: 66 yo male presents to ER with dyspnea and CP to start IV heparin per Rx for possible ACS. Note patient with metastatic gastric cancer and has had multiple VTEs currently on Xarelto.  APTT is therapeutic at 93 today. HL =1.4 (falsel elevated by prior Xarelto). We will monitor heparin drip using the aPTTs for now, given Xarelto influence on anti-Xa levels.  Hgb low /stable. Pltc wnl. No bleeding reported.  Heparin level therapeutic x2 consecutive levels on current heparin drip rate 1400 units/hr.  Goal of Therapy:  Heparin level 0.3-0.7 units/ml aPTT 66-102 seconds Monitor platelets by anticoagulation protocol: Yes   Plan:  -Cont heparin 1400 units/hr Daily aPTT/HL, CBC  Thank you for allowing pharmacy to be part of this patients care team.  Nicole Cella, RPh Clinical Pharmacist Pager: [phone] 8A-4P  [phone] 4P-10P [phone] [place] [phone]  12/30/2016,2:16 PM

## 2016-12-30 NOTE — Progress Notes (Signed)
Will hold picc line and coox for now, MD to discuss with family in AM, they preferred to wait for PICC line.

## 2016-12-30 NOTE — Progress Notes (Signed)
Had spoken with pt wife about PICC line placement and she had indicated that should his condition worsens, a PICC line can be ordered and  placed to get him better but he should not be given heroic measures ( ie, CPR ).

## 2016-12-30 NOTE — Progress Notes (Signed)
Progress Note  Patient Name: [name] Date of Encounter: 12/30/2016  Primary Cardiologist: Dr Tamala Julian  Subjective   The patient feels the same, he feels SOB at rest.  Inpatient Medications    Scheduled Meds: . aspirin EC  81 mg Oral Daily  . atorvastatin  80 mg Oral QPM  . carvedilol  6.25 mg Oral BID  . clopidogrel  75 mg Oral Daily  . dronabinol  5 mg Oral BID AC  . escitalopram  20 mg Oral Daily  . furosemide  40 mg Intravenous Q6H  . pantoprazole  40 mg Oral QHS  . senna-docusate  2 tablet Oral QHS   Continuous Infusions: . heparin 1,400 Units/hr (12/29/16 1717)   PRN Meds: morphine, nitroGLYCERIN, nitroGLYCERIN, ondansetron (ZOFRAN) IV, traMADol   Vital Signs    Vitals:   12/29/16 1309 12/29/16 1346 12/29/16 1902 12/30/16 0500  BP: 107/74 113/67 94/66 98/63   Pulse: 91 90 88 85  Resp: (!) 24 (!) 22 (!) 24 (!) 24  Temp:  97.5 F (36.4 C) 97.7 F (36.5 C) 97.9 F (36.6 C)  TempSrc:  Oral Oral Axillary  SpO2: 97% 99% 99% 98%  Weight:  230 lb 9.6 oz (104.6 kg)    Height:  6\' 1"  (1.854 m)      Intake/Output Summary (Last 24 hours) at 12/30/16 0839 Last data filed at 12/30/16 0405  Gross per 24 hour  Intake            571.2 ml  Output              625 ml  Net            -53.8 ml   Filed Weights   12/29/16 0949 12/29/16 1346  Weight: 229 lb (103.9 kg) 230 lb 9.6 oz (104.6 kg)    Telemetry    SR - Personally Reviewed  Physical Exam  GEN: No acute distress.   Neck: JVD + 6 cm B/L Cardiac: RRR, no murmurs, rubs, or gallops.  Respiratory:decreased breathing sounds and crackles up to mid lungs bilaterally. GI: Soft, nontender, non-distended  MS: trace B/L LE edema; No deformity. Neuro:  Nonfocal  Psych: Normal affect   Labs    Chemistry Recent Labs Lab 12/29/16 1006 12/29/16 1048 12/30/16 0253  NA 128* 128* 128*  K 4.8 4.7 4.7  CL 94* 95* 95*  CO2 22  --  24  GLUCOSE 155* 145* 138*  BUN 33* 30* 32*  CREATININE 1.10 1.10 1.35*    CALCIUM 9.4  --  9.1  PROT 6.6  --   --   ALBUMIN 3.6  --   --   AST 72*  --   --   ALT 68*  --   --   ALKPHOS 126  --   --   BILITOT 1.1  --   --   GFRNONAA >60  --  53*  GFRAA >60  --  >60  ANIONGAP 12  --  9     Hematology Recent Labs Lab 12/29/16 1006 12/29/16 1048 12/30/16 0253  WBC 8.8  --  9.5  RBC 4.20*  --  4.19*  HGB 11.3* 11.9* 11.1*  HCT 34.4* 35.0* 34.8*  MCV 81.9  --  83.1  MCH 26.9  --  26.5  MCHC 32.8  --  31.9  RDW 14.5  --  14.5  PLT 215  --  220    Cardiac Enzymes Recent Labs Lab 12/29/16 1042  TROPONINI 1.70*  Recent Labs Lab 12/29/16 1032  TROPIPOC 1.11*     BNP Recent Labs Lab 12/29/16 1006  BNP 1,472.0*     DDimer No results for input(s): DDIMER in the last 168 hours.   Radiology    Ct Angio Chest Pe W/cm &/or Wo Cm  Result Date: 12/29/2016 CLINICAL DATA:  Chest pain, shortness of Breath EXAM: CT ANGIOGRAPHY CHEST WITH CONTRAST TECHNIQUE: Multidetector CT imaging of the chest was performed using the standard protocol during bolus administration of intravenous contrast. Multiplanar CT image reconstructions and MIPs were obtained to evaluate the vascular anatomy. CONTRAST:  75 cc Isovue 370 IV COMPARISON:  Chest x-ray earlier today. FINDINGS: Cardiovascular: There appears to be a filling defect in a right lower lobe pulmonary artery on image 76. However, this has a fluid fluid meniscus appearance and may represent posteriorly layering contrast. No other filling defects in the pulmonary arteries. Heart is enlarged. Mediastinum/Nodes: Enlarging posterior mediastinal lymph node adjacent to the distal esophagus on image 91, measuring up to 3.2 cm. Scattered borderline mediastinal lymph nodes elsewhere throughout the mediastinum. No axillary or hilar adenopathy. Lungs/Pleura: Large bilateral pleural effusions. Bilateral lower lobe airspace opacities could reflect pneumonia. Upper Abdomen: Numerous enlarged lymph nodes in the upper abdomen in  the region of the gastrohepatic ligament. There are low-density lesions scattered throughout the liver concerning for metastases. Musculoskeletal: Chest wall soft tissues are unremarkable. No acute bony abnormality. Review of the MIP images confirms the above findings. IMPRESSION: There is apparent filling defect in a right lower lobe pulmonary arterial branch, but this appears to be a fluid fluid level of posteriorly layering contrast not felt represent a pulmonary embolus. Cardiomegaly. Large bilateral pleural effusions. Bilateral lower lobe atelectasis or pneumonia. Extensive upper abdominal and posterior mediastinal adenopathy. These lymph nodes have enlarged since prior study. In addition, there are numerous low-density lesions now noted in the liver concerning for metastases. Electronically Signed   By: Rolm Baptise M.D.   On: 12/29/2016 11:31   Dg Chest Port 1 View  Result Date: 12/29/2016 CLINICAL DATA:  Chest pain for 3 days EXAM: PORTABLE CHEST 1 VIEW COMPARISON:  10/11/2016 FINDINGS: Cardiomegaly with bilateral interstitial and alveolar opacities in the mid and lower lungs. Possible small effusions. Findings likely reflect mild edema/ CHF. IMPRESSION: Mild CHF.  Possible small effusions. Electronically Signed   By: Rolm Baptise M.D.   On: 12/29/2016 10:24    Patient Profile     66 y.o. male gastric cancer, recent STEMI admitted with acute combined CHF  Assessment & Plan    1. Chest pain, troponin 1.7 - the patient has evidence of acute combined systolic and diastolic CHF with large B/L pleural effusions, he is SOB at rest, but saturating 100% on [place] 2 L O2. There is possibly an underlying pneumonia, I will have primary team address pneumonia. - I have started lasix however no diuresis and worsening kidney function, I will hold, I would recommend B/L thoracentesis - I will order co-ox and if start milrinone if needed  2. Troponin elevation - demand ischemia in the settings of sepsis and  CHF, ECG unchanged from prior and no ST elevations, o ischemic workup necessary  3. Aggressive gastric adenocarcinoma - the patient is not tolerating chemotherapy - no appetite, I will add marinol - ? Palliative care in the near future   4. CAD - on xarelto and plavix - hold xarelto if any procedure needed, start Lovenox in the morning, he took last dose of xarelto this  am - continue carvedilol, atorvastatin   Signed, Ena Dawley, MD  12/30/2016, 8:39 AM

## 2016-12-31 ENCOUNTER — Inpatient Hospital Stay (HOSPITAL_COMMUNITY): Payer: Medicare Other

## 2016-12-31 DIAGNOSIS — J90 Pleural effusion, not elsewhere classified: Secondary | ICD-10-CM

## 2016-12-31 LAB — CBC
HEMATOCRIT: 33.9 % — AB (ref 39.0–52.0)
HEMOGLOBIN: 10.7 g/dL — AB (ref 13.0–17.0)
MCH: 26.2 pg (ref 26.0–34.0)
MCHC: 31.6 g/dL (ref 30.0–36.0)
MCV: 82.9 fL (ref 78.0–100.0)
Platelets: 225 10*3/uL (ref 150–400)
RBC: 4.09 MIL/uL — ABNORMAL LOW (ref 4.22–5.81)
RDW: 15 % (ref 11.5–15.5)
WBC: 6 10*3/uL (ref 4.0–10.5)

## 2016-12-31 LAB — RENAL FUNCTION PANEL
ANION GAP: 12 (ref 5–15)
Albumin: 3.2 g/dL — ABNORMAL LOW (ref 3.5–5.0)
BUN: 36 mg/dL — ABNORMAL HIGH (ref 6–20)
CALCIUM: 9.3 mg/dL (ref 8.9–10.3)
CO2: 24 mmol/L (ref 22–32)
Chloride: 94 mmol/L — ABNORMAL LOW (ref 101–111)
Creatinine, Ser: 1.18 mg/dL (ref 0.61–1.24)
Glucose, Bld: 171 mg/dL — ABNORMAL HIGH (ref 65–99)
Phosphorus: 4.9 mg/dL — ABNORMAL HIGH (ref 2.5–4.6)
Potassium: 4.6 mmol/L (ref 3.5–5.1)
SODIUM: 130 mmol/L — AB (ref 135–145)

## 2016-12-31 LAB — URINE CULTURE: Culture: NO GROWTH

## 2016-12-31 LAB — HEPARIN LEVEL (UNFRACTIONATED): Heparin Unfractionated: 0.68 IU/mL (ref 0.30–0.70)

## 2016-12-31 LAB — TRIGLYCERIDES, BODY FLUIDS: TRIGLYCERIDES FL: 9 mg/dL

## 2016-12-31 LAB — APTT: aPTT: 93 seconds — ABNORMAL HIGH (ref 24–36)

## 2016-12-31 LAB — MAGNESIUM: MAGNESIUM: 2.3 mg/dL (ref 1.7–2.4)

## 2016-12-31 LAB — LACTATE DEHYDROGENASE: LDH: 338 U/L — AB (ref 98–192)

## 2016-12-31 MED ORDER — LIDOCAINE HCL 1 % IJ SOLN
INTRAMUSCULAR | Status: AC
  Filled 2016-12-31: qty 20

## 2016-12-31 MED ORDER — FUROSEMIDE 10 MG/ML IJ SOLN
60.0000 mg | Freq: Three times a day (TID) | INTRAMUSCULAR | Status: DC
  Administered 2016-12-31 – 2017-01-01 (×3): 60 mg via INTRAVENOUS
  Filled 2016-12-31 (×3): qty 6

## 2016-12-31 MED ORDER — POLYETHYLENE GLYCOL 3350 17 G PO PACK
17.0000 g | PACK | Freq: Every day | ORAL | Status: DC
  Administered 2016-12-31 – 2017-01-01 (×2): 17 g via ORAL
  Filled 2016-12-31 (×2): qty 1

## 2016-12-31 NOTE — Progress Notes (Signed)
ANTICOAGULATION CONSULT NOTE - Follow Up Consult  Pharmacy Consult for Heparin (Xarelto on hold) Indication: chest pain/ACS and history of VTE  No Known Allergies  Patient Measurements: Height: 6\' 1"  (185.4 cm) Weight: 229 lb 8 oz (104.1 kg) IBW/kg (Calculated) : 79.9  Vital Signs: Temp: 98.7 F (37.1 C) (05/12 0500) Temp Source: Oral (05/12 0500) BP: 102/57 (05/12 0500) Pulse Rate: 82 (05/12 0500)  Labs:  Recent Labs  12/29/16 1006  12/29/16 1048  12/29/16 1428 12/29/16 2308 12/30/16 0253 12/30/16 1210 12/30/16 1604 12/30/16 2222 12/31/16 0412 12/31/16 0739  HGB 11.3*  --  11.9*  --   --   --  11.1*  --   --   --  10.7*  --   HCT 34.4*  --  35.0*  --   --   --  34.8*  --   --   --  33.9*  --   PLT 215  --   --   --   --   --  220  --   --   --  225  --   APTT  --   --   --   < > 45* 81* 93*  --   --   --  93*  --   LABPROT 28.8*  --   --   --   --   --   --   --   --   --   --   --   INR 2.65  --   --   --   --   --   --   --   --   --   --   --   HEPARINUNFRC  --   --   --   --  >2.20*  --  1.40*  --   --   --  0.68  --   CREATININE 1.10  --  1.10  --   --   --  1.35*  --   --   --   --  1.18  TROPONINI  --   < >  --   --   --   --   --  1.43* 1.44* 1.01*  --   --   < > = values in this interval not displayed.  Estimated Creatinine Clearance: 77 mL/min (by C-G formula based on SCr of 1.18 mg/dL).   Assessment: 66 yo male presents to ER with dyspnea and CP to start IV heparin per Rx for possible ACS. Note patient with metastatic gastric cancer and recent history of multiple VTEs and extensive PE (currently on Xarelto PTA). Last dose of Xarelto was 5/10 at 8 AM. aPTT is therapeutic at 93 today and heparin level also therapeutic at 0.68. These levels are likely correlating now. CBC stable, no s/s bleeding reported.   Goal of Therapy:  Heparin level 0.3-0.7 units/ml aPTT 66-102 seconds Monitor platelets by anticoagulation protocol: Yes   Plan:  Continue heparin  at 1400 units/hr Daily aPTT/heparin level, CBC Can consider following heparin levels in AM, if continuing to correlate  F/u long-term anticoagulation plan  F/u cardiology plan   Argie Ramming, PharmD Pharmacy Resident  Pager [phone] 12/31/16 9:36 AM

## 2016-12-31 NOTE — Procedures (Signed)
  Left thoracentesis; US guided  700 cc yellow fluid obtained No labs per MD  Tolerated well  CXR pending

## 2016-12-31 NOTE — Progress Notes (Signed)
Subjective:  Extensive conversation with wife who is at bedside with many questions.  Palliative care note read yesterday with recommendations from that team as well as oncologist.  He is feeling better following removal of 1.2 L of fluid with thoracentesis on the right and currently feels somewhat better today.  Ejection fraction yesterday was 25-30% which represents a drop from before and may represent LV dysfunction following his MI in February.  Evidently was supposed to have a coox and a PICC line yesterday that was put on hold last night.  He is comfortable sitting up at the side of the bed but is weak.  Was able to eat some.  No chest pain but still has some mild shortness of breath.  Urine output has not been great.  Creatinine is reduced today.    Objective:  Vital Signs in the last 24 hours: BP (!) 102/57 (BP Location: Left Arm)   Pulse 82   Temp 98.7 F (37.1 C) (Oral)   Resp (!) 22   Ht 6\' 1"  (1.854 m)   Wt 104.1 kg (229 lb 8 oz)   SpO2 100%   BMI 30.28 kg/m   Physical Exam: Somewhat pale elderly male in no acute distress Lungs:  Crackles at the right base, reduced breath sounds at the left base  Cardiac:  Regular rhythm, normal S1 and S2, no S3 Extremities:  1+  edema present  Intake/Output from previous day: 05/11 0701 - 05/12 0700 In: 437.8 [P.O.:75; I.V.:362.8] Out: 325 [Urine:325] Weight Filed Weights   12/29/16 0949 12/29/16 1346 12/31/16 0500  Weight: 103.9 kg (229 lb) 104.6 kg (230 lb 9.6 oz) 104.1 kg (229 lb 8 oz)    Lab Results: Basic Metabolic Panel:  Recent Labs  12/30/16 0253 12/31/16 0739  NA 128* 130*  K 4.7 4.6  CL 95* 94*  CO2 24 24  GLUCOSE 138* 171*  BUN 32* 36*  CREATININE 1.35* 1.18    CBC:  Recent Labs  12/29/16 1006  12/30/16 0253 12/31/16 0412  WBC 8.8  --  9.5 6.0  NEUTROABS 6.3  --   --   --   HGB 11.3*  < > 11.1* 10.7*  HCT 34.4*  < > 34.8* 33.9*  MCV 81.9  --  83.1 82.9  PLT 215  --  220 225  < > = values in this  interval not displayed.  BNP    Component Value Date/Time   BNP 1,472.0 (H) 12/29/2016 1006    Cardiac Panel (last 3 results)  Recent Labs  12/30/16 1210 12/30/16 1604 12/30/16 2222  TROPONINI 1.43* 1.44* 1.01*    PROTIME: Lab Results  Component Value Date   INR 2.65 12/29/2016   INR 1.46 10/11/2016   INR 2.13 09/25/2016    Telemetry:Normal sinus rhythm today.  Her sclerae reviewed   Assessment/Plan:  1.  Acute on chronic systolic heart failure with bilateral pleural effusions 2.  Pleural effusion improved following thoracentesis 3.  Advanced gastric cancer currently in no therapeutic options 4.  History of coagulable state  Recommendations:  At this point in time I would try to increase his furosemide and Cipro can diurese him.  Goals of this will be comfort care.  At this point in time I'm not sure that milrinone and PICC line is the best way to go if we can diurese him otherwise.  We'll see if he can have another thoracentesis to help with his breathing as well as increasing his furosemide.  Creatinine has  improved.     Kerry Hough  MD [facility]  12/31/2016, 10:28 AM

## 2016-12-31 NOTE — Progress Notes (Signed)
[facility] Progress Note  Patient: [name] DJS:[id number]   PCP: Worthy Rancher, MD DOB: 07/15/1951   DOA: 12/29/2016   DOS: 12/31/2016   Date of Service: the patient was seen and examined on 12/31/2016  Subjective: Felt significantly better after thoracentesis yesterday. Breathing is better. Today had an episode of nausea followed by vomiting. Abdominal pain is somewhat controlled.  Brief hospital course: Pt. with PMH of metastatic gastric cancer, recent chemotherapy initiation, extensive DVT, recent CVA, recent CAD; admitted on 12/29/2016, presented with complaint of fatigue tiredness, poor appetite and chest pain, was found to have NSTEMI, acute on chronic systolic CHF. Currently further plan is symptom control.  Assessment and Plan: Chest pain, dyspnea, elevated troponin, ? STEMI, recent anterior wall STEMI, status post LAD PCI with DESon 10/11/2016 - cardiology consulted. - Holding Xarelto, Heparin started instead  - placed on analgesia as needed, Zofran as needed, NTG - pt already on statin and coreg, Plavix  Acute on Chronic combined systolic and diastolic CHF - Echo 88/50/[id number] 10/17/2016 with EF 78-67%, grade 2 diastolic CHF - Monitor daily weights, strict I/O Started on IV Lasix 60 mg every 8 hours. Highly appreciate cardiology input. Underwent right and left thoracentesis for therapeutic purposes. Fluid analysis suggest transudative consistent with CHF. Incentive spirometry ordered.  Metastatic, poorly differentiated gastric adenocarcinoma - With multiple liver metastases, likely lung metastases, extensive abdominal adenopathy and stomach mass - Per recent PET scan 10/10/2016 lymph node involvement consistent with progressive metastatic illness - It was determined the patient is not a surgical candidate I have notified patient's primary oncologist. Palliative care consulted, primary focus is symptom control and treat what is treatable with reasonable  degree. No aggressive or heroic measures.  Extensive bilateral DVT, PE 09/10/2016 - In patient with likely hypercoagulable state likely related to metastatic differentiated gastric adenocarcinoma - On Xarelto, will hold for now and place on heparin drip - Please note that recent antiphospholipid antibody, negative workup  Hyponatremia - Likely prerenal, advance diet as patient able to tolerate - BMP in the morning  Transaminitis - Likely from liver metastases  Diet: cardiac diet DVT Prophylaxis: on therapeutic anticoagulation.  Advance goals of care discussion: DNR DNI  Family Communication: family was present at bedside, at the time of interview. The pt provided permission to discuss medical plan with the family. Opportunity was given to ask question and all questions were answered satisfactorily.   Disposition:  Discharge to home.  Consultants: cardiology palliative care Procedures: Echocardiogram  thoracentesis right and left   Antibiotics: Anti-infectives    None       Objective: Physical Exam: Vitals:   12/31/16 1100 12/31/16 1225 12/31/16 1253 12/31/16 1500  BP: 98/61 105/62 107/63 (!) 98/50  Pulse:    86  Resp:      Temp:    98 F (36.7 C)  TempSrc:    Oral  SpO2:    100%  Weight:      Height:        Intake/Output Summary (Last 24 hours) at 12/31/16 1648 Last data filed at 12/31/16 0907  Gross per 24 hour  Intake           437.83 ml  Output              725 ml  Net          -287.17 ml   Filed Weights   12/29/16 0949 12/29/16 1346 12/31/16 0500  Weight: 103.9 kg (229 lb) 104.6 kg (230 lb  9.6 oz) 104.1 kg (229 lb 8 oz)   General: Alert, Awake and Oriented to Time, Place and Person. Appear in moderate distress, affect appropriate Eyes: PERRL, Conjunctiva normal ENT: Oral Mucosa clear moisty. Neck: mild JVD, no Abnormal Mass Or lumps Cardiovascular: S1 and S2 Present, no Murmur, Respiratory: Bilateral Air entry equal and Decreased, no use of  accessory muscle, basal crackles, no wheezes Abdomen: Bowel Sound present, Soft and mild tenderness Skin: no redness, no Rash, no induration Extremities: bilateral Pedal edema, no calf tenderness Neurologic: Grossly no focal neuro deficit. Bilaterally Equal motor strength  Data Reviewed: CBC:  Recent Labs Lab 12/29/16 1006 12/29/16 1048 12/30/16 0253 12/31/16 0412  WBC 8.8  --  9.5 6.0  NEUTROABS 6.3  --   --   --   HGB 11.3* 11.9* 11.1* 10.7*  HCT 34.4* 35.0* 34.8* 33.9*  MCV 81.9  --  83.1 82.9  PLT 215  --  220 161   Basic Metabolic Panel:  Recent Labs Lab 12/29/16 1006 12/29/16 1048 12/30/16 0253 12/31/16 0739  NA 128* 128* 128* 130*  K 4.8 4.7 4.7 4.6  CL 94* 95* 95* 94*  CO2 22  --  24 24  GLUCOSE 155* 145* 138* 171*  BUN 33* 30* 32* 36*  CREATININE 1.10 1.10 1.35* 1.18  CALCIUM 9.4  --  9.1 9.3  MG  --   --   --  2.3  PHOS  --   --   --  4.9*    Liver Function Tests:  Recent Labs Lab 12/29/16 1006 12/31/16 0739  AST 72*  --   ALT 68*  --   ALKPHOS 126  --   BILITOT 1.1  --   PROT 6.6  --   ALBUMIN 3.6 3.2*    Recent Labs Lab 12/29/16 1006  LIPASE 22   No results for input(s): AMMONIA in the last 168 hours. Coagulation Profile:  Recent Labs Lab 12/29/16 1006  INR 2.65   Cardiac Enzymes:  Recent Labs Lab 12/29/16 1042 12/30/16 1210 12/30/16 1604 12/30/16 2222  TROPONINI 1.70* 1.43* 1.44* 1.01*   BNP (last 3 results) No results for input(s): PROBNP in the last 8760 hours. CBG:  Recent Labs Lab 12/29/16 1344  GLUCAP 124*   Studies: Dg Chest 1 View  Result Date: 12/31/2016 CLINICAL DATA:  66 year old male status post left thoracentesis EXAM: CHEST 1 VIEW COMPARISON:  Pre thoracentesis chest x-ray 12/30/2016 FINDINGS: No evidence of a pneumothorax. Significant interval improvement in left pleural effusion. Persistent mild bibasilar atelectasis. Stable cardiomegaly. No acute osseous abnormality. IMPRESSION: No evidence of  pneumothorax following left-sided thoracentesis. Electronically Signed   By: Jacqulynn Cadet M.D.   On: 12/31/2016 13:03   US Thoracentesis Asp Pleural Space W/img Guide  Result Date: 12/31/2016 INDICATION: Symptomatic left sided pleural effusion EXAM: US THORACENTESIS ASP PLEURAL SPACE W/IMG GUIDE COMPARISON:  None MEDICATIONS: 10 cc 1% lidocaine. COMPLICATIONS: None immediate. TECHNIQUE: Informed written consent was obtained from the patient after a discussion of the risks, benefits and alternatives to treatment. A timeout was performed prior to the initiation of the procedure. Initial ultrasound scanning demonstrates a left pleural effusion. The lower chest was prepped and draped in the usual sterile fashion. 1% lidocaine was used for local anesthesia. Under direct ultrasound guidance, a 19 gauge, 7-cm, Yueh catheter was introduced. An ultrasound image was saved for documentation purposes. The thoracentesis was performed. The catheter was removed and a dressing was applied. The patient tolerated the procedure well without immediate  post procedural complication. The patient was escorted to have an upright chest radiograph. FINDINGS: A total of approximately 700 cc of yellow fluid was removed. Requested samples were sent to the laboratory. IMPRESSION: Successful ultrasound-guided left sided thoracentesis yielding 700 cc of pleural fluid. Read by Lavonia Drafts [facility] Electronically Signed   By: Jacqulynn Cadet M.D.   On: 12/31/2016 12:53    Scheduled Meds: . acetaminophen  1,000 mg Oral TID  . aspirin EC  81 mg Oral Daily  . atorvastatin  80 mg Oral QPM  . carvedilol  6.25 mg Oral BID  . clopidogrel  75 mg Oral Daily  . dexamethasone  4 mg Oral Q breakfast  . furosemide  60 mg Intravenous Q8H  . lidocaine      . mirtazapine  15 mg Oral QHS  . pantoprazole  40 mg Oral QHS  . polyethylene glycol  17 g Oral Daily  . senna-docusate  2 tablet Oral QHS   Continuous Infusions: . heparin 1,400  Units/hr (12/31/16 4132)   PRN Meds: lidocaine, nitroGLYCERIN, ondansetron (ZOFRAN) IV, traMADol  Time spent: 35 minutes  Author: Berle Mull, MD [facility] Pager: [phone] 12/31/2016 4:48 PM  If 7PM-7AM, please contact night-coverage at [email or web address], password [facility]

## 2016-12-31 NOTE — Progress Notes (Signed)
[name]   HEMATOLOGY/ONCOLOGY INPATIENT PROGRESS NOTE  Date of Service:.. 12/30/2016 Inpatient Attending: .Lavina Hamman, MD   SUBJECTIVE  Mr [name] was admitted to the hospital with shortness of breath and chest tightness and noted to have non-STEMI with CHL decompensation +/- pneumonia. Wife notes that he started feeling severely fatigued after about just 3 days of the oral Xeloda and having received the [facility] and stop taking the oral chemotherapy.  As I had discussed previously with the patient and his wife the goals of treatment are only palliative and we did discuss supportive cares as an option prior to starting palliative chemotherapy. It is clear in talking with his wife and looking at his current medical situation that he will not tolerate even significantly dosed reduced palliative chemotherapy in this does not appear to be helping him maintain his quality of life. We discussed that he has a significantly greater prognosis and that we would not recommend any additional palliative chemotherapy at this time. The wife who typically speaks and is a strong advocate for her husband notes that they have discussed it and are not inclined to pursue additional chemotherapy which is a very reasonable decision. She notes that their main goal is to try to spend some time together and try to go on a cruise towards the end of this month. She understands that his else might not allow this but still wants to try. Palliative care Dr. Rhea Pink has met with the patient and his family and is helping with supportive cares which is perfect. I had a long discussion with patient's wife since she is quite anxious and tearful about [name]'s grave prognosis and what his departure would mean for her life, financial situation and their business.    OBJECTIVE:  NAD  PHYSICAL EXAMINATION: . Vitals:   12/30/16 1446 12/30/16 1900 12/30/16 2326 12/31/16 0500  BP: 98/60 (!) 99/59 102/66 (!) 102/57  Pulse:  82 87 83 82  Resp: (!) 22 (!) 21  (!) 22  Temp: 97.7 F (36.5 C) 98 F (36.7 C)  98.7 F (37.1 C)  TempSrc: Oral Oral  Oral  SpO2: 99% 97% 100% 100%  Weight:    229 lb 8 oz (104.1 kg)  Height:       Filed Weights   12/29/16 0949 12/29/16 1346 12/31/16 0500  Weight: 229 lb (103.9 kg) 230 lb 9.6 oz (104.6 kg) 229 lb 8 oz (104.1 kg)   .Body mass index is 30.28 kg/m.  GENERAL:alert, in no acute distress and comfortable NECK: supple, + JVD, thyroid normal size, non-tender, without nodularity LYMPH:  no palpable lymphadenopathy in the cervical, axillary or inguinal LUNGS:b/l basal rales HEART: regular rate & rhythm ABDOMEN: mildly distended  Musculoskeletal: no cyanosis of digits and no clubbing  PSYCH: alert & oriented x 3 with fluent speech NEURO: no focal motor/sensory deficits  MEDICAL HISTORY:  Past Medical History:  Diagnosis Date  . Anginal pain ([place])   . CAD in native artery    a. STEMI 09/2016 s/p DES to mLAD, residual 65-75% OM1 disease treated medically. EF 30-35% by cath, 35-40% by echo at dc.  . Chronic systolic CHF (congestive heart failure) ([place])    a. Dx 09/2016 - EF 30-35% by cath, 35-40% by echo.  . Depression   . Dilatation of aorta ([place])    a. CT 09/2016: dilation of the ascending thoracic aorta measuring 4.4 cm in maximum diameter, recommend f/u 2021 if appropriate.  [name] Kitchen DVT (deep venous thrombosis) ([place])  a. Bilat DVT noted 08/2016.  . Essential hypertension   . GERD (gastroesophageal reflux disease)   . 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 History of stroke    a. 09/2016.  [name] Kitchen Hypercoagulable state ([place])   . Hyperlipidemia   . Hyponatremia   . Ischemic cardiomyopathy   . Lymphadenopathy    a. Abd lymphadenopathy concerning for malignancy noted in 09/2016.  [name] Kitchen Normocytic anemia   . PE (pulmonary thromboembolism) ([place])    a. Bilat PE 08/2016.  . Right leg weakness    secondary to stroke  . Sleep apnea   . Stroke ([place])   . Suspected sleep apnea      SURGICAL HISTORY: Past Surgical History:  Procedure Laterality Date  . adnoidectomy    . APPENDECTOMY    . CORONARY STENT INTERVENTION N/A 10/11/2016   Procedure: Coronary Stent Intervention;  Surgeon: Belva Crome, MD;  Location: [place] [facility];  Service: Cardiovascular;  Laterality: N/A;  . ESOPHAGOGASTRODUODENOSCOPY N/A 12/09/2016   Procedure: ESOPHAGOGASTRODUODENOSCOPY (EGD);  Surgeon: Doran Stabler, MD;  Location: [place];  Service: Gastroenterology;  Laterality: N/A;  . IR THORACENTESIS ASP PLEURAL SPACE W/IMG GUIDE  12/30/2016  . LEFT HEART CATH AND CORONARY ANGIOGRAPHY N/A 10/11/2016   Procedure: Left Heart Cath and Coronary Angiography;  Surgeon: Belva Crome, MD;  Location: [place] [facility];  Service: Cardiovascular;  Laterality: N/A;  . PROSTATE SURGERY    . TONSILLECTOMY AND ADENOIDECTOMY      SOCIAL HISTORY: Social History   Social History  . Marital status: Married    Spouse name: N/A  . Number of children: N/A  . Years of education: N/A   Occupational History  . Not on file.   Social History Main Topics  . Smoking status: Never Smoker  . Smokeless tobacco: Never Used  . Alcohol use No  . Drug use: No  . Sexual activity: Yes    Birth control/ protection: Post-menopausal     Comment: Married for 16 years   Other Topics Concern  . Not on file   Social History Narrative   Epworth Sleepiness Scale = 12 (as of 09/09/2015)    FAMILY HISTORY: Family History  Problem Relation Age of Onset  . COPD Mother   . Heart disease Mother   . AAA (abdominal aortic aneurysm) Mother   . Heart disease Father   . Depression Father   . Diabetes Father   . Heart disease Brother   . Early death Brother        heart attack  . Heart disease Maternal Grandmother   . Heart disease Maternal Grandfather   . Stroke Paternal Grandmother   . Heart disease Brother   . Diabetes Brother     ALLERGIES:  has No Known Allergies.  MEDICATIONS:  Scheduled  Meds: . acetaminophen  1,000 mg Oral TID  . aspirin EC  81 mg Oral Daily  . atorvastatin  80 mg Oral QPM  . carvedilol  6.25 mg Oral BID  . clopidogrel  75 mg Oral Daily  . dexamethasone  4 mg Oral Q breakfast  . mirtazapine  15 mg Oral QHS  . pantoprazole  40 mg Oral QHS  . senna-docusate  2 tablet Oral QHS   Continuous Infusions: . heparin 1,400 Units/hr (12/31/16 0652)   PRN Meds:.lidocaine, nitroGLYCERIN, ondansetron (ZOFRAN) IV, traMADol  REVIEW OF SYSTEMS:    10 Point review of Systems was done is negative except as  noted above.   LABORATORY DATA:  I have reviewed the data as listed  . CBC Latest Ref Rng & Units 12/31/2016 12/30/2016 12/29/2016  WBC 4.0 - 10.5 K/uL 6.0 9.5 -  Hemoglobin 13.0 - 17.0 g/dL 10.7(L) 11.1(L) 11.9(L)  Hematocrit 39.0 - 52.0 % 33.9(L) 34.8(L) 35.0(L)  Platelets 150 - 400 K/uL 225 220 -    . CMP Latest Ref Rng & Units 12/31/2016 12/30/2016 12/29/2016  Glucose 65 - 99 mg/dL 638(G) 536(I) 680(H)  BUN 6 - 20 mg/dL 21(Y) 24(M) 25(O)  Creatinine 0.61 - 1.24 mg/dL 0.37 0.48(G) 8.91  Sodium 135 - 145 mmol/L 130(L) 128(L) 128(L)  Potassium 3.5 - 5.1 mmol/L 4.6 4.7 4.7  Chloride 101 - 111 mmol/L 94(L) 95(L) 95(L)  CO2 22 - 32 mmol/L 24 24 -  Calcium 8.9 - 10.3 mg/dL 9.3 9.1 -  Total Protein 6.5 - 8.1 g/dL - - -  Total Bilirubin 0.3 - 1.2 mg/dL - - -  Alkaline Phos 38 - 126 U/L - - -  AST 15 - 41 U/L - - -  ALT 17 - 63 U/L - - -     RADIOGRAPHIC STUDIES: I have personally reviewed the radiological images as listed and agreed with the findings in the report. Dg Chest 1 View  Result Date: 12/30/2016 CLINICAL DATA:  Thoracentesis on the right. EXAM: CHEST 1 VIEW COMPARISON:  Chest x-ray from yesterday FINDINGS: No residual pleural fluid on the right. No pneumothorax or re-expansion edema. Layering left pleural effusion. Cardiomegaly and pulmonary vascular congestion. IMPRESSION: No acute finding after right thoracentesis. No residual right pleural  effusion. Cardiomegaly and pulmonary vascular congestion. Electronically Signed   By: Marnee Spring M.D.   On: 12/30/2016 16:05   Ct Angio Chest Pe W/cm &/or Wo Cm  Result Date: 12/29/2016 CLINICAL DATA:  Chest pain, shortness of Breath EXAM: CT ANGIOGRAPHY CHEST WITH CONTRAST TECHNIQUE: Multidetector CT imaging of the chest was performed using the standard protocol during bolus administration of intravenous contrast. Multiplanar CT image reconstructions and MIPs were obtained to evaluate the vascular anatomy. CONTRAST:  75 cc Isovue 370 IV COMPARISON:  Chest x-ray earlier today. FINDINGS: Cardiovascular: There appears to be a filling defect in a right lower lobe pulmonary artery on image 76. However, this has a fluid fluid meniscus appearance and may represent posteriorly layering contrast. No other filling defects in the pulmonary arteries. Heart is enlarged. Mediastinum/Nodes: Enlarging posterior mediastinal lymph node adjacent to the distal esophagus on image 91, measuring up to 3.2 cm. Scattered borderline mediastinal lymph nodes elsewhere throughout the mediastinum. No axillary or hilar adenopathy. Lungs/Pleura: Large bilateral pleural effusions. Bilateral lower lobe airspace opacities could reflect pneumonia. Upper Abdomen: Numerous enlarged lymph nodes in the upper abdomen in the region of the gastrohepatic ligament. There are low-density lesions scattered throughout the liver concerning for metastases. Musculoskeletal: Chest wall soft tissues are unremarkable. No acute bony abnormality. Review of the MIP images confirms the above findings. IMPRESSION: There is apparent filling defect in a right lower lobe pulmonary arterial branch, but this appears to be a fluid fluid level of posteriorly layering contrast not felt represent a pulmonary embolus. Cardiomegaly. Large bilateral pleural effusions. Bilateral lower lobe atelectasis or pneumonia. Extensive upper abdominal and posterior mediastinal  adenopathy. These lymph nodes have enlarged since prior study. In addition, there are numerous low-density lesions now noted in the liver concerning for metastases. Electronically Signed   By: Charlett Nose M.D.   On: 12/29/2016 11:31   Dg Chest  Port 1 View  Result Date: 12/29/2016 CLINICAL DATA:  Chest pain for 3 days EXAM: PORTABLE CHEST 1 VIEW COMPARISON:  10/11/2016 FINDINGS: Cardiomegaly with bilateral interstitial and alveolar opacities in the mid and lower lungs. Possible small effusions. Findings likely reflect mild edema/ CHF. IMPRESSION: Mild CHF.  Possible small effusions. Electronically Signed   By: Rolm Baptise M.D.   On: 12/29/2016 10:24   Ir Thoracentesis Asp Pleural Space W/img Guide  Result Date: 12/30/2016 INDICATION: Gastric cancer. Congestive heart failure. Dyspnea. Bilateral pleural effusions on CT scan. Request for diagnostic and therapeutic right thoracentesis. EXAM: ULTRASOUND GUIDED RIGHT THORACENTESIS LIMITED ULTRASOUND OF THE LEFT CHEST MEDICATIONS: 1% Lidocaine = 16 mL. COMPLICATIONS: None immediate. PROCEDURE: An ultrasound guided thoracentesis was thoroughly discussed with the patient and questions answered. The benefits, risks, alternatives and complications were also discussed. The patient understands and wishes to proceed with the procedure. Written consent was obtained. Ultrasound was performed to localize and mark an adequate pocket of fluid in the right chest. The area was then prepped and draped in the normal sterile fashion. 1% Lidocaine was used for local anesthesia. Under ultrasound guidance a 6 Fr Safe-T-Centesis catheter was introduced. Thoracentesis was performed. The catheter was removed and a dressing applied. FINDINGS: A total of approximately 1.2 liters of clear orange fluid was removed. Samples were sent to the laboratory as requested by the clinical team. Large pleural effusion on the left. IMPRESSION: Successful ultrasound guided right thoracentesis yielding  1.2 liters of pleural fluid. Recommend ultrasound guided left thoracentesis. Read by:  Gareth Eagle, PA-C Electronically Signed   By: Jacqulynn Cadet M.D.   On: 12/30/2016 15:53    ASSESSMENT & PLAN:   66 year old male with complicated medical situation is highly above with  1) metastatic poorly differentiated gastric adenocarcinoma Her2 neg by immunohistochemistry. Patient has multiple liver metastases, likely lung metastases, extensive abdominal adenopathy and stomach mass. MSI testing pending. 2) Multiple thrombotic events (venous and arterial) ECHO no RT to left shunt. Extensive DVT/PE 2/94/7654 B/l embolic CVA's 01/24/353 Anterior wall STEMI s/p LAD PCI with DES on 10/11/2016.  Hypercoagulable status likely related to his metastatic differentiated gastric adenocarcinoma. Some fhx of clotting issues in his mother per wife but negative factor V Leiden and prothrombin gene mutation. Negative workup for antiphospholipid antibody syndrome. 3) Admitted with Shortness of breath and chest discomfort - noted to have NSTEMI with heart failure with ?pneumonia with significant b/l pleural effusion - likely related to CHF r/p parapneumonic or malignant pleural effusion. Plan -patient was going down for b/l thoracentesis - diagnostic and therapeutic which is appropriate -I discussed goals of care in details with his wife who has been helping him predominantly making medical decisions through this entire decision making process. -patient is not a good candidate for any additional palliative chemotherapy and could not tolerate even significantly dose reduced doses of chemotherapy. -would strongly recommend best support cares through hospice. -provided supportive counseling to patient's wife who is understandably stressed due to patients grave prognosis, perceived upcoming financial stressors. -appreciate input and help by the palliative care, hospital medicine and cardiology teams. -kindly let me  know if I can be of any other additional asistance. -We discussed goals of care in detail and the fact that treatment is not curative and if tolerated might serve to delay more bothersome symptoms for a while.   I spent 20 minutes counseling the patient face to face. The total time spent in the appointment was 25 minutes and more than 50% was  on counseling and direct patient cares.    Sullivan Lone MD [place] AAHIVMS [facility] Hematology/Oncology Physician [facility]  (Office):       [phone] (Work cell):  [phone] (Fax):           [phone]

## 2016-12-31 NOTE — Progress Notes (Signed)
Pharmacist Heart Failure Core Measure Documentation  Assessment: [name] has an EF documented as 25-30% on 5/11 by ECHO.  Rationale: Heart failure patients with left ventricular systolic dysfunction (LVSD) and an EF < 40% should be prescribed an angiotensin converting enzyme inhibitor (ACEI) or angiotensin receptor blocker (ARB) at discharge unless a contraindication is documented in the medical record.  This patient is not currently on an ACEI or ARB for HF.  This note is being placed in the record in order to provide documentation that a contraindication to the use of these agents is present for this encounter.  ACE Inhibitor or Angiotensin Receptor Blocker is contraindicated (specify all that apply)  []   ACEI allergy AND ARB allergy []   Angioedema []   Moderate or severe aortic stenosis []   Hyperkalemia [x]   Hypotension []   Renal artery stenosis []   Worsening renal function, preexisting renal disease or dysfunction   Demetrius Charity, PharmD Acute Care Pharmacy Resident  Pager: [phone] 12/31/2016

## 2017-01-01 LAB — RENAL FUNCTION PANEL
ANION GAP: 11 (ref 5–15)
Albumin: 3.1 g/dL — ABNORMAL LOW (ref 3.5–5.0)
BUN: 36 mg/dL — ABNORMAL HIGH (ref 6–20)
CALCIUM: 9.1 mg/dL (ref 8.9–10.3)
CHLORIDE: 96 mmol/L — AB (ref 101–111)
CO2: 25 mmol/L (ref 22–32)
CREATININE: 1.19 mg/dL (ref 0.61–1.24)
GFR calc Af Amer: >60 mL/min (ref >60)
GFR calc non Af Amer: >60 mL/min (ref >60)
Glucose, Bld: 153 mg/dL — ABNORMAL HIGH (ref 65–99)
Phosphorus: 3.7 mg/dL (ref 2.5–4.6)
Potassium: 4.3 mmol/L (ref 3.5–5.1)
SODIUM: 132 mmol/L — AB (ref 135–145)

## 2017-01-01 LAB — MAGNESIUM: MAGNESIUM: 2.2 mg/dL (ref 1.7–2.4)

## 2017-01-01 LAB — CBC
HCT: 34 % — ABNORMAL LOW (ref 39.0–52.0)
Hemoglobin: 11 g/dL — ABNORMAL LOW (ref 13.0–17.0)
MCH: 27.4 pg (ref 26.0–34.0)
MCHC: 32.4 g/dL (ref 30.0–36.0)
MCV: 84.6 fL (ref 78.0–100.0)
Platelets: 234 10*3/uL (ref 150–400)
RBC: 4.02 MIL/uL — ABNORMAL LOW (ref 4.22–5.81)
RDW: 15.7 % — ABNORMAL HIGH (ref 11.5–15.5)
WBC: 11.3 10*3/uL — ABNORMAL HIGH (ref 4.0–10.5)

## 2017-01-01 LAB — APTT: APTT: 99 s — AB (ref 24–36)

## 2017-01-01 LAB — HEPARIN LEVEL (UNFRACTIONATED): Heparin Unfractionated: 0.74 IU/mL — ABNORMAL HIGH (ref 0.30–0.70)

## 2017-01-01 MED ORDER — RIVAROXABAN 20 MG PO TABS
20.0000 mg | ORAL_TABLET | Freq: Every day | ORAL | Status: DC
  Administered 2017-01-01 – 2017-01-02 (×2): 20 mg via ORAL
  Filled 2017-01-01 (×2): qty 1

## 2017-01-01 MED ORDER — FUROSEMIDE 10 MG/ML IJ SOLN
80.0000 mg | Freq: Three times a day (TID) | INTRAMUSCULAR | Status: DC
  Administered 2017-01-01 – 2017-01-02 (×4): 80 mg via INTRAVENOUS
  Filled 2017-01-01 (×4): qty 8

## 2017-01-01 NOTE — Progress Notes (Addendum)
[facility] Progress Note  Patient: [name] ZOX:[id number]   PCP: Worthy Rancher, MD DOB: 1951/06/11   DOA: 12/29/2016   DOS: 01/01/2017   Date of Service: the patient was seen and examined on 01/01/2017  Subjective: Continues to feel better, no chest pain. Abdominal pain better as well.  Brief hospital course: Pt. with PMH of metastatic gastric cancer, recent chemotherapy initiation, extensive DVT, recent CVA, recent CAD; admitted on 12/29/2016, presented with complaint of fatigue tiredness, poor appetite and chest pain, was found to have NSTEMI, acute on chronic systolic CHF. Currently further plan is symptom control.  Assessment and Plan: Chest pain, dyspnea, elevated troponin, ? STEMI, recent anterior wall STEMI, status post LAD PCI with DESon 10/11/2016 - cardiology consulted. - Transitioning back to Xarelto, Heparin infusion stopped. - placed on analgesia as needed, Zofran as needed, NTG - pt already on statin and coreg, Plavix  Acute on Chronic combined systolic and diastolic CHF - Echo 81/19/1478GNF 10/17/2016 with EF 62-13%, grade 2 diastolic CHF - Monitor daily weights, strict I/O Started on IV Lasix 60 mg every 8 hours. Increasing to 80 mg every 8 hours Highly appreciate cardiology input. Underwent right and left thoracentesis for therapeutic purposes. Fluid analysis suggest transudative consistent with CHF. Incentive spirometry ordered. Educated patient regarding how to do it.  Metastatic, poorly differentiated gastric adenocarcinoma - With multiple liver metastases, likely lung metastases, extensive abdominal adenopathy and stomach mass - Per recent PET scan 10/10/2016 lymph node involvement consistent with progressive metastatic illness - It was determined the patient is not a surgical candidate Palliative care consulted, primary focus is symptom control and treat what is treatable with reasonable degree. No aggressive or heroic measures.  Extensive  bilateral DVT, PE 09/10/2016 - In patient with likely hypercoagulable state likely related to metastatic differentiated gastric adenocarcinoma - On Xarelto, was on hold, now since the heparin drip has been completed with transition to Xarelto again. Complicated history, patient has been on Xarelto in the past and completed 21 day loading but then transitioned to Lovenox. Recently one week ago started back on Xarelto with loading. Given that the patient has remained consistently anticoagulated since diagnosis of DVT in January 2018 it was felt that the patient does not need any more loading of Xarelto and can be transitioned directly to 20 mg daily dose. This was discussed with hematology on call who agrees with the plan. recent antiphospholipid antibody workup negative.  Hyponatremia Getting better. Continue to monitor.  Transaminitis - Likely from liver metastases  Diet: cardiac diet DVT Prophylaxis: on therapeutic anticoagulation.  Advance goals of care discussion: DNR DNI  Family Communication: family was present at bedside, at the time of interview. The pt provided permission to discuss medical plan with the family. Opportunity was given to ask question and all questions were answered satisfactorily.   Disposition:  Discharge to home.  Consultants: cardiology palliative care Procedures: Echocardiogram  thoracentesis right and left   Antibiotics: Anti-infectives    None       Objective: Physical Exam: Vitals:   12/31/16 1500 12/31/16 2227 01/01/17 0415 01/01/17 1328  BP: (!) 98/50 (!) 103/57 130/77 (!) 107/59  Pulse: 86 84 83 81  Resp:  16  18  Temp: 98 F (36.7 C) 97.4 F (36.3 C) 98.3 F (36.8 C) 98.2 F (36.8 C)  TempSrc: Oral Oral Oral Oral  SpO2: 100% 100% 96%   Weight:   99.9 kg (220 lb 3.2 oz)   Height:  Intake/Output Summary (Last 24 hours) at 01/01/17 1759 Last data filed at 01/01/17 1300  Gross per 24 hour  Intake              480 ml    Output             2100 ml  Net            -1620 ml   Filed Weights   12/29/16 1346 12/31/16 0500 01/01/17 0415  Weight: 104.6 kg (230 lb 9.6 oz) 104.1 kg (229 lb 8 oz) 99.9 kg (220 lb 3.2 oz)   General: Alert, Awake and Oriented to Time, Place and Person. Appear in mild distress, affect appropriate Eyes: PERRL, Conjunctiva normal ENT: Oral Mucosa clear moist. Neck: difficult to assess JVD, no Abnormal Mass Or lumps Cardiovascular: S1 and S2 Present no Murmur, Peripheral Pulses Present Respiratory: Bilateral Air entry equal and Decreased, no use of accessory muscle, basal Crackles, no wheezes Abdomen: Bowel Sound present, Soft and no tenderness Skin: redness no, no Rash, no induration Extremities: bilateral Pedal edema, no calf tenderness Neurologic: Grossly no focal neuro deficit. Bilaterally Equal motor strength  Data Reviewed: CBC:  Recent Labs Lab 12/29/16 1006 12/29/16 1048 12/30/16 0253 12/31/16 0412 01/01/17 0446  WBC 8.8  --  9.5 6.0 11.3*  NEUTROABS 6.3  --   --   --   --   HGB 11.3* 11.9* 11.1* 10.7* 11.0*  HCT 34.4* 35.0* 34.8* 33.9* 34.0*  MCV 81.9  --  83.1 82.9 84.6  PLT 215  --  220 225 920   Basic Metabolic Panel:  Recent Labs Lab 12/29/16 1006 12/29/16 1048 12/30/16 0253 12/31/16 0739 01/01/17 0446  NA 128* 128* 128* 130* 132*  K 4.8 4.7 4.7 4.6 4.3  CL 94* 95* 95* 94* 96*  CO2 22  --  24 24 25   GLUCOSE 155* 145* 138* 171* 153*  BUN 33* 30* 32* 36* 36*  CREATININE 1.10 1.10 1.35* 1.18 1.19  CALCIUM 9.4  --  9.1 9.3 9.1  MG  --   --   --  2.3 2.2  PHOS  --   --   --  4.9* 3.7    Liver Function Tests:  Recent Labs Lab 12/29/16 1006 12/31/16 0739 01/01/17 0446  AST 72*  --   --   ALT 68*  --   --   ALKPHOS 126  --   --   BILITOT 1.1  --   --   PROT 6.6  --   --   ALBUMIN 3.6 3.2* 3.1*    Recent Labs Lab 12/29/16 1006  LIPASE 22   No results for input(s): AMMONIA in the last 168 hours. Coagulation Profile:  Recent Labs Lab  12/29/16 1006  INR 2.65   Cardiac Enzymes:  Recent Labs Lab 12/29/16 1042 12/30/16 1210 12/30/16 1604 12/30/16 2222  TROPONINI 1.70* 1.43* 1.44* 1.01*   BNP (last 3 results) No results for input(s): PROBNP in the last 8760 hours. CBG:  Recent Labs Lab 12/29/16 1344  GLUCAP 124*   Studies: No results found.  Scheduled Meds: . acetaminophen  1,000 mg Oral TID  . aspirin EC  81 mg Oral Daily  . atorvastatin  80 mg Oral QPM  . carvedilol  6.25 mg Oral BID  . clopidogrel  75 mg Oral Daily  . dexamethasone  4 mg Oral Q breakfast  . furosemide  80 mg Intravenous Q8H  . mirtazapine  15 mg Oral QHS  .  pantoprazole  40 mg Oral QHS  . polyethylene glycol  17 g Oral Daily  . rivaroxaban  20 mg Oral Daily  . senna-docusate  2 tablet Oral QHS   Continuous Infusions:  PRN Meds: lidocaine, nitroGLYCERIN, ondansetron (ZOFRAN) IV, traMADol  Time spent: 35 minutes  Author: Berle Mull, MD [facility] Pager: [phone] 01/01/2017 5:59 PM  If 7PM-7AM, please contact night-coverage at [email or web address], password [facility]

## 2017-01-01 NOTE — Progress Notes (Addendum)
Subjective:  He had thoracentesis of 700 cc on the left lung yesterday.  A total of almost 2 L has been taken out of his chest.  With increasing his diuretics yesterday he has been diuresing better.  Still edematous and weak.   Objective:  Vital Signs in the last 24 hours: BP 130/77 (BP Location: Right Arm)   Pulse 83   Temp 98.3 F (36.8 C) (Oral)   Resp 16   Ht 6\' 1"  (1.854 m)   Wt 99.9 kg (220 lb 3.2 oz)   SpO2 96%   BMI 29.05 kg/m   Physical Exam: Somewhat pale elderly male in no acute distress mildly dyspneic with talking. Lungs:  Crackles at the right base, reduced breath sounds at the left base  Cardiac:  Regular rhythm, normal S1 and S2, no S3 Extremities:  2+  edema present  Intake/Output from previous day: 05/12 0701 - 05/13 0700 In: -  Out: 1150 [Urine:1150] Weight Filed Weights   12/29/16 1346 12/31/16 0500 01/01/17 0415  Weight: 104.6 kg (230 lb 9.6 oz) 104.1 kg (229 lb 8 oz) 99.9 kg (220 lb 3.2 oz)    Lab Results: Basic Metabolic Panel:  Recent Labs  12/31/16 0739 01/01/17 0446  NA 130* 132*  K 4.6 4.3  CL 94* 96*  CO2 24 25  GLUCOSE 171* 153*  BUN 36* 36*  CREATININE 1.18 1.19    CBC:  Recent Labs  12/29/16 1006  12/31/16 0412 01/01/17 0446  WBC 8.8  < > 6.0 11.3*  NEUTROABS 6.3  --   --   --   HGB 11.3*  < > 10.7* 11.0*  HCT 34.4*  < > 33.9* 34.0*  MCV 81.9  < > 82.9 84.6  PLT 215  < > 225 234  < > = values in this interval not displayed.  BNP    Component Value Date/Time   BNP 1,472.0 (H) 12/29/2016 1006    Cardiac Panel (last 3 results)  Recent Labs  12/30/16 1210 12/30/16 1604 12/30/16 2222  TROPONINI 1.43* 1.44* 1.01*    PROTIME: Lab Results  Component Value Date   INR 2.65 12/29/2016   INR 1.46 10/11/2016   INR 2.13 09/25/2016    Telemetry:Normal sinus rhythm today.  Personally reviewed   Assessment/Plan:  1.  Acute on chronic systolic heart failure with bilateral pleural effusions-effusions are better  following thoracentesis, still edematous and dyspneic.   2.  Pleural effusion improved following thoracentesis 3.  Advanced gastric cancer currently with no therapeutic options 4.  History of hypercoagulable state  Recommendations:  He is still edematous but output may be increasing some.  We'll hold off on intravenous milrinone in light of his palliative care and advanced cancer.  Primary team can reassess as to whether this is an option but I think if she is going forward will be difficult in terms of management.  Renal function remaining stable.  Overall he thinks he is feeling somewhat better today.  I'm to stop his heparin and place him back on Xarelto.  Increase furosemide 80 mg every 8 hours.     Kerry Hough  MD [facility]  01/01/2017, 8:37 AM

## 2017-01-02 ENCOUNTER — Encounter (HOSPITAL_COMMUNITY): Payer: Medicare Other

## 2017-01-02 DIAGNOSIS — I2102 ST elevation (STEMI) myocardial infarction involving left anterior descending coronary artery: Secondary | ICD-10-CM | POA: Insufficient documentation

## 2017-01-02 DIAGNOSIS — Z515 Encounter for palliative care: Secondary | ICD-10-CM

## 2017-01-02 DIAGNOSIS — I5023 Acute on chronic systolic (congestive) heart failure: Secondary | ICD-10-CM

## 2017-01-02 DIAGNOSIS — Z955 Presence of coronary angioplasty implant and graft: Secondary | ICD-10-CM | POA: Insufficient documentation

## 2017-01-02 DIAGNOSIS — I2690 Septic pulmonary embolism without acute cor pulmonale: Secondary | ICD-10-CM

## 2017-01-02 DIAGNOSIS — J90 Pleural effusion, not elsewhere classified: Secondary | ICD-10-CM

## 2017-01-02 LAB — CBC
HEMATOCRIT: 36.7 % — AB (ref 39.0–52.0)
Hemoglobin: 11.8 g/dL — ABNORMAL LOW (ref 13.0–17.0)
MCH: 27.4 pg (ref 26.0–34.0)
MCHC: 32.2 g/dL (ref 30.0–36.0)
MCV: 85.2 fL (ref 78.0–100.0)
Platelets: 245 10*3/uL (ref 150–400)
RBC: 4.31 MIL/uL (ref 4.22–5.81)
RDW: 17 % — AB (ref 11.5–15.5)
WBC: 16.1 10*3/uL — AB (ref 4.0–10.5)

## 2017-01-02 LAB — RENAL FUNCTION PANEL
Albumin: 3.4 g/dL — ABNORMAL LOW (ref 3.5–5.0)
Anion gap: 16 — ABNORMAL HIGH (ref 5–15)
BUN: 42 mg/dL — AB (ref 6–20)
CALCIUM: 8.9 mg/dL (ref 8.9–10.3)
CO2: 22 mmol/L (ref 22–32)
CREATININE: 1.25 mg/dL — AB (ref 0.61–1.24)
Chloride: 97 mmol/L — ABNORMAL LOW (ref 101–111)
GFR, EST NON AFRICAN AMERICAN: 58 mL/min — AB (ref >60)
Glucose, Bld: 132 mg/dL — ABNORMAL HIGH (ref 65–99)
Phosphorus: 3.9 mg/dL (ref 2.5–4.6)
Potassium: 4 mmol/L (ref 3.5–5.1)
SODIUM: 135 mmol/L (ref 135–145)

## 2017-01-02 LAB — MAGNESIUM: MAGNESIUM: 2 mg/dL (ref 1.7–2.4)

## 2017-01-02 MED ORDER — RIVAROXABAN 20 MG PO TABS: 20.0000 mg | ORAL_TABLET | Freq: Every day | ORAL | Status: DC

## 2017-01-02 MED ORDER — RIVAROXABAN 20 MG PO TABS
20.0000 mg | ORAL_TABLET | Freq: Every day | ORAL | Status: DC
  Administered 2017-01-03: 20 mg via ORAL
  Filled 2017-01-02: qty 1

## 2017-01-02 MED ORDER — SENNOSIDES-DOCUSATE SODIUM 8.6-50 MG PO TABS
2.0000 | ORAL_TABLET | Freq: Two times a day (BID) | ORAL | Status: DC
  Administered 2017-01-02 – 2017-01-03 (×2): 2 via ORAL
  Filled 2017-01-02 (×3): qty 2

## 2017-01-02 MED ORDER — POLYETHYLENE GLYCOL 3350 17 G PO PACK
17.0000 g | PACK | Freq: Two times a day (BID) | ORAL | Status: DC
  Administered 2017-01-02 – 2017-01-03 (×2): 17 g via ORAL
  Filled 2017-01-02 (×2): qty 1

## 2017-01-02 MED ORDER — FUROSEMIDE 80 MG PO TABS
80.0000 mg | ORAL_TABLET | Freq: Two times a day (BID) | ORAL | Status: DC
  Administered 2017-01-02 – 2017-01-03 (×2): 80 mg via ORAL
  Filled 2017-01-02 (×2): qty 1

## 2017-01-02 NOTE — Progress Notes (Addendum)
Palliative Care Follow-up  66 yo with metastatic adenocarcinoma of the Stomach. He completed one round of chemotherapy which he tolerated very poorly and was hospitalized for NSTEMI and CHF. He has a recent history of prior MI with ASCVD and hypercoagulability due to malignancy with multiple strokes-but suprisingly after all of that he maintained his functional status. After extensive discussion with oncology and knowledge of his short prognosis, he had a deep desire to pursue chemotherapy as a last hope. He has stated he does not desire any additional aggressive interventions or chemotherapy but would like to spend his remaining time with his family and be in the best health possible.  ____________________ Much improved today. Energy is better. Appetite improving. Diuresis effective. Family has hope now that cruise may be possible with continued palliative care.   On Friday I started him on Decadron, Mirtazapine, scheduled Tylenol and additional laxative. Additionally I advised that a thoracentesis would be helpful. For symptom management. Given goals this may need to be done again prior to his travel based on clinical exam.  Plan is to discharge him tomorrow. I discussed hospice care with them in detail. They are open to assistance from hospice in meeting his goals.  Will place CM order for Hospice referral. They live in [place]. She is asking about DME.  Will continue to follow. Armandina Gemma Rod DNR has been placed on the chart.  Time: 35 minutes Greater than 50%  of this time was spent counseling and coordinating care related to the above assessment and plan.   Lane Hacker, DO Palliative Medicine [phone]

## 2017-01-02 NOTE — Progress Notes (Signed)
[facility] Progress Note  Patient: [name] OQH:[id number]   PCP: Worthy Rancher, MD DOB: [birth year]   DOA: 12/29/2016   DOS: 01/02/2017   Date of Service: the patient was seen and examined on 01/02/2017  Subjective: Feeling better, breathing is significantly better. No more nausea and tolerating diet. Did not have any bowel movement.  Brief hospital course: Pt. with PMH of metastatic gastric cancer, recent chemotherapy initiation, extensive DVT, recent CVA, recent CAD; admitted on 12/29/2016, presented with complaint of fatigue tiredness, poor appetite and chest pain, was found to have NSTEMI, acute on chronic systolic CHF. Currently further plan is symptom control.  Assessment and Plan: Chest pain,  Elevated troponin, NSTEMI type II or demand ischemia due to acute on chronic CHF recent anterior wall STEMI, status post LAD PCI with DESon 10/11/2016 - cardiology consulted. - Started on IV heparin, echocardiogram shows no significant wall motion abnormalities which is new. Cardiogenic recommended conservative management. Received 48 hours of IV heparin, Transitioning back to Xarelto, Heparin infusion stopped. - pt already on statin and coreg, Plavix  Acute on Chronic combined systolic and diastolic CHF Acute hypoxic respiratory failure. No evidence of active pneumonia. No evidence of sepsis. - Echo 02/21/2018and 10/17/2016 with EF 54-65%, grade 2 diastolic CHF - Monitor daily weights, strict I/O Started on IV Lasix 60 mg every 8 hours. Increasing to 80 mg every 8 hours Highly appreciate cardiology input. Underwent right and left thoracentesis for therapeutic purposes.  Fluid analysis suggest transudative consistent with CHF.  Incentive spirometry ordered. Educated patient regarding how to do it. With mild worsening renal function backing off on Lasix to 80 mg twice a day. Likely be able to transition to oral Lasix tomorrow and discharge. We will await cardiology  recommendation.  Metastatic, poorly differentiated gastric adenocarcinoma Goals of care discussion. - With multiple liver metastases, likely lung metastases, extensive abdominal adenopathy and stomach mass - Per recent PET scan 10/10/2016 lymph node involvement consistent with progressive metastatic illness - It was determined the patient is not a surgical candidate, patient was started on palliative chemotherapy but cannot tolerate it. Oncology recommended that the patient is not a good candidate for any additional palliative therapy and recommended transition to hospice. Palliative care consulted,  primary focus is symptom control and treat what is treatable with reasonable degree. No aggressive or heroic measures. We will await recommendation from palliative care for discharge planning.  Extensive bilateral DVT, PE 09/10/2016 - In patient with likely hypercoagulable state likely related to metastatic differentiated gastric adenocarcinoma - On Xarelto, was on hold, now since the heparin drip has been completed with transition to Xarelto again. Complicated history, patient has been on Xarelto in the past and completed 21 day loading but then transitioned to Lovenox. Recently one week ago started back on Xarelto with loading. Given that the patient has remained consistently anticoagulated since diagnosis of DVT in January 2018 it was felt that the patient does not need any more loading of Xarelto and can be transitioned directly to 20 mg daily dose. This was discussed with hematology on call who agrees with the plan. recent antiphospholipid antibody workup negative.  Hyponatremia Getting better. Continue to monitor.  Transaminitis - Likely from liver metastases  Diet: Cardiac diet DVT Prophylaxis: on therapeutic anticoagulation.  Advance goals of care discussion: DNR DNI  Family Communication: family was present at bedside, at the time of interview. The pt provided permission to  discuss medical plan with the family. Opportunity was given to ask  question and all questions were answered satisfactorily.   Disposition:  Discharge to home.  Consultants: cardiology  Palliative care Procedures: right and left thoracentesis.  Echocardiogram  Antibiotics: Anti-infectives    None       Objective: Physical Exam: Vitals:   01/02/17 0054 01/02/17 0605 01/02/17 0830 01/02/17 1313  BP:  105/61 108/62 94/61  Pulse:  79 84 77  Resp:      Temp: 97.7 F (36.5 C) 97.8 F (36.6 C) 97.3 F (36.3 C) 97.3 F (36.3 C)  TempSrc: Oral Oral Oral Oral  SpO2:  92% 99% 98%  Weight:  99.2 kg (218 lb 12.8 oz)    Height:        Intake/Output Summary (Last 24 hours) at 01/02/17 1532 Last data filed at 01/02/17 0615  Gross per 24 hour  Intake                0 ml  Output              500 ml  Net             -500 ml   Filed Weights   12/31/16 0500 01/01/17 0415 01/02/17 0605  Weight: 104.1 kg (229 lb 8 oz) 99.9 kg (220 lb 3.2 oz) 99.2 kg (218 lb 12.8 oz)   General: Alert, Awake and Oriented to Time, Place and Person. Appear in moderate distress, affect appropriate Eyes: PERRL, Conjunctiva normal ENT: Oral Mucosa clear moist. Neck: no JVD, no Abnormal Mass Or lumps Cardiovascular: S1 and S2 Present, no Murmur, Respiratory: Bilateral Air entry equal and Decreased, no use of accessory muscle, bilateral Crackles, no wheezes Abdomen: Bowel Sound present, Soft and no tenderness Skin: no redness, no Rash, no induration Extremities: trace Pedal edema, no calf tenderness Neurologic: Grossly no focal neuro deficit. Bilaterally Equal motor strength  Data Reviewed: CBC:  Recent Labs Lab 12/29/16 1006 12/29/16 1048 12/30/16 0253 12/31/16 0412 01/01/17 0446 01/02/17 0645  WBC 8.8  --  9.5 6.0 11.3* 16.1*  NEUTROABS 6.3  --   --   --   --   --   HGB 11.3* 11.9* 11.1* 10.7* 11.0* 11.8*  HCT 34.4* 35.0* 34.8* 33.9* 34.0* 36.7*  MCV 81.9  --  83.1 82.9 84.6 85.2  PLT 215  --   220 225 234 703   Basic Metabolic Panel:  Recent Labs Lab 12/29/16 1006 12/29/16 1048 12/30/16 0253 12/31/16 0739 01/01/17 0446 01/02/17 0645  NA 128* 128* 128* 130* 132* 135  K 4.8 4.7 4.7 4.6 4.3 4.0  CL 94* 95* 95* 94* 96* 97*  CO2 22  --  24 24 25 22   GLUCOSE 155* 145* 138* 171* 153* 132*  BUN 33* 30* 32* 36* 36* 42*  CREATININE 1.10 1.10 1.35* 1.18 1.19 1.25*  CALCIUM 9.4  --  9.1 9.3 9.1 8.9  MG  --   --   --  2.3 2.2 2.0  PHOS  --   --   --  4.9* 3.7 3.9    Liver Function Tests:  Recent Labs Lab 12/29/16 1006 12/31/16 0739 01/01/17 0446 01/02/17 0645  AST 72*  --   --   --   ALT 68*  --   --   --   ALKPHOS 126  --   --   --   BILITOT 1.1  --   --   --   PROT 6.6  --   --   --   ALBUMIN 3.6 3.2* 3.1*  3.4*    Recent Labs Lab 12/29/16 1006  LIPASE 22   No results for input(s): AMMONIA in the last 168 hours. Coagulation Profile:  Recent Labs Lab 12/29/16 1006  INR 2.65   Cardiac Enzymes:  Recent Labs Lab 12/29/16 1042 12/30/16 1210 12/30/16 1604 12/30/16 2222  TROPONINI 1.70* 1.43* 1.44* 1.01*   BNP (last 3 results) No results for input(s): PROBNP in the last 8760 hours. CBG:  Recent Labs Lab 12/29/16 1344  GLUCAP 124*   Studies: No results found.  Scheduled Meds: . acetaminophen  1,000 mg Oral TID  . aspirin EC  81 mg Oral Daily  . atorvastatin  80 mg Oral QPM  . carvedilol  6.25 mg Oral BID  . clopidogrel  75 mg Oral Daily  . dexamethasone  4 mg Oral Q breakfast  . furosemide  80 mg Oral BID  . mirtazapine  15 mg Oral QHS  . pantoprazole  40 mg Oral QHS  . polyethylene glycol  17 g Oral BID  . [START ON 01/03/2017] rivaroxaban  20 mg Oral Q lunch   Followed by  . [START ON 01/04/2017] rivaroxaban  20 mg Oral Q supper  . senna-docusate  2 tablet Oral BID   Continuous Infusions: PRN Meds: lidocaine, nitroGLYCERIN, ondansetron (ZOFRAN) IV, traMADol  Time spent: 35 minutes  Author: Berle Mull, MD [facility] Pager: [phone] 01/02/2017 3:32 PM  If 7PM-7AM, please contact night-coverage at [email or web address], password [facility]

## 2017-01-02 NOTE — Care Management Important Message (Signed)
Important Message  Patient Details  Name: [name] MRN: [id number] Date of Birth: 1950/12/30   Medicare Important Message Given:  Yes    Nathen May 01/02/2017, 1:53 PM

## 2017-01-02 NOTE — Evaluation (Signed)
Physical Therapy Evaluation Patient Details Name: [name] MRN: [id number] DOB: [birth year] Today's Date: 01/02/2017   History of Present Illness  Pt is 66 yo male with PMH of metastatic gastric cancer, recent chemotherapy initiation, extensive DVT, recent CVA, recent CAD; admitted on 12/29/2016, presented with complaint of fatigue tiredness, poor appetite and chest pain, was found to have NSTEMI, acute on chronic systolic CHF.  Clinical Impression  Pt admitted with above diagnosis. Pt currently with functional limitations due to the deficits listed below (see PT Problem List). Pt able to tolerate bouts of ambulation 60' with RW and min-guard A and then needed to have seated rest before being able to continue. Pt becomes SOB and light headed and pale, though vitals remained 99% and HR in low 90's. Will recommend HHPT to improve strength to the point that he can hopefully participate in cruise in late May with his wife.  Pt will benefit from skilled PT to increase their independence and safety with mobility to allow discharge to the venue listed below.       Follow Up Recommendations Home health PT    Equipment Recommendations  None recommended by PT    Recommendations for Other Services       Precautions / Restrictions Precautions Precautions: Fall Restrictions Weight Bearing Restrictions: No      Mobility  Bed Mobility Overal bed mobility: Modified Independent             General bed mobility comments: increased effort and WOB but no physical assist needed  Transfers Overall transfer level: Needs assistance Equipment used: Rolling walker (2 wheeled) Transfers: Sit to/from Stand Sit to Stand: Min guard         General transfer comment: vc's for hand placement. pt fatigues very quickly, min-guard A for safety  Ambulation/Gait Ambulation/Gait assistance: Min guard Ambulation Distance (Feet): 50 Feet (2x) Assistive device: Rolling walker (2 wheeled) Gait  Pattern/deviations: Step-through pattern;Decreased stride length Gait velocity: decreased Gait velocity interpretation: <1.8 ft/sec, indicative of risk for recurrent falls General Gait Details: low step height. When pt fatigues, he becomes very pale and needs to sit and rest. O2 sats 99%, HR 94 bpm. Could not tolerate more than 60' at a time without a seated rest break  Stairs            Wheelchair Mobility    Modified Rankin (Stroke Patients Only)       Balance Overall balance assessment: Needs assistance Sitting-balance support: No upper extremity supported Sitting balance-Leahy Scale: Good     Standing balance support: No upper extremity supported Standing balance-Leahy Scale: Fair Standing balance comment: unsteady without UE support                             Pertinent Vitals/Pain Pain Assessment: Faces Faces Pain Scale: Hurts little more Pain Location: abdomen Pain Descriptors / Indicators: Tightness Pain Intervention(s): Limited activity within patient's tolerance;Monitored during session    Home Living Family/patient expects to be discharged to:: Private residence Living Arrangements: Spouse/significant other Available Help at Discharge: Family;Available 24 hours/day Type of Home: House Home Access: Level entry     Home Layout: Able to live on main level with bedroom/bathroom;Two level Home Equipment: Grab bars - tub/shower;Walker - 4 wheels      Prior Function Level of Independence: Needs assistance   Gait / Transfers Assistance Needed: had stopped using his RW but got it back out last Thursday when he was feeling  very weak.   ADL's / Homemaking Assistance Needed: Performs his own ADLs and assists with homemaking tasks.  Wife pays bills and cooks.          Hand Dominance   Dominant Hand: Right    Extremity/Trunk Assessment   Upper Extremity Assessment Upper Extremity Assessment: Generalized weakness    Lower Extremity  Assessment Lower Extremity Assessment: Generalized weakness    Cervical / Trunk Assessment Cervical / Trunk Assessment: Normal  Communication   Communication: Expressive difficulties  Cognition Arousal/Alertness: Awake/alert Behavior During Therapy: WFL for tasks assessed/performed Overall Cognitive Status: History of cognitive impairments - at baseline                                        General Comments      Exercises General Exercises - Lower Extremity Hip Flexion/Marching: Both;5 reps;Standing Mini-Sqauts: 5 reps;Standing   Assessment/Plan    PT Assessment Patient needs continued PT services  PT Problem List Decreased strength;Decreased activity tolerance;Decreased balance;Decreased mobility;Decreased knowledge of precautions;Pain       PT Treatment Interventions DME instruction;Gait training;Stair training;Functional mobility training;Therapeutic activities;Therapeutic exercise;Balance training;Patient/family education    PT Goals (Current goals can be found in the Care Plan section)  Acute Rehab PT Goals Patient Stated Goal: go on cruise with wife May 27 PT Goal Formulation: With patient Time For Goal Achievement: 01/16/17 Potential to Achieve Goals: Fair    Frequency Min 3X/week   Barriers to discharge        Co-evaluation               AM-PAC PT "6 Clicks" Daily Activity  Outcome Measure Difficulty turning over in bed (including adjusting bedclothes, sheets and blankets)?: A Little Difficulty moving from lying on back to sitting on the side of the bed? : A Little Difficulty sitting down on and standing up from a chair with arms (e.g., wheelchair, bedside commode, etc,.)?: Total Help needed moving to and from a bed to chair (including a wheelchair)?: A Little Help needed walking in hospital room?: A Little Help needed climbing 3-5 steps with a railing? : A Lot 6 Click Score: 15    End of Session Equipment Utilized During  Treatment: Gait belt Activity Tolerance: Patient limited by fatigue Patient left: in chair;with call bell/phone within reach Nurse Communication: Mobility status PT Visit Diagnosis: Unsteadiness on feet (R26.81);Muscle weakness (generalized) (M62.81);Pain;Difficulty in walking, not elsewhere classified (R26.2) Pain - part of body:  (abdomen)    Time: 5615-3794 PT Time Calculation (min) (ACUTE ONLY): 45 min   Charges:   PT Evaluation $PT Eval Moderate Complexity: 1 Procedure PT Treatments $Gait Training: 8-22 mins $Therapeutic Activity: 8-22 mins   PT G Codes:        Leighton Roach, PT  Acute Rehab Services  [employer] 01/02/2017, 12:05 PM

## 2017-01-02 NOTE — Progress Notes (Signed)
Progress Note  Patient Name: [name] Date of Encounter: 01/02/2017  Primary Cardiologist: Dr. Tamala Julian  Subjective   Feeling short of breath with ambulation. Denies orthopnea or PND.    Inpatient Medications    Scheduled Meds: . acetaminophen  1,000 mg Oral TID  . aspirin EC  81 mg Oral Daily  . atorvastatin  80 mg Oral QPM  . carvedilol  6.25 mg Oral BID  . clopidogrel  75 mg Oral Daily  . dexamethasone  4 mg Oral Q breakfast  . furosemide  80 mg Intravenous Q8H  . mirtazapine  15 mg Oral QHS  . pantoprazole  40 mg Oral QHS  . polyethylene glycol  17 g Oral BID  . rivaroxaban  20 mg Oral Daily  . senna-docusate  2 tablet Oral QHS   Continuous Infusions:  PRN Meds: lidocaine, nitroGLYCERIN, ondansetron (ZOFRAN) IV, traMADol   Vital Signs    Vitals:   01/01/17 2145 01/02/17 0054 01/02/17 0605 01/02/17 0830  BP: 99/69  105/61 108/62  Pulse: 80  79 84  Resp:      Temp: 97.5 F (36.4 C) 97.7 F (36.5 C) 97.8 F (36.6 C) 97.3 F (36.3 C)  TempSrc: Oral Oral Oral Oral  SpO2: 98%  92% 99%  Weight:   99.2 kg (218 lb 12.8 oz)   Height:        Intake/Output Summary (Last 24 hours) at 01/02/17 1017 Last data filed at 01/02/17 0615  Gross per 24 hour  Intake              240 ml  Output             1200 ml  Net             -960 ml   Filed Weights   12/31/16 0500 01/01/17 0415 01/02/17 0605  Weight: 104.1 kg (229 lb 8 oz) 99.9 kg (220 lb 3.2 oz) 99.2 kg (218 lb 12.8 oz)    Telemetry    Sinus rhythm.  PVCs, PACs. - Personally Reviewed  ECG    n/a - Personally Reviewed  Physical Exam   GEN: Well-appearing.  Lying flat in bed comfortably.   Neck: No JVD Cardiac: RRR, no murmurs, rubs, or gallops.  Respiratory: Diminished at L base.  Crackles at right base.   GI: Soft, Mild RUQ TTP. non-distended  MS: 1+ pitting edema to ankles; No deformity. Neuro:  Nonfocal  Psych: Normal affect   Labs    Chemistry Recent Labs Lab 12/29/16 1006   12/31/16 0739 01/01/17 0446 01/02/17 0645  NA 128*  < > 130* 132* 135  K 4.8  < > 4.6 4.3 4.0  CL 94*  < > 94* 96* 97*  CO2 22  < > 24 25 22   GLUCOSE 155*  < > 171* 153* 132*  BUN 33*  < > 36* 36* 42*  CREATININE 1.10  < > 1.18 1.19 1.25*  CALCIUM 9.4  < > 9.3 9.1 8.9  PROT 6.6  --   --   --   --   ALBUMIN 3.6  --  3.2* 3.1* 3.4*  AST 72*  --   --   --   --   ALT 68*  --   --   --   --   ALKPHOS 126  --   --   --   --   BILITOT 1.1  --   --   --   --  GFRNONAA >60  < > >60 >60 58*  GFRAA >60  < > >60 >60 >60  ANIONGAP 12  < > 12 11 16*  < > = values in this interval not displayed.   Hematology Recent Labs Lab 12/31/16 0412 01/01/17 0446 01/02/17 0645  WBC 6.0 11.3* 16.1*  RBC 4.09* 4.02* 4.31  HGB 10.7* 11.0* 11.8*  HCT 33.9* 34.0* 36.7*  MCV 82.9 84.6 85.2  MCH 26.2 27.4 27.4  MCHC 31.6 32.4 32.2  RDW 15.0 15.7* 17.0*  PLT 225 234 245    Cardiac Enzymes Recent Labs Lab 12/29/16 1042 12/30/16 1210 12/30/16 1604 12/30/16 2222  TROPONINI 1.70* 1.43* 1.44* 1.01*    Recent Labs Lab 12/29/16 1032  TROPIPOC 1.11*     BNP Recent Labs Lab 12/29/16 1006  BNP 1,472.0*     DDimer No results for input(s): DDIMER in the last 168 hours.   Radiology    Dg Chest 1 View  Result Date: 12/31/2016 CLINICAL DATA:  66 year old male status post left thoracentesis EXAM: CHEST 1 VIEW COMPARISON:  Pre thoracentesis chest x-ray 12/30/2016 FINDINGS: No evidence of a pneumothorax. Significant interval improvement in left pleural effusion. Persistent mild bibasilar atelectasis. Stable cardiomegaly. No acute osseous abnormality. IMPRESSION: No evidence of pneumothorax following left-sided thoracentesis. Electronically Signed   By: Jacqulynn Cadet M.D.   On: 12/31/2016 13:03   US Thoracentesis Asp Pleural Space W/img Guide  Result Date: 12/31/2016 INDICATION: Symptomatic left sided pleural effusion EXAM: US THORACENTESIS ASP PLEURAL SPACE W/IMG GUIDE COMPARISON:  None  MEDICATIONS: 10 cc 1% lidocaine. COMPLICATIONS: None immediate. TECHNIQUE: Informed written consent was obtained from the patient after a discussion of the risks, benefits and alternatives to treatment. A timeout was performed prior to the initiation of the procedure. Initial ultrasound scanning demonstrates a left pleural effusion. The lower chest was prepped and draped in the usual sterile fashion. 1% lidocaine was used for local anesthesia. Under direct ultrasound guidance, a 19 gauge, 7-cm, Yueh catheter was introduced. An ultrasound image was saved for documentation purposes. The thoracentesis was performed. The catheter was removed and a dressing was applied. The patient tolerated the procedure well without immediate post procedural complication. The patient was escorted to have an upright chest radiograph. FINDINGS: A total of approximately 700 cc of yellow fluid was removed. Requested samples were sent to the laboratory. IMPRESSION: Successful ultrasound-guided left sided thoracentesis yielding 700 cc of pleural fluid. Read by Lavonia Drafts [facility] Electronically Signed   By: Jacqulynn Cadet M.D.   On: 12/31/2016 12:53    Cardiac Studies   Echo 5/1//18: Study Conclusions  - Left ventricle: LVEF is approximately 25 to 30% with diffuse   hypokinesis, apical akinesis. Janeal Holmes basal/mid lateral, basal   anterior wall move well. Compared to echo images from Feb 2018,   LVEF appears mildly worse. The cavity size was mildly dilated.   Wall thickness was normal. Doppler parameters are consistent with   high ventricular filling pressure. - Aortic valve: There was moderate regurgitation. - Mitral valve: There was mild regurgitation. - Left atrium: The atrium was mildly dilated. - Right ventricle: The cavity size was mildly dilated. Systolic   function was mildly to moderately reduced. - Right atrium: The atrium was mildly dilated. - Pulmonary arteries: PA peak pressure: 52 mm Hg (S). -  Pericardium, extracardiac: A trivial pericardial effusion was   identified.  Patient Profile     66 y.o. male with metastatic gastric cancer, CAD s/p STEMI and LAD PCI,  here with acute o chronic systolic and diastolic heart failure.  LVEF 25-30%   Assessment & Plan    # Acute on chronic systolic and diastolic heart failure:  Volume status improving.  He continues to have crackles and mild edema.  Renal function starting to rise.  We will transition to lasix 80 mg oral BID.  Continue carvedilol.  # CAD: No chest pain.  He is s/p LAD PCI for STEMI 09/2016.  Continue aspirin, carvedilol and atorvastatin.  # s/p CVA: Continue aspirin  # DVT/PE: Continue Xarelto.  Monitor renal function closely.  Signed, Skeet Latch, MD  01/02/2017, 10:17 AM

## 2017-01-02 NOTE — Discharge Instructions (Signed)

## 2017-01-03 LAB — CBC
HEMATOCRIT: 36.6 % — AB (ref 39.0–52.0)
Hemoglobin: 11.6 g/dL — ABNORMAL LOW (ref 13.0–17.0)
MCH: 26.9 pg (ref 26.0–34.0)
MCHC: 31.7 g/dL (ref 30.0–36.0)
MCV: 84.9 fL (ref 78.0–100.0)
PLATELETS: 266 10*3/uL (ref 150–400)
RBC: 4.31 MIL/uL (ref 4.22–5.81)
RDW: 17.1 % — AB (ref 11.5–15.5)
WBC: 15.3 10*3/uL — AB (ref 4.0–10.5)

## 2017-01-03 LAB — RENAL FUNCTION PANEL
ALBUMIN: 3.3 g/dL — AB (ref 3.5–5.0)
Anion gap: 13 (ref 5–15)
BUN: 47 mg/dL — AB (ref 6–20)
CO2: 24 mmol/L (ref 22–32)
Calcium: 9.1 mg/dL (ref 8.9–10.3)
Chloride: 97 mmol/L — ABNORMAL LOW (ref 101–111)
Creatinine, Ser: 1.25 mg/dL — ABNORMAL HIGH (ref 0.61–1.24)
GFR, EST NON AFRICAN AMERICAN: 58 mL/min — AB (ref >60)
Glucose, Bld: 140 mg/dL — ABNORMAL HIGH (ref 65–99)
PHOSPHORUS: 3.6 mg/dL (ref 2.5–4.6)
Potassium: 4.2 mmol/L (ref 3.5–5.1)
Sodium: 134 mmol/L — ABNORMAL LOW (ref 135–145)

## 2017-01-03 LAB — MAGNESIUM: MAGNESIUM: 2.3 mg/dL (ref 1.7–2.4)

## 2017-01-03 LAB — CULTURE, BLOOD (ROUTINE X 2)
CULTURE: NO GROWTH
CULTURE: NO GROWTH
Special Requests: ADEQUATE
Special Requests: ADEQUATE

## 2017-01-03 MED ORDER — ESCITALOPRAM OXALATE 20 MG PO TABS: 20.0000 mg | ORAL_TABLET | Freq: Every day | ORAL | 0 refills | Status: AC

## 2017-01-03 MED ORDER — FUROSEMIDE 40 MG PO TABS: 80.0000 mg | ORAL_TABLET | Freq: Two times a day (BID) | ORAL | 0 refills | Status: AC

## 2017-01-03 MED ORDER — CARVEDILOL 6.25 MG PO TABS: 6.2500 mg | ORAL_TABLET | Freq: Two times a day (BID) | ORAL | 3 refills | Status: AC

## 2017-01-03 MED ORDER — NITROGLYCERIN 0.4 MG SL SUBL: 0.4000 mg | SUBLINGUAL_TABLET | SUBLINGUAL | 0 refills | Status: AC | PRN

## 2017-01-03 MED ORDER — RIVAROXABAN 20 MG PO TABS: 20.0000 mg | ORAL_TABLET | Freq: Every day | ORAL | 0 refills | Status: AC

## 2017-01-03 MED ORDER — RIVAROXABAN 20 MG PO TABS: 20.0000 mg | ORAL_TABLET | Freq: Every day | ORAL | 0 refills | Status: DC

## 2017-01-03 MED ORDER — OMEPRAZOLE 40 MG PO CPDR: 40.0000 mg | DELAYED_RELEASE_CAPSULE | Freq: Every day | ORAL | 0 refills | Status: AC

## 2017-01-03 MED ORDER — TRAMADOL HCL 50 MG PO TABS: 50.0000 mg | ORAL_TABLET | Freq: Four times a day (QID) | ORAL | 0 refills | Status: AC | PRN

## 2017-01-03 MED ORDER — ATORVASTATIN CALCIUM 80 MG PO TABS: 80.0000 mg | ORAL_TABLET | Freq: Every evening | ORAL | 0 refills | Status: AC

## 2017-01-03 MED ORDER — CLOPIDOGREL BISULFATE 75 MG PO TABS: 75.0000 mg | ORAL_TABLET | Freq: Every day | ORAL | 0 refills | Status: AC

## 2017-01-03 MED ORDER — POLYETHYLENE GLYCOL 3350 17 G PO PACK: 17.0000 g | PACK | Freq: Two times a day (BID) | ORAL | 0 refills | Status: DC

## 2017-01-03 MED ORDER — DEXAMETHASONE 4 MG PO TABS: 4.0000 mg | ORAL_TABLET | Freq: Every day | ORAL | 0 refills | Status: AC

## 2017-01-03 MED ORDER — DEXAMETHASONE 4 MG PO TABS: 4.0000 mg | ORAL_TABLET | Freq: Every day | ORAL | 0 refills | Status: DC

## 2017-01-03 MED ORDER — MIRTAZAPINE 15 MG PO TBDP: 15.0000 mg | ORAL_TABLET | Freq: Every day | ORAL | 0 refills | Status: AC

## 2017-01-03 MED ORDER — SENNOSIDES-DOCUSATE SODIUM 8.6-50 MG PO TABS: 2.0000 | ORAL_TABLET | Freq: Every day | ORAL | 1 refills | Status: AC

## 2017-01-03 MED ORDER — MIRTAZAPINE 15 MG PO TBDP: 15.0000 mg | ORAL_TABLET | Freq: Every day | ORAL | 0 refills | Status: DC

## 2017-01-03 MED ORDER — ONDANSETRON HCL 4 MG PO TABS: 4.0000 mg | ORAL_TABLET | Freq: Three times a day (TID) | ORAL | 0 refills | Status: AC | PRN

## 2017-01-03 MED ORDER — POLYETHYLENE GLYCOL 3350 17 G PO PACK: 17.0000 g | PACK | Freq: Two times a day (BID) | ORAL | 0 refills | Status: AC

## 2017-01-03 NOTE — Care Management Note (Addendum)
Case Management Note  Patient Details  Name: [name] MRN: [id number] Date of Birth: [birth year]  Subjective/Objective:  Pt presented with fatigue, poor po intake and chest pain. CHF with IV Lasix. Gastric Cancer- Palliative consulted and following. Pt is from home with wife and plans to return home with Hospice Services. Pt has DME RW with [facility]. CM did discuss DME needs: wife felt like pt would benefit from [facility], 3n1 and to have Hospice evaluate for need of Hospital Bed and 02. Pt on room air at this time.                    Action/Plan: Agency List provided and pt/ wife chose [facility] Referral made and SOC to begin at 2:00 pm today for RN to do initial assessment. Pt will need written Rx's to take home and Hospice can make family aware to which ones they will pay for. Hospice will discuss DME needs as well. Pt will be transported home via car. No further needs from CM at this time.   Expected Discharge Date:                  Expected Discharge Plan:  Home w Hospice Care  In-House Referral:  Hospice / Palliative Care  Discharge planning Services  CM Consult  Post Acute Care Choice:  Hospice Choice offered to:  Patient, Spouse  DME Arranged:  N/A DME Agency:  NA  HH Arranged:  RN [place] Agency:  [facility]  Status of Service:  Completed, signed off  If discussed at [employer] of Stay Meetings, dates discussed:    Additional Comments:  Bethena Roys, RN 01/03/2017, 11:33 AM

## 2017-01-03 NOTE — Progress Notes (Signed)
PT Cancellation Note  Patient Details Name: [name] MRN: [id number] DOB: 09-16-1950   Cancelled Treatment:    Reason Eval/Treat Not Completed: Other (comment). Pt eating breakfast earlier and then opted to save energy for trip home later today.  [place] 01/03/2017, 11:40 AM  [place]

## 2017-01-03 NOTE — Progress Notes (Signed)
Progress Note  Patient Name: [name] Date of Encounter: 01/03/2017  Primary Cardiologist: Dr. Tamala Julian  Subjective   Feeling better.  Denies dyspnea with ambulation.  Inpatient Medications    Scheduled Meds: . acetaminophen  1,000 mg Oral TID  . aspirin EC  81 mg Oral Daily  . atorvastatin  80 mg Oral QPM  . carvedilol  6.25 mg Oral BID  . clopidogrel  75 mg Oral Daily  . dexamethasone  4 mg Oral Q breakfast  . furosemide  80 mg Oral BID  . mirtazapine  15 mg Oral QHS  . pantoprazole  40 mg Oral QHS  . polyethylene glycol  17 g Oral BID  . rivaroxaban  20 mg Oral Q breakfast  . senna-docusate  2 tablet Oral BID   Continuous Infusions:  PRN Meds: nitroGLYCERIN, ondansetron (ZOFRAN) IV, traMADol   Vital Signs    Vitals:   01/02/17 1313 01/02/17 1648 01/02/17 1900 01/03/17 0300  BP: 94/61 106/66 103/71 111/70  Pulse: 77 83 83 90  Resp:   16 16  Temp: 97.3 F (36.3 C) 97.8 F (36.6 C) 97.8 F (36.6 C) 98 F (36.7 C)  TempSrc: Oral Oral Oral Oral  SpO2: 98%  98% 95%  Weight:    97.6 kg (215 lb 3.2 oz)  Height:        Intake/Output Summary (Last 24 hours) at 01/03/17 0832 Last data filed at 01/03/17 0700  Gross per 24 hour  Intake              712 ml  Output              250 ml  Net              462 ml   Filed Weights   01/01/17 0415 01/02/17 0605 01/03/17 0300  Weight: 99.9 kg (220 lb 3.2 oz) 99.2 kg (218 lb 12.8 oz) 97.6 kg (215 lb 3.2 oz)    Telemetry    Sinus rhythm.  PVCs, PACs. - Personally Reviewed  ECG    n/a - Personally Reviewed  Physical Exam   GEN: Well-appearing.  No acute distress Neck: No JVD sitting upright Cardiac: RRR, II/IV diastolic murmur at the LUSB.  No rubs, or gallops.  Respiratory: Diminished at L base.  Crackles at right base.   GI: Soft, Mild RUQ TTP. non-distended  XQ:JJHER edema to ankles; No deformity. Neuro:  Nonfocal  Psych: Normal affect   Labs    Chemistry Recent Labs Lab 12/29/16 1006   01/01/17 0446 01/02/17 0645 01/03/17 0438  NA 128*  < > 132* 135 134*  K 4.8  < > 4.3 4.0 4.2  CL 94*  < > 96* 97* 97*  CO2 22  < > 25 22 24   GLUCOSE 155*  < > 153* 132* 140*  BUN 33*  < > 36* 42* 47*  CREATININE 1.10  < > 1.19 1.25* 1.25*  CALCIUM 9.4  < > 9.1 8.9 9.1  PROT 6.6  --   --   --   --   ALBUMIN 3.6  < > 3.1* 3.4* 3.3*  AST 72*  --   --   --   --   ALT 68*  --   --   --   --   ALKPHOS 126  --   --   --   --   BILITOT 1.1  --   --   --   --   GFRNONAA >  60  < > >60 58* 58*  GFRAA >60  < > >60 >60 >60  ANIONGAP 12  < > 11 16* 13  < > = values in this interval not displayed.   Hematology  Recent Labs Lab 01/01/17 0446 01/02/17 0645 01/03/17 0438  WBC 11.3* 16.1* 15.3*  RBC 4.02* 4.31 4.31  HGB 11.0* 11.8* 11.6*  HCT 34.0* 36.7* 36.6*  MCV 84.6 85.2 84.9  MCH 27.4 27.4 26.9  MCHC 32.4 32.2 31.7  RDW 15.7* 17.0* 17.1*  PLT 234 245 266    Cardiac Enzymes  Recent Labs Lab 12/29/16 1042 12/30/16 1210 12/30/16 1604 12/30/16 2222  TROPONINI 1.70* 1.43* 1.44* 1.01*     Recent Labs Lab 12/29/16 1032  TROPIPOC 1.11*     BNP  Recent Labs Lab 12/29/16 1006  BNP 1,472.0*     DDimer No results for input(s): DDIMER in the last 168 hours.   Radiology    No results found.  Cardiac Studies   Echo 5/1//18: Study Conclusions  - Left ventricle: LVEF is approximately 25 to 30% with diffuse   hypokinesis, apical akinesis. Janeal Holmes basal/mid lateral, basal   anterior wall move well. Compared to echo images from Feb 2018,   LVEF appears mildly worse. The cavity size was mildly dilated.   Wall thickness was normal. Doppler parameters are consistent with   high ventricular filling pressure. - Aortic valve: There was moderate regurgitation. - Mitral valve: There was mild regurgitation. - Left atrium: The atrium was mildly dilated. - Right ventricle: The cavity size was mildly dilated. Systolic   function was mildly to moderately reduced. - Right  atrium: The atrium was mildly dilated. - Pulmonary arteries: PA peak pressure: 52 mm Hg (S). - Pericardium, extracardiac: A trivial pericardial effusion was   identified.  Patient Profile     66 y.o. male with metastatic gastric cancer, CAD s/p STEMI and LAD PCI, here with acute o chronic systolic and diastolic heart failure.  LVEF 25-30%   Assessment & Plan    # Acute on chronic systolic and diastolic heart failure:  Volume status Continues to improve. He still has faint crackles at the right base.  He was switched to oral lasix yesterday and renal function is stable.  No outs were recorded yesterday but his weight decreased from 99 to 97.6kg.  We will reduce lasix to 80mg  po daily. Continue carvedilol.  Consider adding an ARB/ARNI as an outpatient if renal function stabilizes.  # CAD: No chest pain.  He is s/p LAD PCI for STEMI 09/2016.  Continue aspirin, carvedilol and atorvastatin.  # s/p CVA: Continue aspirin  # DVT/PE: Continue Xarelto.  Monitor renal function closely.  OK for discharge from a cardiology standpoint.  We will arrange follow up within one week.  Signed, Skeet Latch, MD  01/03/2017, 8:32 AM

## 2017-01-04 ENCOUNTER — Encounter (HOSPITAL_COMMUNITY): Payer: Medicare Other

## 2017-01-04 LAB — CULTURE, BODY FLUID W GRAM STAIN -BOTTLE: Culture: NO GROWTH

## 2017-01-04 LAB — CULTURE, BODY FLUID-BOTTLE

## 2017-01-04 NOTE — Discharge Summary (Signed)
[facility] Discharge Summary   Patient: [name] ACZ:[id number]   PCP: Dettinger, Fransisca Kaufmann, MD DOB: [birth year]   Date of admission: 12/29/2016   Date of discharge: 01/03/2017     Discharge Diagnoses:  Active Problems:   Elevated troponin   Bilateral pleural effusion   Palliative care by specialist   Admitted From: home Disposition:  Home with hospice   Recommendations for Outpatient Follow-up:  1. Please establish care with hospice.   Follow-up Information    Dettinger, Fransisca Kaufmann, MD. Schedule an appointment as soon as possible for a visit in 1 week(s).   Specialties:  Family Medicine, Cardiology Contact information: [place] [address] [place], [place] Follow up.   Why:  Registered Nurse.  Contact information: [address] [place] [address] [phone]          Diet recommendation: cardiac diet  Activity: The patient is advised to gradually reintroduce usual activities.  Discharge Condition: good  Code Status: DNR DNI  History of present illness: As per the H and P dictated on admission, "[name] is a 66 y.o. male with known metastatic gastric cancer, follows with Dr. [name], last chemotherapy 7 days prior to this admission,  multiple bilateral lower extremity DVT and bilateral large volume PE 09/10/2016 on Xarelto, extensive embolic CVAs, CAD, status post acute STEMI status post stenting 10/11/2016 (DES stenting of his mid LAD with a Synergy stent), abdominal lymphadenopathy that required PET scan 10/10/2016 for further evaluation and was notable for hypermetabolic gastrohepatic ligament LN, gastrosplenic LN, porta hepatis LN, consistent with metastatic lymph nodes.  Patient presented to [facility] emergency department with main concern of almost 7 days duration of progressively worsening weakness, fatigue, mostly exertional dyspnea and intermittent episodes of chest discomfort. Patient explains  that he has noted mid area chest discomfort getting worse over the past several days, pressure-like, occasionally but not consistently radiating to bilateral arms, associated with dyspnea, lethargy, confusion, poor oral intake. Patient reports movement makes things worse and pain improves with rest. Patient denies fevers and chills, no specific urinary concerns. Patient reports he had 2 episodes of nonbloody vomiting yesterday. Per wife, he has not been eating anything for over 3 days."  Hospital Course:  Summary of his active problems in the hospital is as following. Chest pain,  Elevated troponin, NSTEMI type II or demand ischemia due to acute on chronic CHF recent anterior wall STEMI, status post LAD PCI with DESon 10/11/2016 - cardiology consulted. - Started on IV heparin, echocardiogram shows no significant wall motion abnormalities which is new. Cardiogenic recommended conservative management. Received 48 hours of IV heparin, Transitioning back to Xarelto, Heparininfusion stopped. - pt already on statin and coreg, Plavix  Acute on Chronic combined systolic and diastolic CHF Acute hypoxic respiratory failure. No evidence of active pneumonia. No evidence of sepsis. - Echo 02/21/2018and 10/17/2016 with EF 22-02%, grade 2 diastolic CHF - Monitor daily weights, strict I/O Started on IV Lasix 60 mg every 8 hours.Increasing to 80 mg every 8 hours Highly appreciate cardiology input. Underwent right and left thoracentesis for therapeutic purposes.  Fluid analysis suggest transudative consistent with CHF.  Incentive spirometry ordered.Educated patient regarding how to do it. With mild worsening renal function backing off on Lasix to 80 mg twice a day. On oral lasix, continue on discharge, take extra for weight gain.   Metastatic, poorly differentiated gastric adenocarcinoma Goals of care discussion. - With  multiple liver metastases, likely lung metastases, extensive abdominal  adenopathy and stomach mass - Per recent PET scan 10/10/2016 lymph node involvement consistent with progressive metastatic illness - It was determined the patient is not a surgical candidate, patient was started on palliative chemotherapy but cannot tolerate it. Oncology recommended that the patient is not a good candidate for any additional palliative therapy and recommended transition to hospice. Palliative care consulted,  primary focus is symptom control and treat what is treatable with reasonable degree. No aggressive or heroic measures. Family chose their hospice service provider and per request of hospice new script of all his medication was provided.  Extensive bilateral DVT, PE 09/10/2016 - In patient with likely hypercoagulable state likely related to metastatic differentiated gastric adenocarcinoma - On Xarelto,was on hold, now since the heparin drip has been completed with transition to Xarelto again. Complicated history, patient has been on Xarelto in the past and completed 21 day loading but then transitioned to Lovenox. Recently one week ago started back on Xarelto with loading. Given that the patient has remained consistently anticoagulated since diagnosis of DVT in January 2018 it was felt that the patient does not need any more loading of Xarelto and can be transitioned directly to 20 mg daily dose. This was discussed with hematology on call who agrees with the plan. recent antiphospholipid antibodyworkupnegative.  Hyponatremia Getting better. Continue to monitor.  Transaminitis - Likely from liver metastases  All other chronic medical condition were stable during the hospitalization.  Patient was seen by physical therapy, who recommended home health, but family chose to go on hospice, which was arranged by Education officer, museum and case Freight forwarder. On the day of the discharge the patient's vitals were stable, and no other acute medical condition were reported by patient. the  patient was felt safe to be discharge at home with hospice.  Procedures and Results:  Right and left thoracentesis  Echocardiogram    Consultations:  Cardiology  Oncology  IR  Palliative care  DISCHARGE MEDICATION: Discharge Medication List as of 01/03/2017 12:44 PM    START taking these medications   Details  furosemide (LASIX) 40 MG tablet Take 2 tablets (80 mg total) by mouth 2 (two) times daily. For 3 days then take 40 mg twice a day.Take 40mg  extra for weight gain of 3LBS in 1 day, Starting Tue 01/03/2017, Print      CONTINUE these medications which have CHANGED   Details  atorvastatin (LIPITOR) 80 MG tablet Take 1 tablet (80 mg total) by mouth every evening., Starting Tue 01/03/2017, Print    carvedilol (COREG) 6.25 MG tablet Take 1 tablet (6.25 mg total) by mouth 2 (two) times daily., Starting Tue 01/03/2017, Print    clopidogrel (PLAVIX) 75 MG tablet Take 1 tablet (75 mg total) by mouth daily., Starting Tue 01/03/2017, Print    dexamethasone (DECADRON) 4 MG tablet Take 1 tablet (4 mg total) by mouth daily with breakfast., Starting Tue 01/03/2017, Print    escitalopram (LEXAPRO) 20 MG tablet Take 1 tablet (20 mg total) by mouth daily., Starting Tue 01/03/2017, Print    mirtazapine (REMERON SOL-TAB) 15 MG disintegrating tablet Take 1 tablet (15 mg total) by mouth at bedtime., Starting Tue 01/03/2017, Print    nitroGLYCERIN (NITROSTAT) 0.4 MG SL tablet Place 1 tablet (0.4 mg total) under the tongue every 5 (five) minutes as needed for chest pain (up to 3 doses)., Starting Tue 01/03/2017, Print    omeprazole (PRILOSEC) 40 MG capsule Take 1 capsule (40 mg total)  by mouth daily., Starting Tue 01/03/2017, Print    ondansetron (ZOFRAN) 4 MG tablet Take 1 tablet (4 mg total) by mouth every 8 (eight) hours as needed for nausea., Starting Tue 01/03/2017, Print    polyethylene glycol (MIRALAX / GLYCOLAX) packet Take 17 g by mouth 2 (two) times daily., Starting Tue 01/03/2017, Print      rivaroxaban (XARELTO) 20 MG TABS tablet Take 1 tablet (20 mg total) by mouth daily with breakfast., Starting Tue 01/03/2017, Print    senna-docusate (SENNA S) 8.6-50 MG tablet Take 2 tablets by mouth at bedtime., Starting Tue 01/03/2017, Print    traMADol (ULTRAM) 50 MG tablet Take 1 tablet (50 mg total) by mouth every 6 (six) hours as needed for moderate pain or severe pain., Starting Tue 01/03/2017, Print      STOP taking these medications     capecitabine (XELODA) 500 MG tablet      morphine (MSIR) 15 MG tablet      pantoprazole (PROTONIX) 40 MG tablet      Rivaroxaban (XARELTO STARTER PACK) 15 & 20 MG TBPK        No Known Allergies Discharge Instructions    Diet - low sodium heart healthy    Complete by:  As directed    Discharge instructions    Complete by:  As directed    It is important that you read following instructions as well as go over your medication list with RN to help you understand your care after this hospitalization.  Discharge Instructions: Please follow-up with PCP in one week  Please request your primary care physician to go over all Hospital Tests and Procedure/Radiological results at the follow up,  Please get all Hospital records sent to your PCP by signing hospital release before you go home.   Do not take more than prescribed Pain, Sleep and Anxiety Medications. You were cared for by a hospitalist during your hospital stay. If you have any questions about your discharge medications or the care you received while you were in the hospital after you are discharged, you can call the unit and ask to speak with the hospitalist on call if the hospitalist that took care of you is not available.  Once you are discharged, your primary care physician will handle any further medical issues. Please note that NO REFILLS for any discharge medications will be authorized once you are discharged, as it is imperative that you return to your primary care physician (or  establish a relationship with a primary care physician if you do not have one) for your aftercare needs so that they can reassess your need for medications and monitor your lab values. You Must read complete instructions/literature along with all the possible adverse reactions/side effects for all the Medicines you take and that have been prescribed to you. Take any new Medicines after you have completely understood and accept all the possible adverse reactions/side effects. Wear Seat belts while driving. If you have smoked or chewed Tobacco in the last 2 yrs please stop smoking and/or stop any Recreational drug use.   Increase activity slowly    Complete by:  As directed      Discharge Exam: Filed Weights   01/01/17 0415 01/02/17 0605 01/03/17 0300  Weight: 99.9 kg (220 lb 3.2 oz) 99.2 kg (218 lb 12.8 oz) 97.6 kg (215 lb 3.2 oz)   Vitals:   01/02/17 1900 01/03/17 0300  BP: 103/71 111/70  Pulse: 83 90  Resp: 16 16  Temp: 97.8  F (36.6 C) 98 F (36.7 C)   General: Appear in no distress, no Rash; Oral Mucosa moist. Cardiovascular: S1 and S2 Present, no Murmur, no JVD Respiratory: Bilateral Air entry present and Clear to Auscultation, no Crackles, no wheezes Abdomen: Bowel Sound present, Soft and no tenderness Extremities: no Pedal edema, no calf tenderness Neurology: Grossly no focal neuro deficit.  The results of significant diagnostics from this hospitalization (including imaging, microbiology, ancillary and laboratory) are listed below for reference.    Significant Diagnostic Studies: Dg Chest 1 View  Result Date: 12/31/2016 CLINICAL DATA:  66 year old male status post left thoracentesis EXAM: CHEST 1 VIEW COMPARISON:  Pre thoracentesis chest x-ray 12/30/2016 FINDINGS: No evidence of a pneumothorax. Significant interval improvement in left pleural effusion. Persistent mild bibasilar atelectasis. Stable cardiomegaly. No acute osseous abnormality. IMPRESSION: No evidence of  pneumothorax following left-sided thoracentesis. Electronically Signed   By: Jacqulynn Cadet M.D.   On: 12/31/2016 13:03   Dg Chest 1 View  Result Date: 12/30/2016 CLINICAL DATA:  Thoracentesis on the right. EXAM: CHEST 1 VIEW COMPARISON:  Chest x-ray from yesterday FINDINGS: No residual pleural fluid on the right. No pneumothorax or re-expansion edema. Layering left pleural effusion. Cardiomegaly and pulmonary vascular congestion. IMPRESSION: No acute finding after right thoracentesis. No residual right pleural effusion. Cardiomegaly and pulmonary vascular congestion. Electronically Signed   By: Monte Fantasia M.D.   On: 12/30/2016 16:05   Ct Angio Chest Pe W/cm &/or Wo Cm  Result Date: 12/29/2016 CLINICAL DATA:  Chest pain, shortness of Breath EXAM: CT ANGIOGRAPHY CHEST WITH CONTRAST TECHNIQUE: Multidetector CT imaging of the chest was performed using the standard protocol during bolus administration of intravenous contrast. Multiplanar CT image reconstructions and MIPs were obtained to evaluate the vascular anatomy. CONTRAST:  75 cc Isovue 370 IV COMPARISON:  Chest x-ray earlier today. FINDINGS: Cardiovascular: There appears to be a filling defect in a right lower lobe pulmonary artery on image 76. However, this has a fluid fluid meniscus appearance and may represent posteriorly layering contrast. No other filling defects in the pulmonary arteries. Heart is enlarged. Mediastinum/Nodes: Enlarging posterior mediastinal lymph node adjacent to the distal esophagus on image 91, measuring up to 3.2 cm. Scattered borderline mediastinal lymph nodes elsewhere throughout the mediastinum. No axillary or hilar adenopathy. Lungs/Pleura: Large bilateral pleural effusions. Bilateral lower lobe airspace opacities could reflect pneumonia. Upper Abdomen: Numerous enlarged lymph nodes in the upper abdomen in the region of the gastrohepatic ligament. There are low-density lesions scattered throughout the liver concerning  for metastases. Musculoskeletal: Chest wall soft tissues are unremarkable. No acute bony abnormality. Review of the MIP images confirms the above findings. IMPRESSION: There is apparent filling defect in a right lower lobe pulmonary arterial branch, but this appears to be a fluid fluid level of posteriorly layering contrast not felt represent a pulmonary embolus. Cardiomegaly. Large bilateral pleural effusions. Bilateral lower lobe atelectasis or pneumonia. Extensive upper abdominal and posterior mediastinal adenopathy. These lymph nodes have enlarged since prior study. In addition, there are numerous low-density lesions now noted in the liver concerning for metastases. Electronically Signed   By: Rolm Baptise M.D.   On: 12/29/2016 11:31   Dg Chest Port 1 View  Result Date: 12/29/2016 CLINICAL DATA:  Chest pain for 3 days EXAM: PORTABLE CHEST 1 VIEW COMPARISON:  10/11/2016 FINDINGS: Cardiomegaly with bilateral interstitial and alveolar opacities in the mid and lower lungs. Possible small effusions. Findings likely reflect mild edema/ CHF. IMPRESSION: Mild CHF.  Possible small effusions. Electronically  Signed   By: Rolm Baptise M.D.   On: 12/29/2016 10:24   Ir Thoracentesis Asp Pleural Space W/img Guide  Result Date: 12/30/2016 INDICATION: Gastric cancer. Congestive heart failure. Dyspnea. Bilateral pleural effusions on CT scan. Request for diagnostic and therapeutic right thoracentesis. EXAM: ULTRASOUND GUIDED RIGHT THORACENTESIS LIMITED ULTRASOUND OF THE LEFT CHEST MEDICATIONS: 1% Lidocaine = 16 mL. COMPLICATIONS: None immediate. PROCEDURE: An ultrasound guided thoracentesis was thoroughly discussed with the patient and questions answered. The benefits, risks, alternatives and complications were also discussed. The patient understands and wishes to proceed with the procedure. Written consent was obtained. Ultrasound was performed to localize and mark an adequate pocket of fluid in the right chest. The area  was then prepped and draped in the normal sterile fashion. 1% Lidocaine was used for local anesthesia. Under ultrasound guidance a 6 Fr Safe-T-Centesis catheter was introduced. Thoracentesis was performed. The catheter was removed and a dressing applied. FINDINGS: A total of approximately 1.2 liters of clear orange fluid was removed. Samples were sent to the laboratory as requested by the clinical team. Large pleural effusion on the left. IMPRESSION: Successful ultrasound guided right thoracentesis yielding 1.2 liters of pleural fluid. Recommend ultrasound guided left thoracentesis. Read by:  Gareth Eagle, PA-C Electronically Signed   By: Jacqulynn Cadet M.D.   On: 12/30/2016 15:53   US Thoracentesis Asp Pleural Space W/img Guide  Result Date: 12/31/2016 INDICATION: Symptomatic left sided pleural effusion EXAM: US THORACENTESIS ASP PLEURAL SPACE W/IMG GUIDE COMPARISON:  None MEDICATIONS: 10 cc 1% lidocaine. COMPLICATIONS: None immediate. TECHNIQUE: Informed written consent was obtained from the patient after a discussion of the risks, benefits and alternatives to treatment. A timeout was performed prior to the initiation of the procedure. Initial ultrasound scanning demonstrates a left pleural effusion. The lower chest was prepped and draped in the usual sterile fashion. 1% lidocaine was used for local anesthesia. Under direct ultrasound guidance, a 19 gauge, 7-cm, Yueh catheter was introduced. An ultrasound image was saved for documentation purposes. The thoracentesis was performed. The catheter was removed and a dressing was applied. The patient tolerated the procedure well without immediate post procedural complication. The patient was escorted to have an upright chest radiograph. FINDINGS: A total of approximately 700 cc of yellow fluid was removed. Requested samples were sent to the laboratory. IMPRESSION: Successful ultrasound-guided left sided thoracentesis yielding 700 cc of pleural fluid. Read by  Lavonia Drafts [facility] Electronically Signed   By: Jacqulynn Cadet M.D.   On: 12/31/2016 12:53    Microbiology: Recent Results (from the past 240 hour(s))  Culture, blood (routine x 2)     Status: None   Collection Time: 12/29/16 10:12 AM  Result Value Ref Range Status   Specimen Description BLOOD RIGHT ARM  Final   Special Requests   Final    BOTTLES DRAWN AEROBIC AND ANAEROBIC Blood Culture adequate volume   Culture   Final    NO GROWTH 5 DAYS Performed at [facility], [address] [place], [place] [address]    Report Status 01/03/2017 FINAL  Final  Culture, blood (routine x 2)     Status: None   Collection Time: 12/29/16 10:46 AM  Result Value Ref Range Status   Specimen Description BLOOD RIGHT FOREARM  Final   Special Requests   Final    BOTTLES DRAWN AEROBIC AND ANAEROBIC Blood Culture adequate volume   Culture   Final    NO GROWTH 5 DAYS Performed at [facility]  Lab, [address] [place], [place] [address]    Report Status 01/03/2017 FINAL  Final  MRSA PCR Screening     Status: None   Collection Time: 12/29/16  2:00 PM  Result Value Ref Range Status   MRSA by PCR NEGATIVE NEGATIVE Final    Comment:        The GeneXpert MRSA Assay (FDA approved for NASAL specimens only), is one component of a comprehensive MRSA colonization surveillance program. It is not intended to diagnose MRSA infection nor to guide or monitor treatment for MRSA infections.   Urine culture     Status: None   Collection Time: 12/29/16  8:39 PM  Result Value Ref Range Status   Specimen Description URINE, CLEAN CATCH  Final   Special Requests NONE  Final   Culture NO GROWTH  Final   Report Status 12/31/2016 FINAL  Final  Gram stain     Status: None   Collection Time: 12/30/16  3:57 PM  Result Value Ref Range Status   Specimen Description FLUID  Final   Special Requests RIGHT PLEURAL  Final   Gram Stain   Final    RARE WBC PRESENT, PREDOMINANTLY MONONUCLEAR NO ORGANISMS  SEEN    Report Status 12/30/2016 FINAL  Final  Culture, body fluid-bottle     Status: None (Preliminary result)   Collection Time: 12/30/16  3:57 PM  Result Value Ref Range Status   Specimen Description FLUID  Final   Special Requests RIGHT PLEURAL  Final   Culture NO GROWTH 4 DAYS  Final   Report Status PENDING  Incomplete     Labs: CBC:  Recent Labs Lab 12/29/16 1006  12/30/16 0253 12/31/16 0412 01/01/17 0446 01/02/17 0645 01/03/17 0438  WBC 8.8  --  9.5 6.0 11.3* 16.1* 15.3*  NEUTROABS 6.3  --   --   --   --   --   --   HGB 11.3*  < > 11.1* 10.7* 11.0* 11.8* 11.6*  HCT 34.4*  < > 34.8* 33.9* 34.0* 36.7* 36.6*  MCV 81.9  --  83.1 82.9 84.6 85.2 84.9  PLT 215  --  220 225 234 245 266  < > = values in this interval not displayed. Basic Metabolic Panel:  Recent Labs Lab 12/30/16 0253 12/31/16 0739 01/01/17 0446 01/02/17 0645 01/03/17 0438  NA 128* 130* 132* 135 134*  K 4.7 4.6 4.3 4.0 4.2  CL 95* 94* 96* 97* 97*  CO2 24 24 25 22 24   GLUCOSE 138* 171* 153* 132* 140*  BUN 32* 36* 36* 42* 47*  CREATININE 1.35* 1.18 1.19 1.25* 1.25*  CALCIUM 9.1 9.3 9.1 8.9 9.1  MG  --  2.3 2.2 2.0 2.3  PHOS  --  4.9* 3.7 3.9 3.6   Liver Function Tests:  Recent Labs Lab 12/29/16 1006 12/31/16 0739 01/01/17 0446 01/02/17 0645 01/03/17 0438  AST 72*  --   --   --   --   ALT 68*  --   --   --   --   ALKPHOS 126  --   --   --   --   BILITOT 1.1  --   --   --   --   PROT 6.6  --   --   --   --   ALBUMIN 3.6 3.2* 3.1* 3.4* 3.3*    Recent Labs Lab 12/29/16 1006  LIPASE 22   No results for input(s): AMMONIA in the last 168 hours. Cardiac  Enzymes:  Recent Labs Lab 12/29/16 1042 12/30/16 1210 12/30/16 1604 12/30/16 2222  TROPONINI 1.70* 1.43* 1.44* 1.01*   BNP (last 3 results)  Recent Labs  12/29/16 1006  BNP 1,472.0*   CBG:  Recent Labs Lab 12/29/16 1344  GLUCAP 124*   Time spent: 35 minutes  Signed:  Asusena Sigley  [facility] 01/03/2017  , 1:18 PM

## 2017-01-06 ENCOUNTER — Encounter (HOSPITAL_COMMUNITY): Admission: RE | Admit: 2017-01-06 | Payer: Medicare Other | Source: Ambulatory Visit

## 2017-01-09 ENCOUNTER — Encounter (HOSPITAL_COMMUNITY): Payer: Medicare Other

## 2017-01-11 ENCOUNTER — Encounter (HOSPITAL_COMMUNITY): Payer: Medicare Other

## 2017-01-12 ENCOUNTER — Ambulatory Visit: Payer: Medicare Other

## 2017-01-12 ENCOUNTER — Other Ambulatory Visit: Payer: Medicare Other

## 2017-01-12 NOTE — Progress Notes (Signed)
Cardiac Individual Treatment Plan  Patient Details  Name: [name] MRN: [id number] Date of Birth: Mar 06, 1951 Referring Provider:     CARDIAC REHAB PHASE II ORIENTATION from 11/23/2016 in [place]  Referring Provider  Dr. Tamala Julian      Initial Encounter Date:    CARDIAC REHAB PHASE II ORIENTATION from 11/23/2016 in [place]  Date  11/23/16  Referring Provider  Dr. Tamala Julian      Visit Diagnosis: NSTEMI (non-ST elevated myocardial infarction) [facility])  Status post coronary artery stent placement  Patient's Home Medications on Admission:  Current Outpatient Prescriptions:  .  atorvastatin (LIPITOR) 80 MG tablet, Take 1 tablet (80 mg total) by mouth every evening., Disp: 90 tablet, Rfl: 0 .  carvedilol (COREG) 6.25 MG tablet, Take 1 tablet (6.25 mg total) by mouth 2 (two) times daily., Disp: 180 tablet, Rfl: 3 .  clopidogrel (PLAVIX) 75 MG tablet, Take 1 tablet (75 mg total) by mouth daily., Disp: 90 tablet, Rfl: 0 .  dexamethasone (DECADRON) 4 MG tablet, Take 1 tablet (4 mg total) by mouth daily with breakfast., Disp: 10 tablet, Rfl: 0 .  escitalopram (LEXAPRO) 20 MG tablet, Take 1 tablet (20 mg total) by mouth daily., Disp: 90 tablet, Rfl: 0 .  furosemide (LASIX) 40 MG tablet, Take 2 tablets (80 mg total) by mouth 2 (two) times daily. For 3 days then take 40 mg twice a day.Take 40mg  extra for weight gain of 3LBS in 1 day, Disp: 60 tablet, Rfl: 0 .  mirtazapine (REMERON SOL-TAB) 15 MG disintegrating tablet, Take 1 tablet (15 mg total) by mouth at bedtime., Disp: 30 tablet, Rfl: 0 .  nitroGLYCERIN (NITROSTAT) 0.4 MG SL tablet, Place 1 tablet (0.4 mg total) under the tongue every 5 (five) minutes as needed for chest pain (up to 3 doses)., Disp: 25 tablet, Rfl: 0 .  omeprazole (PRILOSEC) 40 MG capsule, Take 1 capsule (40 mg total) by mouth daily., Disp: 30 capsule, Rfl: 0 .  ondansetron (ZOFRAN) 4 MG tablet, Take 1 tablet (4 mg total) by mouth  every 8 (eight) hours as needed for nausea., Disp: 30 tablet, Rfl: 0 .  polyethylene glycol (MIRALAX / GLYCOLAX) packet, Take 17 g by mouth 2 (two) times daily., Disp: 14 each, Rfl: 0 .  rivaroxaban (XARELTO) 20 MG TABS tablet, Take 1 tablet (20 mg total) by mouth daily with breakfast., Disp: 30 tablet, Rfl: 0 .  senna-docusate (SENNA S) 8.6-50 MG tablet, Take 2 tablets by mouth at bedtime., Disp: 60 tablet, Rfl: 1 .  traMADol (ULTRAM) 50 MG tablet, Take 1 tablet (50 mg total) by mouth every 6 (six) hours as needed for moderate pain or severe pain., Disp: 30 tablet, Rfl: 0  Past Medical History: Past Medical History:  Diagnosis Date  . Anginal pain ([place])   . CAD in native artery    a. STEMI 09/2016 s/p DES to mLAD, residual 65-75% OM1 disease treated medically. EF 30-35% by cath, 35-40% by echo at dc.  . Chronic systolic CHF (congestive heart failure) ([place])    a. Dx 09/2016 - EF 30-35% by cath, 35-40% by echo.  . Depression   . Dilatation of aorta ([place])    a. CT 09/2016: dilation of the ascending thoracic aorta measuring 4.4 cm in maximum diameter, recommend f/u 2021 if appropriate.  [name] Kitchen DVT (deep venous thrombosis) ([place])    a. Bilat DVT noted 08/2016.  . Essential hypertension   . GERD (gastroesophageal reflux disease)   .  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 History of stroke    a. 09/2016.  [name] Kitchen Hypercoagulable state ([place])   . Hyperlipidemia   . Hyponatremia   . Ischemic cardiomyopathy   . Lymphadenopathy    a. Abd lymphadenopathy concerning for malignancy noted in 09/2016.  [name] Kitchen Normocytic anemia   . PE (pulmonary thromboembolism) ([place])    a. Bilat PE 08/2016.  . Right leg weakness    secondary to stroke  . Sleep apnea   . Stroke ([place])   . Suspected sleep apnea     Tobacco Use: History  Smoking Status  . Never Smoker  Smokeless Tobacco  . Never Used    Labs: Recent Review Flowsheet Data    Labs for ITP Cardiac and Pulmonary Rehab Latest Ref Rng & Units 07/25/2016 09/26/2016  10/11/2016 12/29/2016   Cholestrol 0 - 200 mg/dL 134 95 131 -   LDLCALC 0 - 99 mg/dL 73 49 68 -   HDL >40 mg/dL 41 29(L) 37(L) -   Trlycerides <150 mg/dL 98 86 131 -   Hemoglobin A1c 4.8 - 5.6 % - 5.3 - -   TCO2 0 - 100 mmol/L - - - 23      Capillary Blood Glucose: Lab Results  Component Value Date   GLUCAP 124 (H) 12/29/2016   GLUCAP 129 (H) 10/15/2016   GLUCAP 106 (H) 10/10/2016   GLUCAP 101 (H) 09/25/2016   GLUCAP 105 (H) 09/14/2016     Exercise Target Goals:    Exercise Program Goal: Individual exercise prescription set with THRR, safety & activity barriers. Participant demonstrates ability to understand and report RPE using BORG scale, to self-measure pulse accurately, and to acknowledge the importance of the exercise prescription.  Exercise Prescription Goal: Starting with aerobic activity 30 plus minutes a day, 3 days per week for initial exercise prescription. Provide home exercise prescription and guidelines that participant acknowledges understanding prior to discharge.  Activity Barriers & Risk Stratification:     Activity Barriers & Cardiac Risk Stratification - 11/23/16 1219      Activity Barriers & Cardiac Risk Stratification   Activity Barriers Other (comment)   Comments Has had a stroke that has affected his right leg. Mobility is fair to good.    Cardiac Risk Stratification High      6 Minute Walk:     6 Minute Walk    Row Name 11/23/16 1205         6 Minute Walk   Phase Initial     Distance 1200 feet     Distance % Change 0 %     Walk Time 6 minutes     # of Rest Breaks 0     MPH 2.27     METS 2.74     RPE 15     Perceived Dyspnea  11     VO2 Peak 9.57     Symptoms No     Resting HR 69 bpm     Resting BP 92/60     Max Ex. HR 104 bpm     Max Ex. BP 118/68     2 Minute Post BP 106/64        Oxygen Initial Assessment:   Oxygen Re-Evaluation:   Oxygen Discharge (Final Oxygen Re-Evaluation):   Initial Exercise Prescription:      Initial Exercise Prescription - 11/23/16 1200      Date of Initial Exercise RX and Referring Provider   Date 11/23/16  Referring Provider Dr. Tamala Julian     NuStep   Level 2   SPM 17   Minutes 15   METs 1.9     Recumbant Elliptical   Level 1   RPM 31   Watts 21   Minutes 20   METs 1.4     Prescription Details   Frequency (times per week) 3   Duration Progress to 30 minutes of continuous aerobic without signs/symptoms of physical distress     Intensity   THRR 40-80% of Max Heartrate [phone]   Ratings of Perceived Exertion 11-13   Perceived Dyspnea 0-4     Progression   Progression Continue progressive overload as per policy without signs/symptoms or physical distress.     Resistance Training   Training Prescription Yes   Weight 1   Reps 10-15      Perform Capillary Blood Glucose checks as needed.  Exercise Prescription Changes:      Exercise Prescription Changes    Row Name 12/12/16 1400             Response to Exercise   Blood Pressure (Admit) 92/62       Blood Pressure (Exercise) 102/62       Blood Pressure (Exit) 88/60       Heart Rate (Admit) 75 bpm       Heart Rate (Exercise) 86 bpm       Heart Rate (Exit) 79 bpm       Rating of Perceived Exertion (Exercise) 11       Duration Progress to 30 minutes of  aerobic without signs/symptoms of physical distress       Intensity THRR unchanged         Progression   Progression Continue to progress workloads to maintain intensity without signs/symptoms of physical distress.         Resistance Training   Training Prescription Yes       Weight 1       Reps 10-15         NuStep   Level 1       SPM 11       Minutes 15       METs 3.69         Arm Ergometer   Level 1       Watts 1       Minutes 20       METs 1         Home Exercise Plan   Plans to continue exercise at Home (comment)       Frequency Add 1 additional day to program exercise sessions.          Exercise Comments:       Exercise Comments    Row Name 12/12/16 1417 01/06/17 1441         Exercise Comments Patient has not progressed in Cr and has been placed on arm ergometer due to illness.  Patient has noit progressed due to lack of attendance          Exercise Goals and Review:      Exercise Goals    Row Name 11/23/16 1221             Exercise Goals   Increase Physical Activity Yes       Intervention Provide advice, education, support and counseling about physical activity/exercise needs.;Develop an individualized exercise prescription for aerobic and resistive training based on initial evaluation findings, risk stratification, comorbidities and participant's personal  goals.       Expected Outcomes Achievement of increased cardiorespiratory fitness and enhanced flexibility, muscular endurance and strength shown through measurements of functional capacity and personal statement of participant.       Increase Strength and Stamina Yes       Intervention Develop an individualized exercise prescription for aerobic and resistive training based on initial evaluation findings, risk stratification, comorbidities and participant's personal goals.;Provide advice, education, support and counseling about physical activity/exercise needs.          Exercise Goals Re-Evaluation :    Discharge Exercise Prescription (Final Exercise Prescription Changes):     Exercise Prescription Changes - 12/12/16 1400      Response to Exercise   Blood Pressure (Admit) 92/62   Blood Pressure (Exercise) 102/62   Blood Pressure (Exit) 88/60   Heart Rate (Admit) 75 bpm   Heart Rate (Exercise) 86 bpm   Heart Rate (Exit) 79 bpm   Rating of Perceived Exertion (Exercise) 11   Duration Progress to 30 minutes of  aerobic without signs/symptoms of physical distress   Intensity THRR unchanged     Progression   Progression Continue to progress workloads to maintain intensity without signs/symptoms of physical distress.      Resistance Training   Training Prescription Yes   Weight 1   Reps 10-15     NuStep   Level 1   SPM 11   Minutes 15   METs 3.69     Arm Ergometer   Level 1   Watts 1   Minutes 20   METs 1     Home Exercise Plan   Plans to continue exercise at Home (comment)   Frequency Add 1 additional day to program exercise sessions.      Nutrition:  Target Goals: Understanding of nutrition guidelines, daily intake of sodium 1500mg , cholesterol 200mg , calories 30% from fat and 7% or less from saturated fats, daily to have 5 or more servings of fruits and vegetables.  Biometrics:      Post Biometrics - 11/23/16 1207       Post  Biometrics   Height 6\' 1"  (1.854 m)   Weight 229 lb 0.9 oz (103.9 kg)   Waist Circumference 40 inches   Hip Circumference 43 inches   Waist to Hip Ratio 0.93 %   BMI (Calculated) 30.3   Triceps Skinfold 16 mm   % Body Fat 28.1 %   Grip Strength 78 kg   Flexibility 13.83 in   Single Leg Stand 60 seconds      Nutrition Therapy Plan and Nutrition Goals:   Nutrition Discharge: Rate Your Plate Scores:   Nutrition Goals Re-Evaluation:   Nutrition Goals Discharge (Final Nutrition Goals Re-Evaluation):   Psychosocial: Target Goals: Acknowledge presence or absence of significant depression and/or stress, maximize coping skills, provide positive support system. Participant is able to verbalize types and ability to use techniques and skills needed for reducing stress and depression.  Initial Review & Psychosocial Screening:     Initial Psych Review & Screening - 11/23/16 1223      Initial Review   Current issues with None Identified     Family Dynamics   Good Support System? Yes     Barriers   Psychosocial barriers to participate in program There are no identifiable barriers or psychosocial needs.     Screening Interventions   Interventions Encouraged to exercise      Quality of Life Scores:     Quality of Life -  11/23/16 1208       Quality of Life Scores   Health/Function Pre 23.27 %   Socioeconomic Pre 24.19 %   Psych/Spiritual Pre 20.29 %   Family Pre 21 %   GLOBAL Pre 22.6 %      PHQ-9: Recent Review Flowsheet Data    Depression screen [facility] 2/9 11/23/2016 09/16/2016 09/10/2016 07/25/2016 07/04/2016   Decreased Interest 0 0 0 0 0   Down, Depressed, Hopeless 0 0 0 0 0   PHQ - 2 Score 0 0 0 0 0   Altered sleeping 0 - - - -   Tired, decreased energy 0 - - - -   Change in appetite 1 - - - -   Feeling bad or failure about yourself  0 - - - -   Trouble concentrating 0 - - - -   Moving slowly or fidgety/restless 0 - - - -   PHQ-9 Score 1 - - - -   Difficult doing work/chores Not difficult at all - - - -     Interpretation of Total Score  Total Score Depression Severity:  1-4 = Minimal depression, 5-9 = Mild depression, 10-14 = Moderate depression, 15-19 = Moderately severe depression, 20-27 = Severe depression   Psychosocial Evaluation and Intervention:     Psychosocial Evaluation - 11/23/16 1223      Psychosocial Evaluation & Interventions   Interventions Encouraged to exercise with the program and follow exercise prescription   Continue Psychosocial Services  No Follow up required      Psychosocial Re-Evaluation:   Psychosocial Discharge (Final Psychosocial Re-Evaluation):   Vocational Rehabilitation: Provide vocational rehab assistance to qualifying candidates.   Vocational Rehab Evaluation & Intervention:     Vocational Rehab - 11/23/16 1216      Initial Vocational Rehab Evaluation & Intervention   Assessment shows need for Vocational Rehabilitation No      Education: Education Goals: Education classes will be provided on a weekly basis, covering required topics. Participant will state understanding/return demonstration of topics presented.  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 Discus how high blood pressure affects the body and ways to reduce high blood pressure.   Exercise and Your Heart -Discuss why it is important to exercise, the FITT principles of exercise, normal and abnormal responses to exercise, and how to exercise safely.   Angina -Discuss definition of angina, causes of angina, treatment of angina, and how to decrease risk of having angina.   Cardiac Medications -Review what the following cardiac medications are used for, how they affect the body, and side effects that may occur when taking the medications.  Medications include Aspirin, Beta blockers, calcium channel blockers, ACE Inhibitors, angiotensin receptor blockers, diuretics, digoxin, and antihyperlipidemics.   Congestive Heart Failure -Discuss the definition of CHF, how to live with CHF, the signs and symptoms of CHF, and how keep track of weight and sodium intake.   Heart Disease and Intimacy -Discus the effect sexual activity has on the heart, how changes occur during intimacy as we age, and safety during sexual activity.   Smoking Cessation / COPD -Discuss different methods to quit smoking, the health benefits of quitting smoking, and the definition of COPD.  Nutrition I: Fats -Discuss the types of cholesterol, what cholesterol does to the heart, and how cholesterol levels can be controlled.   Nutrition II: Labels -Discuss the different components of food labels and how to read food label   Heart Parts and Heart Disease -Discuss the anatomy of the heart, the pathway of blood circulation through the heart, and these are affected by heart disease.   Stress I: Signs and Symptoms -Discuss the causes of stress, how stress may lead to anxiety and depression, and  ways to limit stress.   Stress II: Relaxation -Discuss different types of relaxation techniques to limit stress.   CARDIAC REHAB PHASE II EXERCISE from 12/07/2016 in [place]  Date  12/07/16  Educator  DJ  Instruction Review Code  2- meets goals/outcomes      Warning Signs of Stroke / TIA -Discuss definition of a stroke, what the signs and symptoms are of a stroke, and how to identify when someone is having stroke.   Knowledge Questionnaire Score:     Knowledge Questionnaire Score - 11/23/16 1216      Knowledge Questionnaire Score   Pre Score 23/24      Core Components/Risk Factors/Patient Goals at Admission:     Personal Goals and Risk Factors at Admission - 11/23/16 1221      Core Components/Risk Factors/Patient Goals on Admission    Weight Management Weight Maintenance   Personal Goal Other Yes   Personal Goal Improvement of my health, get back to playing golf   Intervention Attend CR 3 x week and supplement exercise at home 2 x week on his recumbent bike.    Expected Outcomes Achieve his personal goals.       Core Components/Risk Factors/Patient Goals Review:      Goals and Risk Factor Review    Row Name 11/23/16 1223             Core Components/Risk Factors/Patient Goals Review   Personal Goals Review Weight Management/Obesity          Core Components/Risk Factors/Patient Goals at Discharge (Final Review):      Goals and Risk Factor Review - 11/23/16 1223      Core Components/Risk Factors/Patient Goals Review   Personal Goals Review Weight Management/Obesity      ITP Comments:     ITP Comments    Row Name 11/23/16 1232 11/23/16 1247 12/12/16 1508 01/12/17 1621     ITP Comments [name] is a 66 year old male who also had a recent CVA that has affected his (R) leg causing it to be numb. He is able to walk without any assistive devices. He will not be alble to use the treadmill. All other functions have returned.  Cognitively his is a little slow but able to function. 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  Patient new to program. Plans to start Monday 11/28/16. Patient has been recently diagnosed with terminal stomach cancer. Spoke with his wife today. She says she wants to postpone his rehab for 2 weeks until they decide on a plan and see if patient will be able to continue the program.  Patient expired Saturday 2017-02-04.       Comments: Patient stopped coming to Cardiac Rehab on 12/19/16. He expired 02/04/17.

## 2017-01-12 NOTE — Addendum Note (Signed)
Encounter addended by: Dwana Melena, RN on: 01/12/2017  4:26 PM<BR>    Actions taken: Visit diagnoses modified, Visit Navigator Flowsheet section accepted, Sign clinical note, Episode resolved

## 2017-01-12 NOTE — Progress Notes (Signed)
Discharge Summary  Patient Details  Name: [name] MRN: [id number] Date of Birth: 1951/01/11 Referring Provider:     CARDIAC REHAB PHASE II ORIENTATION from 11/23/2016 in [place]  Referring Provider  Dr. Tamala Julian       Number of Visits: 6  Reason for Discharge:  Early Exit:  Patient deceased.   Smoking History:  History  Smoking Status  . Never Smoker  Smokeless Tobacco  . Never Used    Diagnosis:  NSTEMI (non-ST elevated myocardial infarction) ([place])  Status post coronary artery stent placement  ADL UCSD:   Initial Exercise Prescription:     Initial Exercise Prescription - 11/23/16 1200      Date of Initial Exercise RX and Referring Provider   Date 11/23/16   Referring Provider Dr. Tamala Julian     NuStep   Level 2   SPM 17   Minutes 15   METs 1.9     Recumbant Elliptical   Level 1   RPM 31   Watts 21   Minutes 20   METs 1.4     Prescription Details   Frequency (times per week) 3   Duration Progress to 30 minutes of continuous aerobic without signs/symptoms of physical distress     Intensity   THRR 40-80% of Max Heartrate [phone]   Ratings of Perceived Exertion 11-13   Perceived Dyspnea 0-4     Progression   Progression Continue progressive overload as per policy without signs/symptoms or physical distress.     Resistance Training   Training Prescription Yes   Weight 1   Reps 10-15      Discharge Exercise Prescription (Final Exercise Prescription Changes):     Exercise Prescription Changes - 12/12/16 1400      Response to Exercise   Blood Pressure (Admit) 92/62   Blood Pressure (Exercise) 102/62   Blood Pressure (Exit) 88/60   Heart Rate (Admit) 75 bpm   Heart Rate (Exercise) 86 bpm   Heart Rate (Exit) 79 bpm   Rating of Perceived Exertion (Exercise) 11   Duration Progress to 30 minutes of  aerobic without signs/symptoms of physical distress   Intensity THRR unchanged     Progression   Progression  Continue to progress workloads to maintain intensity without signs/symptoms of physical distress.     Resistance Training   Training Prescription Yes   Weight 1   Reps 10-15     NuStep   Level 1   SPM 11   Minutes 15   METs 3.69     Arm Ergometer   Level 1   Watts 1   Minutes 20   METs 1     Home Exercise Plan   Plans to continue exercise at Home (comment)   Frequency Add 1 additional day to program exercise sessions.      Functional Capacity:     6 Minute Walk    Row Name 11/23/16 1205         6 Minute Walk   Phase Initial     Distance 1200 feet     Distance % Change 0 %     Walk Time 6 minutes     # of Rest Breaks 0     MPH 2.27     METS 2.74     RPE 15     Perceived Dyspnea  11     VO2 Peak 9.57     Symptoms No     Resting HR 69 bpm  Resting BP 92/60     Max Ex. HR 104 bpm     Max Ex. BP 118/68     2 Minute Post BP 106/64        Psychological, QOL, Others - Outcomes: PHQ 2/9: Depression screen [facility] 2/9 11/23/2016 09/16/2016 09/10/2016 07/25/2016 07/04/2016  Decreased Interest 0 0 0 0 0  Down, Depressed, Hopeless 0 0 0 0 0  PHQ - 2 Score 0 0 0 0 0  Altered sleeping 0 - - - -  Tired, decreased energy 0 - - - -  Change in appetite 1 - - - -  Feeling bad or failure about yourself  0 - - - -  Trouble concentrating 0 - - - -  Moving slowly or fidgety/restless 0 - - - -  PHQ-9 Score 1 - - - -  Difficult doing work/chores Not difficult at all - - - -    Quality of Life:     Quality of Life - 11/23/16 1208      Quality of Life Scores   Health/Function Pre 23.27 %   Socioeconomic Pre 24.19 %   Psych/Spiritual Pre 20.29 %   Family Pre 21 %   GLOBAL Pre 22.6 %      Personal Goals: Goals established at orientation with interventions provided to work toward goal.     Personal Goals and Risk Factors at Admission - 11/23/16 1221      Core Components/Risk Factors/Patient Goals on Admission    Weight Management Weight Maintenance   Personal  Goal Other Yes   Personal Goal Improvement of my health, get back to playing golf   Intervention Attend CR 3 x week and supplement exercise at home 2 x week on his recumbent bike.    Expected Outcomes Achieve his personal goals.        Personal Goals Discharge:     Goals and Risk Factor Review    Row Name 11/23/16 1223             Core Components/Risk Factors/Patient Goals Review   Personal Goals Review Weight Management/Obesity          Nutrition & Weight - Outcomes:      Post Biometrics - 11/23/16 1207       Post  Biometrics   Height 6\' 1"  (1.854 m)   Weight 229 lb 0.9 oz (103.9 kg)   Waist Circumference 40 inches   Hip Circumference 43 inches   Waist to Hip Ratio 0.93 %   BMI (Calculated) 30.3   Triceps Skinfold 16 mm   % Body Fat 28.1 %   Grip Strength 78 kg   Flexibility 13.83 in   Single Leg Stand 60 seconds      Nutrition:   Nutrition Discharge:   Education Questionnaire Score:     Knowledge Questionnaire Score - 11/23/16 1216      Knowledge Questionnaire Score   Pre Score 23/24

## 2017-01-13 ENCOUNTER — Encounter (HOSPITAL_COMMUNITY): Payer: Medicare Other

## 2017-01-16 ENCOUNTER — Encounter (HOSPITAL_COMMUNITY): Payer: Medicare Other

## 2017-01-18 ENCOUNTER — Encounter (HOSPITAL_COMMUNITY): Payer: Medicare Other

## 2017-01-20 ENCOUNTER — Encounter (HOSPITAL_COMMUNITY): Payer: Medicare Other

## 2017-01-20 DEATH — deceased

## 2017-01-23 ENCOUNTER — Encounter (HOSPITAL_COMMUNITY): Payer: Medicare Other

## 2017-01-25 ENCOUNTER — Encounter (HOSPITAL_COMMUNITY): Payer: Medicare Other

## 2017-01-27 ENCOUNTER — Encounter (HOSPITAL_COMMUNITY): Payer: Medicare Other

## 2017-01-30 ENCOUNTER — Encounter (HOSPITAL_COMMUNITY): Payer: Medicare Other

## 2017-02-01 ENCOUNTER — Encounter (HOSPITAL_COMMUNITY): Payer: Medicare Other

## 2017-02-02 ENCOUNTER — Other Ambulatory Visit: Payer: Medicare Other

## 2017-02-02 ENCOUNTER — Ambulatory Visit: Payer: Medicare Other

## 2017-02-03 ENCOUNTER — Encounter (HOSPITAL_COMMUNITY): Payer: Medicare Other

## 2017-02-06 ENCOUNTER — Encounter (HOSPITAL_COMMUNITY): Payer: Medicare Other

## 2017-02-08 ENCOUNTER — Encounter (HOSPITAL_COMMUNITY): Payer: Medicare Other

## 2017-02-10 ENCOUNTER — Encounter (HOSPITAL_COMMUNITY): Payer: Medicare Other

## 2017-02-13 ENCOUNTER — Encounter (HOSPITAL_COMMUNITY): Payer: Medicare Other

## 2017-02-15 ENCOUNTER — Encounter (HOSPITAL_COMMUNITY): Payer: Medicare Other

## 2017-02-17 ENCOUNTER — Encounter (HOSPITAL_COMMUNITY): Payer: Medicare Other

## 2017-02-20 ENCOUNTER — Encounter (HOSPITAL_COMMUNITY): Payer: Medicare Other

## 2017-02-22 ENCOUNTER — Encounter (HOSPITAL_COMMUNITY): Payer: Medicare Other

## 2017-02-23 ENCOUNTER — Other Ambulatory Visit: Payer: Medicare Other

## 2017-02-23 ENCOUNTER — Ambulatory Visit: Payer: Medicare Other

## 2017-02-24 ENCOUNTER — Encounter (HOSPITAL_COMMUNITY): Payer: Medicare Other

## 2017-02-25 ENCOUNTER — Other Ambulatory Visit: Payer: Self-pay | Admitting: Nurse Practitioner

## 2017-02-27 ENCOUNTER — Encounter (HOSPITAL_COMMUNITY): Payer: Medicare Other

## 2017-03-01 ENCOUNTER — Encounter (HOSPITAL_COMMUNITY): Payer: Medicare Other

## 2017-03-03 ENCOUNTER — Encounter (HOSPITAL_COMMUNITY): Payer: Medicare Other

## 2017-03-06 ENCOUNTER — Encounter (HOSPITAL_COMMUNITY): Payer: Medicare Other

## 2017-03-08 ENCOUNTER — Encounter (HOSPITAL_COMMUNITY): Payer: Medicare Other

## 2017-03-10 ENCOUNTER — Encounter (HOSPITAL_COMMUNITY): Payer: Medicare Other

## 2017-03-13 ENCOUNTER — Encounter (HOSPITAL_COMMUNITY): Payer: Medicare Other

## 2017-03-15 ENCOUNTER — Encounter (HOSPITAL_COMMUNITY): Payer: Medicare Other

## 2017-03-17 ENCOUNTER — Encounter (HOSPITAL_COMMUNITY): Payer: Medicare Other

## 2017-03-20 ENCOUNTER — Encounter (HOSPITAL_COMMUNITY): Payer: Medicare Other

## 2017-03-22 ENCOUNTER — Encounter (HOSPITAL_COMMUNITY): Payer: Medicare Other

## 2017-03-24 ENCOUNTER — Encounter (HOSPITAL_COMMUNITY): Payer: Medicare Other

## 2017-03-27 ENCOUNTER — Encounter (HOSPITAL_COMMUNITY): Payer: Medicare Other

## 2017-03-29 ENCOUNTER — Encounter (HOSPITAL_COMMUNITY): Payer: Medicare Other

## 2017-03-31 ENCOUNTER — Encounter (HOSPITAL_COMMUNITY): Payer: Medicare Other

## 2017-04-07 ENCOUNTER — Encounter (HOSPITAL_COMMUNITY): Payer: Medicare Other

## 2017-04-14 ENCOUNTER — Encounter (HOSPITAL_COMMUNITY): Payer: Medicare Other

## 2017-04-21 ENCOUNTER — Encounter (HOSPITAL_COMMUNITY): Payer: Medicare Other

## 2017-04-28 ENCOUNTER — Encounter (HOSPITAL_COMMUNITY): Payer: Medicare Other

## 2017-05-05 ENCOUNTER — Encounter (HOSPITAL_COMMUNITY): Payer: Medicare Other

## 2017-05-12 ENCOUNTER — Encounter (HOSPITAL_COMMUNITY): Payer: Medicare Other

## 2017-05-19 ENCOUNTER — Encounter (HOSPITAL_COMMUNITY): Payer: Medicare Other

## 2017-05-26 ENCOUNTER — Encounter (HOSPITAL_COMMUNITY): Payer: Medicare Other

## 2017-06-02 ENCOUNTER — Encounter (HOSPITAL_COMMUNITY): Payer: Medicare Other

## 2017-06-09 ENCOUNTER — Encounter (HOSPITAL_COMMUNITY): Payer: Medicare Other

## 2017-06-16 ENCOUNTER — Encounter (HOSPITAL_COMMUNITY): Payer: Medicare Other

## 2017-06-23 ENCOUNTER — Encounter (HOSPITAL_COMMUNITY): Payer: Medicare Other

## 2017-06-30 ENCOUNTER — Encounter (HOSPITAL_COMMUNITY): Payer: Medicare Other

## 2017-07-07 ENCOUNTER — Encounter (HOSPITAL_COMMUNITY): Payer: Medicare Other

## 2017-07-14 ENCOUNTER — Encounter (HOSPITAL_COMMUNITY): Payer: Medicare Other

## 2017-07-21 ENCOUNTER — Encounter (HOSPITAL_COMMUNITY): Payer: Medicare Other

## 2017-10-18 IMAGING — PT NM PET TUM IMG INITIAL (PI) SKULL BASE T - THIGH
1 of 7 series · 1 of 25 positions shown · non-contrast
Comparison: CT 09/28/2016

CLINICAL DATA: Initial treatment strategy for abdominal
lymphadenopathy.

EXAM:
NUCLEAR MEDICINE PET SKULL BASE TO THIGH
TECHNIQUE: 11.6 mCi F-18 FDG was injected intravenously. Full-ring PET imaging
was performed from the skull base to thigh after the radiotracer. CT
data was obtained and used for attenuation correction and anatomic
localization.
FASTING BLOOD GLUCOSE:  Value: 106 mg/dl

[Series 4: ct sk_thigh 5.0 hd_fov · axial · 5.0mm · 1.17mm/px · 1 of 241 slices shown]
[im 241/241  brain]
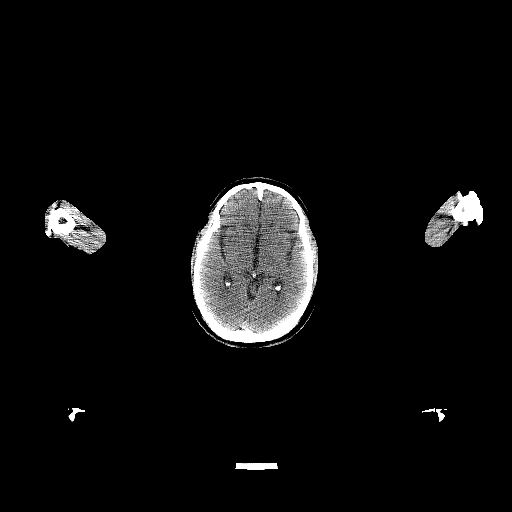

[1 of 25 positions shown; findings below may reference images not displayed]

FINDINGS: NECK

No hypermetabolic lymph nodes in the neck.

CHEST

Hypermetabolic nodule along the distal esophagus with SUV max equal
6.4 just above the GE junction. No additional hypermetabolic
mediastinal adenopathy. No suspicious pulmonary nodules. There are
bilateral small pleural effusions.

ABDOMEN/PELVIS

There are 2 foci of metabolic activity within which appear to be
within the wall of the stomach along the posterior aspect of the
greater curvature. These discrete nodules are intense with SUV max
equal 12.9. Discrete lesions are not evident on the CT portion exam.
No clear lesion on comparison CT.

Adjacent to the stomach within the gastrosplenic ligament there 2
rounded necrotic lymph nodes with SUV max equal 11.5 and measuring
2.2 cm

There is hypermetabolic lymph node in the gastrohepatic ligament
which is similar with SUV max equal 13.5 measuring 2.3 cm.

There are enlarged hypermetabolic porta hepatis lymph nodes which
are similar.

Several upper abdominal retroperitoneal lymph nodes along the aorta
and IVC intense metabolic activity (SUV max equal 12.1).

No hypermetabolic lymph nodes in the pelvis.

SKELETON

No focal hypermetabolic activity to suggest skeletal metastasis.
IMPRESSION: 1. Hypermetabolic gastrohepatic ligament lymph nodes, gastrosplenic
lymph nodes, porta hepatis lymph nodes and distal paraesophageal
lymph nodes consistent with metastatic lymph nodes.
2. Two foci within the wall of the stomach along the posterior
aspect of the greater curvature.
3. Differential for above findings include gastric carcinoma,
esophageal carcinoma, gastrointestinal stromal tumor, or lymphoma.
4. Recommend upper endoscopy / EUS with sampling of the perigastric
lymph nodes and potentially gastric wall lesions if identifiable.
These results will be called to the ordering clinician or
representative by the Radiologist Assistant, and communication
documented in the PACS or zVision Dashboard.
